# Patient Record
Sex: Male | Born: 1940 | Race: White | Hispanic: No | Smoking: Former smoker
Health system: Southern US, Community
[De-identification: ages and names within clinical notes are randomized; demographics above are authoritative.]

## PROBLEM LIST (undated history)

## (undated) DIAGNOSIS — R011 Cardiac murmur, unspecified: Secondary | ICD-10-CM

## (undated) DIAGNOSIS — J449 Chronic obstructive pulmonary disease, unspecified: Secondary | ICD-10-CM

## (undated) DIAGNOSIS — E785 Hyperlipidemia, unspecified: Secondary | ICD-10-CM

## (undated) DIAGNOSIS — E119 Type 2 diabetes mellitus without complications: Secondary | ICD-10-CM

## (undated) DIAGNOSIS — I10 Essential (primary) hypertension: Secondary | ICD-10-CM

## (undated) DIAGNOSIS — Z951 Presence of aortocoronary bypass graft: Secondary | ICD-10-CM

## (undated) DIAGNOSIS — D649 Anemia, unspecified: Secondary | ICD-10-CM

## (undated) DIAGNOSIS — I6529 Occlusion and stenosis of unspecified carotid artery: Secondary | ICD-10-CM

## (undated) DIAGNOSIS — Z862 Personal history of diseases of the blood and blood-forming organs and certain disorders involving the immune mechanism: Secondary | ICD-10-CM

## (undated) DIAGNOSIS — Z872 Personal history of diseases of the skin and subcutaneous tissue: Secondary | ICD-10-CM

## (undated) DIAGNOSIS — I2510 Atherosclerotic heart disease of native coronary artery without angina pectoris: Secondary | ICD-10-CM

## (undated) HISTORY — DX: Cardiac murmur, unspecified: R01.1

## (undated) HISTORY — DX: Hyperlipidemia, unspecified: E78.5

## (undated) HISTORY — PX: TOOTH EXTRACTION: SUR596

## (undated) HISTORY — DX: Personal history of diseases of the skin and subcutaneous tissue: Z87.2

## (undated) HISTORY — DX: Personal history of diseases of the blood and blood-forming organs and certain disorders involving the immune mechanism: Z86.2

## (undated) HISTORY — DX: Occlusion and stenosis of unspecified carotid artery: I65.29

## (undated) HISTORY — DX: Presence of aortocoronary bypass graft: Z95.1

## (undated) HISTORY — DX: Atherosclerotic heart disease of native coronary artery without angina pectoris: I25.10

## (undated) HISTORY — DX: Essential (primary) hypertension: I10

## (undated) HISTORY — DX: Chronic obstructive pulmonary disease, unspecified: J44.9

## (undated) HISTORY — DX: Anemia, unspecified: D64.9

---

## 1974-11-22 HISTORY — PX: TONSILLECTOMY: SUR1361

## 1989-11-22 HISTORY — PX: CHOLECYSTECTOMY: SHX55

## 2010-08-04 DIAGNOSIS — I6529 Occlusion and stenosis of unspecified carotid artery: Secondary | ICD-10-CM

## 2010-08-04 HISTORY — DX: Occlusion and stenosis of unspecified carotid artery: I65.29

## 2010-08-05 ENCOUNTER — Ambulatory Visit: Payer: Self-pay | Admitting: Thoracic Surgery (Cardiothoracic Vascular Surgery)

## 2010-08-05 ENCOUNTER — Inpatient Hospital Stay (HOSPITAL_COMMUNITY): Admission: RE | Admit: 2010-08-05 | Discharge: 2010-08-13 | Payer: Self-pay | Admitting: Cardiovascular Disease

## 2010-08-05 DIAGNOSIS — Z951 Presence of aortocoronary bypass graft: Secondary | ICD-10-CM | POA: Insufficient documentation

## 2010-08-07 ENCOUNTER — Encounter: Payer: Self-pay | Admitting: Thoracic Surgery (Cardiothoracic Vascular Surgery)

## 2010-08-07 DIAGNOSIS — Z951 Presence of aortocoronary bypass graft: Secondary | ICD-10-CM

## 2010-08-07 HISTORY — DX: Presence of aortocoronary bypass graft: Z95.1

## 2010-08-07 HISTORY — PX: CAROTID ENDARTERECTOMY: SUR193

## 2010-08-07 HISTORY — PX: CORONARY ARTERY BYPASS GRAFT: SHX141

## 2010-09-01 ENCOUNTER — Ambulatory Visit: Payer: Self-pay | Admitting: Vascular Surgery

## 2010-09-02 ENCOUNTER — Ambulatory Visit: Payer: Self-pay | Admitting: Surgery

## 2010-09-02 ENCOUNTER — Encounter
Admission: RE | Admit: 2010-09-02 | Discharge: 2010-09-02 | Payer: Self-pay | Admitting: Thoracic Surgery (Cardiothoracic Vascular Surgery)

## 2011-02-04 LAB — GLUCOSE, CAPILLARY
Glucose-Capillary: 112 mg/dL — ABNORMAL HIGH (ref 70–99)
Glucose-Capillary: 117 mg/dL — ABNORMAL HIGH (ref 70–99)
Glucose-Capillary: 119 mg/dL — ABNORMAL HIGH (ref 70–99)
Glucose-Capillary: 124 mg/dL — ABNORMAL HIGH (ref 70–99)
Glucose-Capillary: 127 mg/dL — ABNORMAL HIGH (ref 70–99)
Glucose-Capillary: 131 mg/dL — ABNORMAL HIGH (ref 70–99)
Glucose-Capillary: 145 mg/dL — ABNORMAL HIGH (ref 70–99)
Glucose-Capillary: 146 mg/dL — ABNORMAL HIGH (ref 70–99)
Glucose-Capillary: 167 mg/dL — ABNORMAL HIGH (ref 70–99)
Glucose-Capillary: 191 mg/dL — ABNORMAL HIGH (ref 70–99)

## 2011-02-04 LAB — BASIC METABOLIC PANEL
BUN: 10 mg/dL (ref 6–23)
BUN: 18 mg/dL (ref 6–23)
BUN: 20 mg/dL (ref 6–23)
BUN: 26 mg/dL — ABNORMAL HIGH (ref 6–23)
CO2: 25 mEq/L (ref 19–32)
CO2: 26 mEq/L (ref 19–32)
CO2: 28 mEq/L (ref 19–32)
CO2: 30 mEq/L (ref 19–32)
Calcium: 7.5 mg/dL — ABNORMAL LOW (ref 8.4–10.5)
Calcium: 7.8 mg/dL — ABNORMAL LOW (ref 8.4–10.5)
Calcium: 8.1 mg/dL — ABNORMAL LOW (ref 8.4–10.5)
Calcium: 8.1 mg/dL — ABNORMAL LOW (ref 8.4–10.5)
Chloride: 100 mEq/L (ref 96–112)
Chloride: 104 mEq/L (ref 96–112)
Chloride: 105 mEq/L (ref 96–112)
Chloride: 105 mEq/L (ref 96–112)
Chloride: 108 mEq/L (ref 96–112)
Chloride: 110 mEq/L (ref 96–112)
Creatinine, Ser: 1.11 mg/dL (ref 0.4–1.5)
Creatinine, Ser: 1.15 mg/dL (ref 0.4–1.5)
Creatinine, Ser: 1.18 mg/dL (ref 0.4–1.5)
Creatinine, Ser: 1.55 mg/dL — ABNORMAL HIGH (ref 0.4–1.5)
GFR calc Af Amer: 54 mL/min — ABNORMAL LOW (ref >60)
GFR calc Af Amer: >60 mL/min (ref >60)
GFR calc Af Amer: >60 mL/min (ref >60)
GFR calc Af Amer: >60 mL/min (ref >60)
GFR calc Af Amer: >60 mL/min (ref >60)
GFR calc non Af Amer: 45 mL/min — ABNORMAL LOW (ref >60)
GFR calc non Af Amer: >60 mL/min (ref >60)
GFR calc non Af Amer: >60 mL/min (ref >60)
GFR calc non Af Amer: >60 mL/min (ref >60)
Glucose, Bld: 117 mg/dL — ABNORMAL HIGH (ref 70–99)
Glucose, Bld: 124 mg/dL — ABNORMAL HIGH (ref 70–99)
Glucose, Bld: 141 mg/dL — ABNORMAL HIGH (ref 70–99)
Glucose, Bld: 156 mg/dL — ABNORMAL HIGH (ref 70–99)
Glucose, Bld: 159 mg/dL — ABNORMAL HIGH (ref 70–99)
Potassium: 3.8 mEq/L (ref 3.5–5.1)
Potassium: 3.8 mEq/L (ref 3.5–5.1)
Potassium: 3.9 mEq/L (ref 3.5–5.1)
Potassium: 3.9 mEq/L (ref 3.5–5.1)
Potassium: 4.2 mEq/L (ref 3.5–5.1)
Sodium: 138 mEq/L (ref 135–145)
Sodium: 139 mEq/L (ref 135–145)
Sodium: 140 mEq/L (ref 135–145)
Sodium: 140 mEq/L (ref 135–145)
Sodium: 142 mEq/L (ref 135–145)

## 2011-02-04 LAB — POCT I-STAT 4, (NA,K, GLUC, HGB,HCT)
Glucose, Bld: 101 mg/dL — ABNORMAL HIGH (ref 70–99)
Glucose, Bld: 133 mg/dL — ABNORMAL HIGH (ref 70–99)
HCT: 19 % — ABNORMAL LOW (ref 39.0–52.0)
HCT: 22 % — ABNORMAL LOW (ref 39.0–52.0)
HCT: 30 % — ABNORMAL LOW (ref 39.0–52.0)
Hemoglobin: 11.6 g/dL — ABNORMAL LOW (ref 13.0–17.0)
Hemoglobin: 6.5 g/dL — CL (ref 13.0–17.0)
Hemoglobin: 9.5 g/dL — ABNORMAL LOW (ref 13.0–17.0)
Potassium: 3.7 mEq/L (ref 3.5–5.1)
Potassium: 4.9 mEq/L (ref 3.5–5.1)
Potassium: 5.4 mEq/L — ABNORMAL HIGH (ref 3.5–5.1)
Sodium: 135 mEq/L (ref 135–145)
Sodium: 137 mEq/L (ref 135–145)
Sodium: 138 mEq/L (ref 135–145)

## 2011-02-04 LAB — CBC
HCT: 23.2 % — ABNORMAL LOW (ref 39.0–52.0)
HCT: 24.8 % — ABNORMAL LOW (ref 39.0–52.0)
HCT: 25.3 % — ABNORMAL LOW (ref 39.0–52.0)
HCT: 25.7 % — ABNORMAL LOW (ref 39.0–52.0)
HCT: 26.7 % — ABNORMAL LOW (ref 39.0–52.0)
HCT: 35 % — ABNORMAL LOW (ref 39.0–52.0)
HCT: 36.3 % — ABNORMAL LOW (ref 39.0–52.0)
Hemoglobin: 12 g/dL — ABNORMAL LOW (ref 13.0–17.0)
Hemoglobin: 12.6 g/dL — ABNORMAL LOW (ref 13.0–17.0)
Hemoglobin: 7.8 g/dL — ABNORMAL LOW (ref 13.0–17.0)
Hemoglobin: 8.7 g/dL — ABNORMAL LOW (ref 13.0–17.0)
Hemoglobin: 8.7 g/dL — ABNORMAL LOW (ref 13.0–17.0)
Hemoglobin: 8.8 g/dL — ABNORMAL LOW (ref 13.0–17.0)
Hemoglobin: 9.2 g/dL — ABNORMAL LOW (ref 13.0–17.0)
MCH: 31 pg (ref 26.0–34.0)
MCH: 31.2 pg (ref 26.0–34.0)
MCH: 31.2 pg (ref 26.0–34.0)
MCH: 31.7 pg (ref 26.0–34.0)
MCH: 31.9 pg (ref 26.0–34.0)
MCHC: 33.6 g/dL (ref 30.0–36.0)
MCHC: 33.9 g/dL (ref 30.0–36.0)
MCHC: 34.3 g/dL (ref 30.0–36.0)
MCHC: 34.4 g/dL (ref 30.0–36.0)
MCHC: 34.5 g/dL (ref 30.0–36.0)
MCHC: 35 g/dL (ref 30.0–36.0)
MCV: 89.1 fL (ref 78.0–100.0)
MCV: 90.9 fL (ref 78.0–100.0)
MCV: 91.9 fL (ref 78.0–100.0)
MCV: 92.1 fL (ref 78.0–100.0)
MCV: 92.1 fL (ref 78.0–100.0)
MCV: 92.6 fL (ref 78.0–100.0)
MCV: 92.7 fL (ref 78.0–100.0)
Platelets: 113 10*3/uL — ABNORMAL LOW (ref 150–400)
Platelets: 125 10*3/uL — ABNORMAL LOW (ref 150–400)
Platelets: 132 10*3/uL — ABNORMAL LOW (ref 150–400)
Platelets: 154 10*3/uL (ref 150–400)
Platelets: 164 10*3/uL (ref 150–400)
Platelets: 177 10*3/uL (ref 150–400)
Platelets: 183 10*3/uL (ref 150–400)
RBC: 2.52 MIL/uL — ABNORMAL LOW (ref 4.22–5.81)
RBC: 2.63 MIL/uL — ABNORMAL LOW (ref 4.22–5.81)
RBC: 2.73 MIL/uL — ABNORMAL LOW (ref 4.22–5.81)
RBC: 2.9 MIL/uL — ABNORMAL LOW (ref 4.22–5.81)
RBC: 3.81 MIL/uL — ABNORMAL LOW (ref 4.22–5.81)
RBC: 3.92 MIL/uL — ABNORMAL LOW (ref 4.22–5.81)
RDW: 13.8 % (ref 11.5–15.5)
RDW: 14.3 % (ref 11.5–15.5)
RDW: 14.3 % (ref 11.5–15.5)
RDW: 14.5 % (ref 11.5–15.5)
RDW: 14.6 % (ref 11.5–15.5)
RDW: 15 % (ref 11.5–15.5)
WBC: 10.1 10*3/uL (ref 4.0–10.5)
WBC: 10.6 10*3/uL — ABNORMAL HIGH (ref 4.0–10.5)
WBC: 12.8 10*3/uL — ABNORMAL HIGH (ref 4.0–10.5)
WBC: 6.5 10*3/uL (ref 4.0–10.5)
WBC: 7.5 10*3/uL (ref 4.0–10.5)
WBC: 9.1 10*3/uL (ref 4.0–10.5)

## 2011-02-04 LAB — PLATELET COUNT: Platelets: 95 10*3/uL — ABNORMAL LOW (ref 150–400)

## 2011-02-04 LAB — POCT I-STAT 3, ART BLOOD GAS (G3+)
Acid-base deficit: 1 mmol/L (ref 0.0–2.0)
Bicarbonate: 22.1 mEq/L (ref 20.0–24.0)
Bicarbonate: 22.6 mEq/L (ref 20.0–24.0)
Bicarbonate: 24.7 mEq/L — ABNORMAL HIGH (ref 20.0–24.0)
O2 Saturation: 99 %
Patient temperature: 35.2
TCO2: 23 mmol/L (ref 0–100)
TCO2: 24 mmol/L (ref 0–100)
TCO2: 26 mmol/L (ref 0–100)
pCO2 arterial: 44.1 mmHg (ref 35.0–45.0)
pH, Arterial: 7.312 — ABNORMAL LOW (ref 7.350–7.450)
pH, Arterial: 7.452 — ABNORMAL HIGH (ref 7.350–7.450)
pH, Arterial: 7.456 — ABNORMAL HIGH (ref 7.350–7.450)
pO2, Arterial: 116 mmHg — ABNORMAL HIGH (ref 80.0–100.0)
pO2, Arterial: 295 mmHg — ABNORMAL HIGH (ref 80.0–100.0)

## 2011-02-04 LAB — TYPE AND SCREEN: Antibody Screen: NEGATIVE

## 2011-02-04 LAB — ABO/RH: ABO/RH(D): A NEG

## 2011-02-04 LAB — BLOOD GAS, ARTERIAL
FIO2: 0.21 %
pCO2 arterial: 37.2 mmHg (ref 35.0–45.0)
pH, Arterial: 7.44 (ref 7.350–7.450)

## 2011-02-04 LAB — POCT I-STAT, CHEM 8
Creatinine, Ser: 1.1 mg/dL (ref 0.4–1.5)
Glucose, Bld: 143 mg/dL — ABNORMAL HIGH (ref 70–99)
HCT: 24 % — ABNORMAL LOW (ref 39.0–52.0)
Hemoglobin: 8.2 g/dL — ABNORMAL LOW (ref 13.0–17.0)
Potassium: 4.6 mEq/L (ref 3.5–5.1)
TCO2: 24 mmol/L (ref 0–100)

## 2011-02-04 LAB — HEMOGLOBIN AND HEMATOCRIT, BLOOD: HCT: 18.5 % — ABNORMAL LOW (ref 39.0–52.0)

## 2011-02-04 LAB — PREPARE PLATELETS

## 2011-02-04 LAB — URINALYSIS, DIPSTICK ONLY
Glucose, UA: NEGATIVE mg/dL
Leukocytes, UA: NEGATIVE
Specific Gravity, Urine: 1.005 (ref 1.005–1.030)
pH: 6.5 (ref 5.0–8.0)

## 2011-02-04 LAB — CREATININE, SERUM
Creatinine, Ser: 1.19 mg/dL (ref 0.4–1.5)
GFR calc Af Amer: >60 mL/min (ref >60)
GFR calc non Af Amer: >60 mL/min (ref >60)

## 2011-02-04 LAB — COMPREHENSIVE METABOLIC PANEL
Albumin: 3.9 g/dL (ref 3.5–5.2)
Alkaline Phosphatase: 53 U/L (ref 39–117)
BUN: 10 mg/dL (ref 6–23)
Chloride: 104 mEq/L (ref 96–112)
Potassium: 3.9 mEq/L (ref 3.5–5.1)
Total Bilirubin: 0.7 mg/dL (ref 0.3–1.2)

## 2011-02-04 LAB — MAGNESIUM
Magnesium: 1.9 mg/dL (ref 1.5–2.5)
Magnesium: 1.9 mg/dL (ref 1.5–2.5)

## 2011-02-04 LAB — PROTIME-INR: Prothrombin Time: 14.5 seconds (ref 11.6–15.2)

## 2011-03-02 ENCOUNTER — Ambulatory Visit (INDEPENDENT_AMBULATORY_CARE_PROVIDER_SITE_OTHER): Payer: Medicare Other | Admitting: Vascular Surgery

## 2011-03-02 ENCOUNTER — Other Ambulatory Visit (INDEPENDENT_AMBULATORY_CARE_PROVIDER_SITE_OTHER): Payer: Medicare Other

## 2011-03-02 DIAGNOSIS — I6529 Occlusion and stenosis of unspecified carotid artery: Secondary | ICD-10-CM

## 2011-03-02 DIAGNOSIS — Z48812 Encounter for surgical aftercare following surgery on the circulatory system: Secondary | ICD-10-CM

## 2011-03-02 NOTE — Assessment & Plan Note (Signed)
OFFICE VISIT  Thomas Gray, Thomas Gray DOB:  December 28, 1940                                       03/02/2011 ZHYQM#:[id number]  Patient presents today for follow-up of his left carotid endarterectomy combined with coronary artery bypass grafting by Dr. Dorris Fetch on 03/07/10.  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  He fortunately has done extremely well since the surgery and fortunately had quit smoking the day of his surgery.  He has done well with cardiac rehab and is returning to his baseline.  He does have some swelling in his left leg after the vein harvest, but this is mild intolerable.  He specifically denies any neurologic deficits, no amaurosis fugax, transient ischemic attack, or stroke.  He does have a history of hypertension.  He is single, is a Academic librarian.  Does not drink alcohol.  REVIEW OF SYSTEMS:  Negative except for HPI.  PHYSICAL EXAMINATION:  A well-developed and well-nourished white male appearing his stated age in no acute distress.  Blood pressure is 150/66, pulse 61, respirations 16, saturation is 98% on room air.  HEENT is normal.  His left carotid incision is well-healed without bruits.  He has no bruit in the right.  His radial pulses are 2+ bilaterally.  He underwent repeat carotid duplex evaluation at our office, revealing a widely patent endarterectomy with no evidence of recurrent stenosis.  He does have moderate 40% to 59% stenosis in his right carotid system.  I have reassured patient regarding this.  We will see him again in 6 months with continued surveillance of his endarterectomy and his asymptomatic carotid stenosis.  He will notify us should he develop any neurologic deficits.    Larina Earthly, M.D. Electronically Signed  TFE/MEDQ  D:  03/02/2011  T:  03/02/2011  Job:  [id number]  cc:   Salvatore Decent. Dorris Fetch, M.D. Richard A. Alanda Amass, M.D.

## 2011-03-16 NOTE — Procedures (Unsigned)
CAROTID DUPLEX EXAM  INDICATION:  Followup left CEA performed 08/07/2010.  HISTORY: Diabetes: Cardiac:  Yes. Hypertension:  Yes. Smoking:  Yes. Previous Surgery:  Left CEA. CV History: Amaurosis Fugax No, Paresthesias No, Hemiparesis No                                      RIGHT             LEFT Brachial systolic pressure:         160               140 Brachial Doppler waveforms:         WNL               WNL Vertebral direction of flow:        Antegrade         Antegrade DUPLEX VELOCITIES (cm/sec) CCA peak systolic                   64                74 ECA peak systolic                   153               231 ICA peak systolic                   165               50 ICA end diastolic                   56                15 PLAQUE MORPHOLOGY:                  Heterogeneous     NA PLAQUE AMOUNT:                      Moderate          Mild PLAQUE LOCATION:                    ICA               Subclavian  IMPRESSION: 1. Widely patent left carotid endarterectomy without hyperplasia or     restenosis. 2. 40%-59% stenosis right internal carotid artery stenosis. 3. Left external carotid artery velocities are elevated likely due to     post surgical changes. 4. 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 5. Bilateral vertebral arteries are within normal limits.  ___________________________________________ Larina Earthly, M.D.  LT/MEDQ  D:  03/02/2011  T:  03/02/2011  Job:  [id number]

## 2011-04-06 NOTE — Assessment & Plan Note (Signed)
OFFICE VISIT   CLAUDELL, WOHLER  DOB:  [birth year]                                       09/01/2010  ZOXWR#:[id number]   Patient presents today for follow-up of his left carotid endarterectomy  on 09/16 done in conjunction with coronary artery bypass grafting by Dr.  Charlett Lango.  He did well in the hospital and continues to do  well post his discharge.  His main complaints today are some soreness  from his vein harvest incision in his leg and mild soreness in his  chest.  He does not have any soreness in his left neck.  He does have  the usual peri-incisional numbness.   His neck incision is well-healed.  He is grossly intact neurologically.  He has no carotid bruits.   I am quite pleased with his initial follow-up.  Plan to see him again in  6 months with repeat carotid duplex.     Larina Earthly, M.D.  Electronically Signed   TFE/MEDQ  D:  09/01/2010  T:  09/02/2010  Job:  [id number]   cc:   Salvatore Decent. Dorris Fetch, M.D.  Richard A. Alanda Amass, M.D.

## 2011-04-06 NOTE — Assessment & Plan Note (Signed)
OFFICE VISIT   JAHSIAH, CARPENTER  DOB:  09-27-1941                                        September 02, 2010  CHART #:  [id number]   HISTORY:  The patient is a 70 year old gentleman who underwent coronary  bypass grafting x4 on August 07, 2010.  He has a longstanding history  of heart disease.  He was complaining of early fatigue.  A Cardiolite  study was positive and at catheterization, it was found to have left  main and three-vessel disease.  He also had bilateral carotid stenoses  and underwent a concomitant left carotid endarterectomy by Dr. Gretta Began.  His postoperative course was remarkable for developing a  hematoma at his endarterectomy site that has subsequently resolved and  not been a problem.  He also was discharged to a skilled nursing  facility and was still on 2 L of nasal cannula oxygen at that time.  He  subsequently weaned off the oxygen.  He did say when he was at the  skilled nursing facility he was taking Combivent 4 times a day and that  caused a lot of irritation to him.  He is now on Advair and not using  Combivent at all.  He has not had any anginal-type chest pain.  He is  walking about a quarter of a mile twice daily.  He is only taking one  Ultram since he left the hospital and is not even really requiring  Tylenol at this point in time.   CURRENT MEDICATIONS:  1. Advair 250/50 one puff b.i.d.  2. Lopressor 100 mg b.i.d.  3. NicoDerm CQ patches.  4. Klor-Con 10 mEq daily.  5. Enteric-coated aspirin 81 mg daily.  6. Hydrochlorothiazide 25 mg daily.  7. Lipitor 20 mg nightly.  8. Plaquenil 100 mg daily.  9. Prilosec 40 mg daily.  10.Cozaar 25 mg daily.   ALLERGIES:  He has no known drug allergies.   PHYSICAL EXAMINATION:  General:  The patient is a 70 year old gentleman,  in no acute distress.  Vital Signs:  Blood pressure is 137/83, pulse 81,  respirations are 18, and his oxygen saturation is 100% on room air.  Lungs:   Diminished breath sounds at the left base, otherwise clear.  Cardiac:  Regular rate and rhythm.  Normal S1 and S2.  No rubs, murmurs,  or gallops.  Chest:  Sternum is stable.  Sternal incision is clean, dry,  and intact.  Extremities:  His leg incision is healing well.  He does  have a seroma between the knee and the incision in the lower leg.  He  does have some tenderness along that, but there is no erythema or  warmth.   DIAGNOSTIC TESTS:  Chest x-ray shows possible slight elevation of the  left hemidiaphragm, but otherwise unremarkable.   IMPRESSION:  The patient is doing very well at this point in time.  His  chief complaint currently is pain in his leg at the incision near the  knee and then over the area of the seroma, it is not there all the time,  but is there enough to causing some problems, but he has not been having  to take any narcotics for it.  He did have apparently a duplex at Dr.  Kandis Cocking office, which showed no evidence of deep venous thrombosis.  I encouraged him to continue to increase his activities, but slowly and  gradually over time.  I recommended that he can start driving a car next  week when he starts cardiac rehab, but I advised him to limit himself to  short trips around the town, avoid high speeds and heavy traffic, and  not to drive on any trips over 15 minutes for the first month or so.  He  is not to lift any objects that weigh greater than 10 pounds for another  2 weeks and nothing over 20 pounds for another 4 weeks; after that, his  activities are unrestricted.  He will continue to follow with Dr.  Susa Griffins.  I will be happy to see him back anytime if I can be  of any further assistance with his care.  He knows to call immediately  if he develops any redness or increasing pain in his leg.   Salvatore Decent Dorris Fetch, M.D.  Electronically Signed   SCH/MEDQ  D:  09/02/2010  T:  09/03/2010  Job:  [id number]   cc:   Gerlene Burdock A. Alanda Amass, M.D.

## 2011-07-12 IMAGING — CR DG CHEST 2V
2 series · 2 of 2 positions shown · non-contrast
Comparison: None.

CLINICAL DATA: Pre CABG, hypertension, smoking history

CHEST - 2 VIEW

[w chest pa]
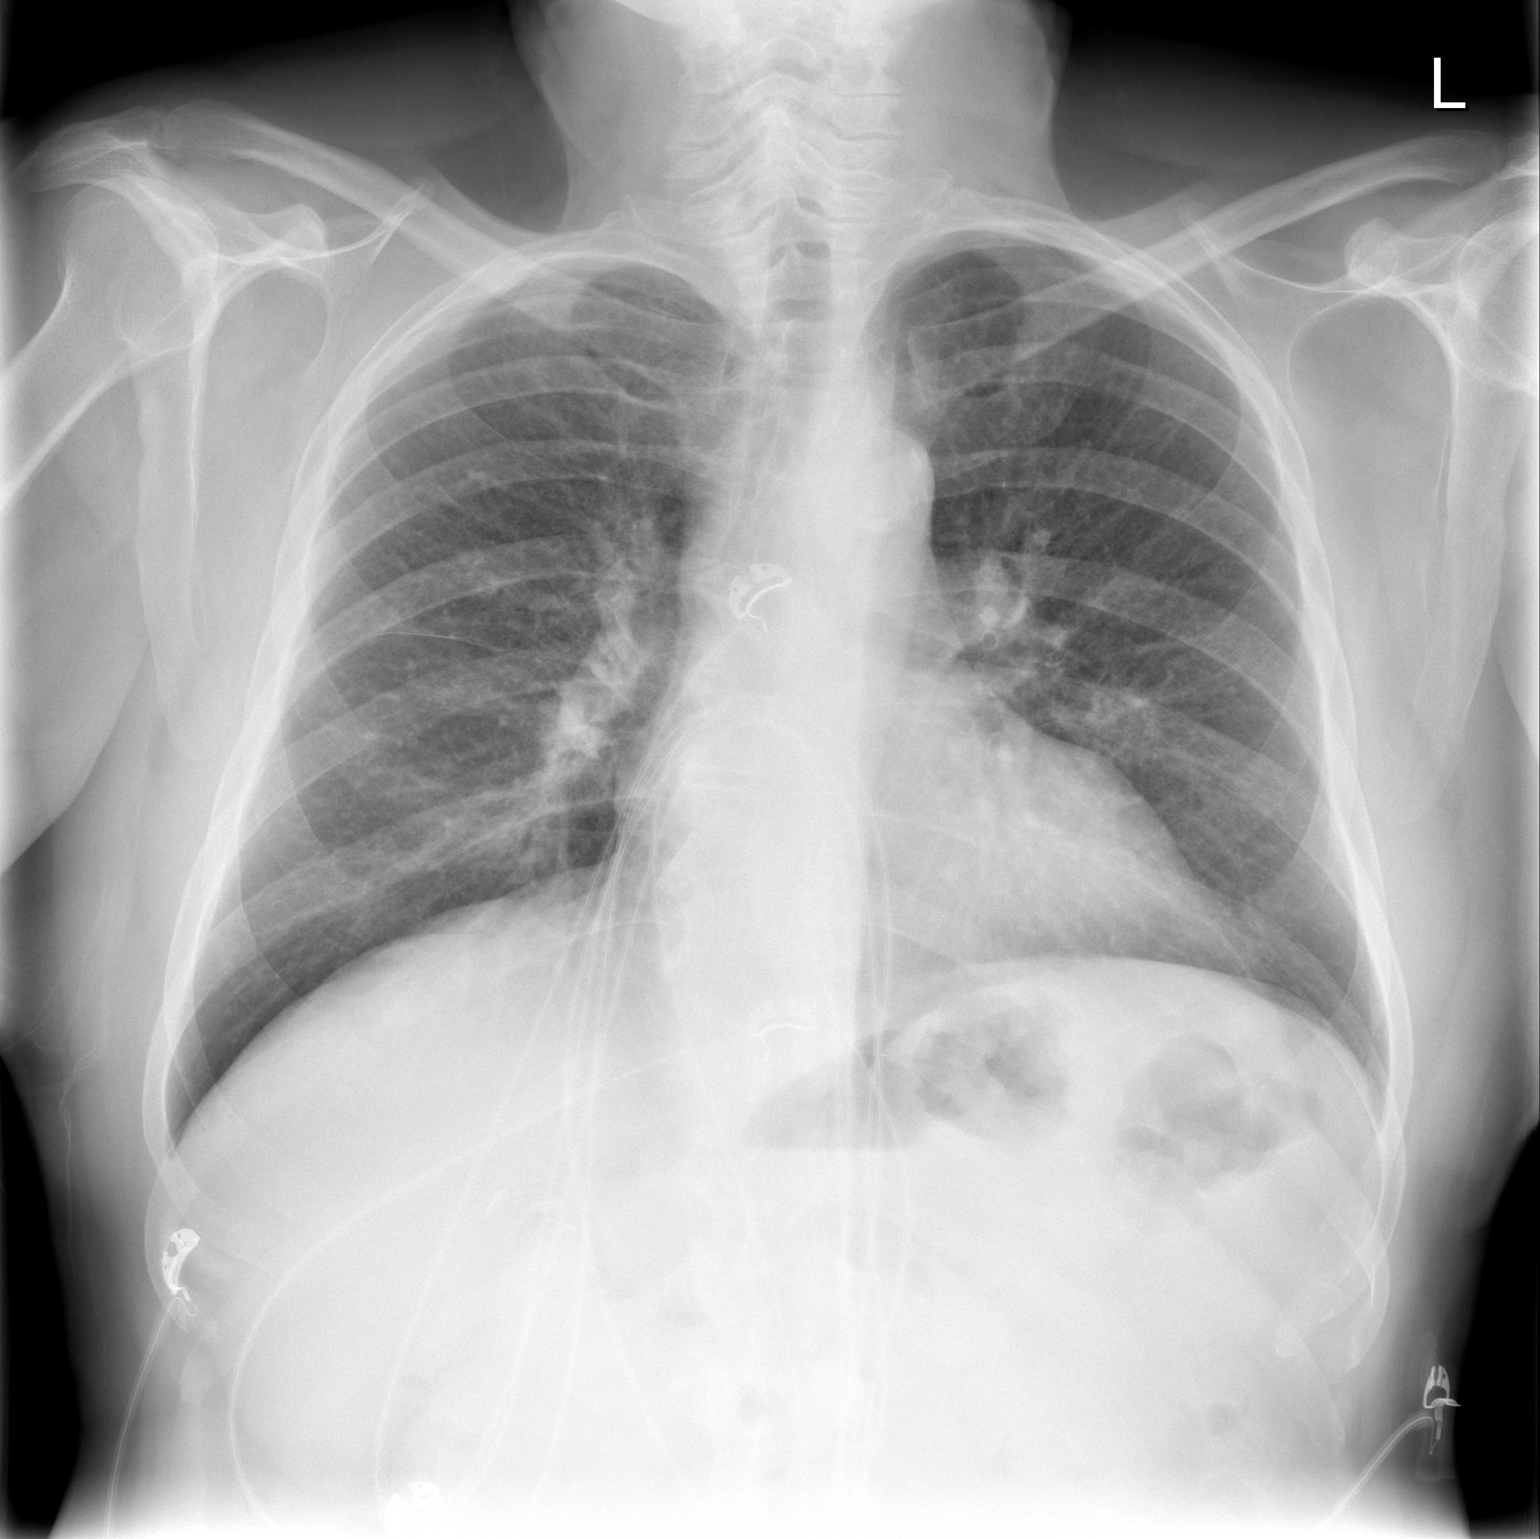

[w chest lat]
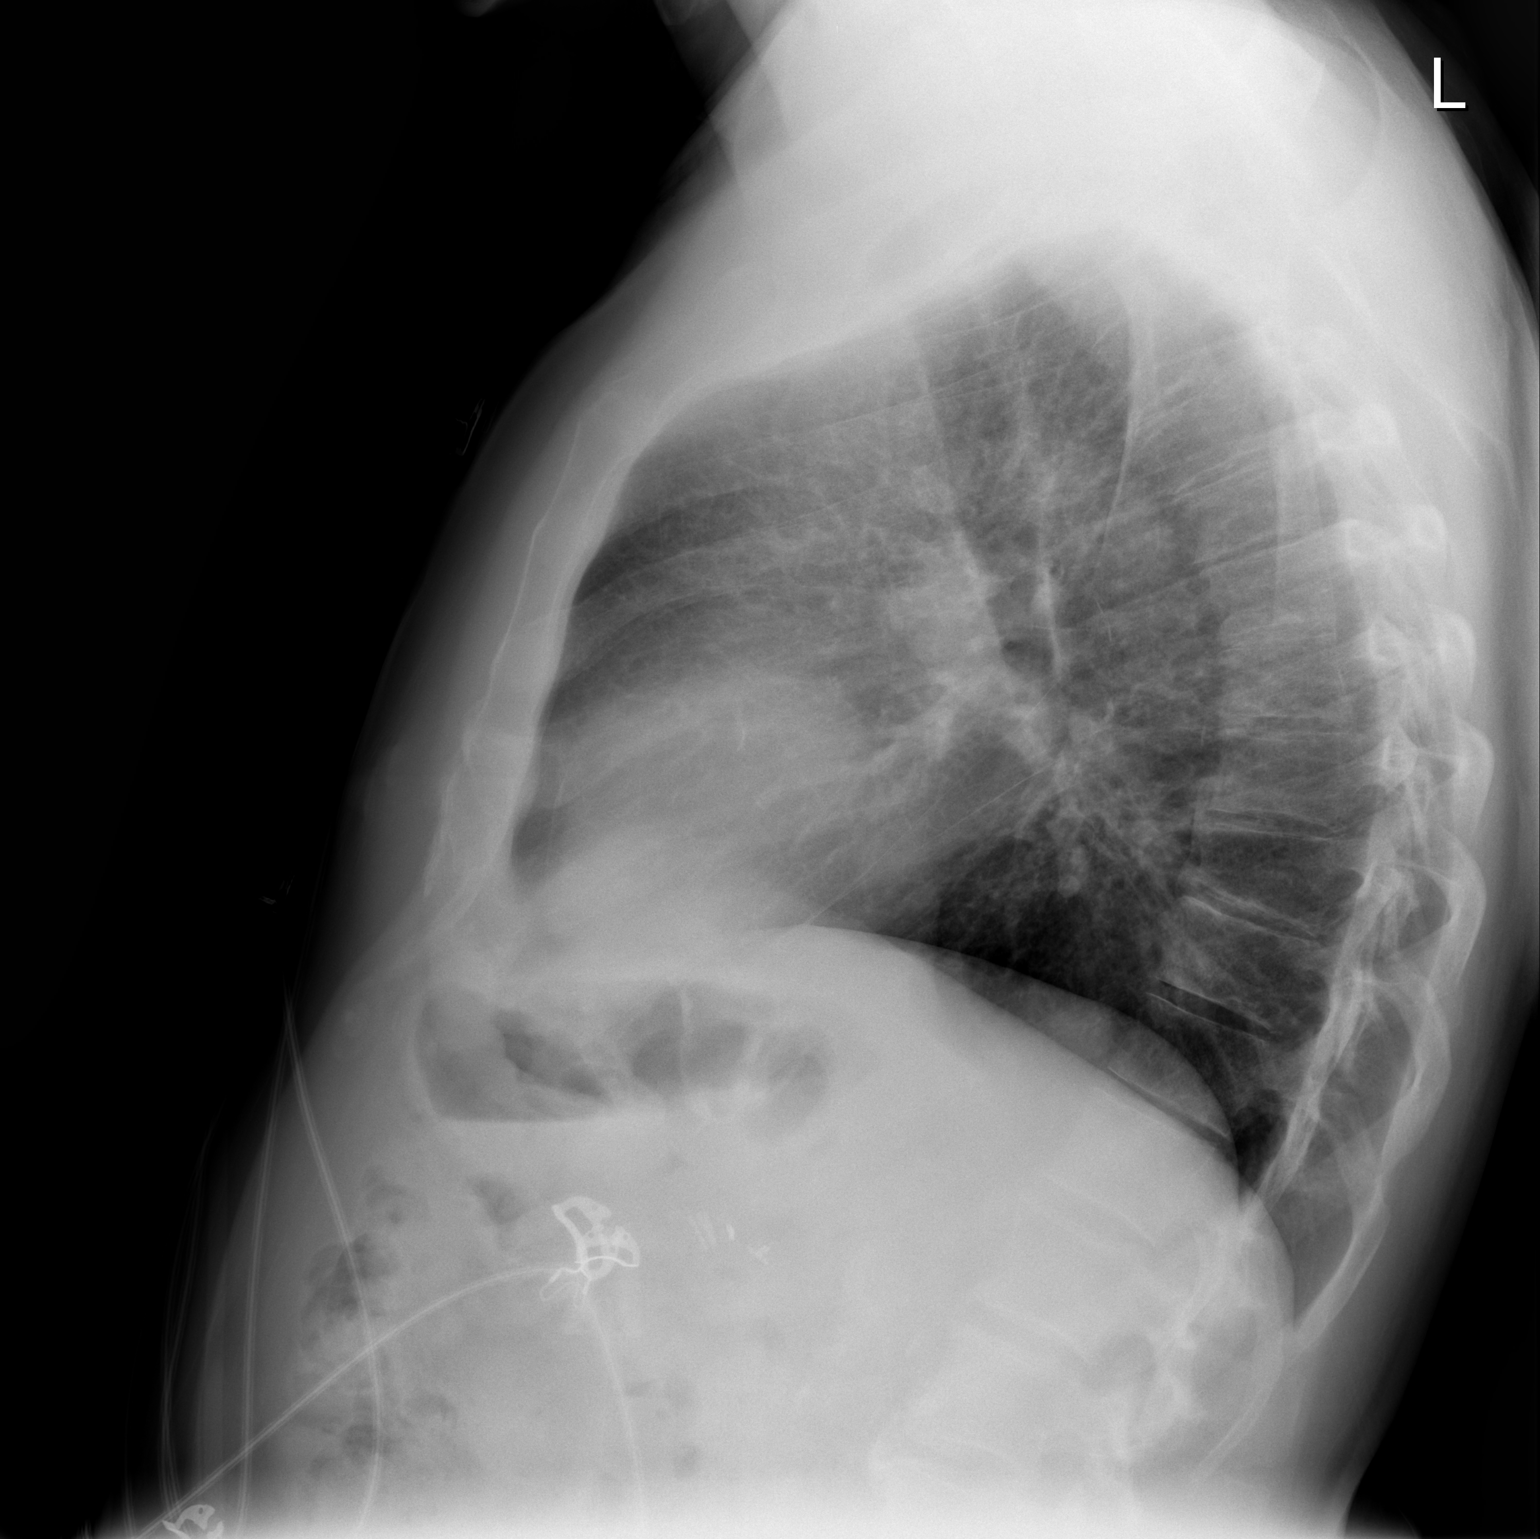

[2 of 2 positions shown; findings below may reference images not displayed]

FINDINGS: No active infiltrate or effusion is seen.  There is
peribronchial thickening consistent with bronchitis.  Mediastinal
contours are normal.  The heart is within normal limits in size.
There is some degenerative change in the lower thoracic spine.
IMPRESSION: Peribronchial thickening consistent with bronchitis.  No pneumonia.

## 2011-07-13 IMAGING — CR DG CHEST 1V PORT
1 series · 1 of 1 positions shown · non-contrast
Comparison: 08/06/2010

CLINICAL DATA: Status post CABG.

PORTABLE CHEST - 1 VIEW

[view not recorded]
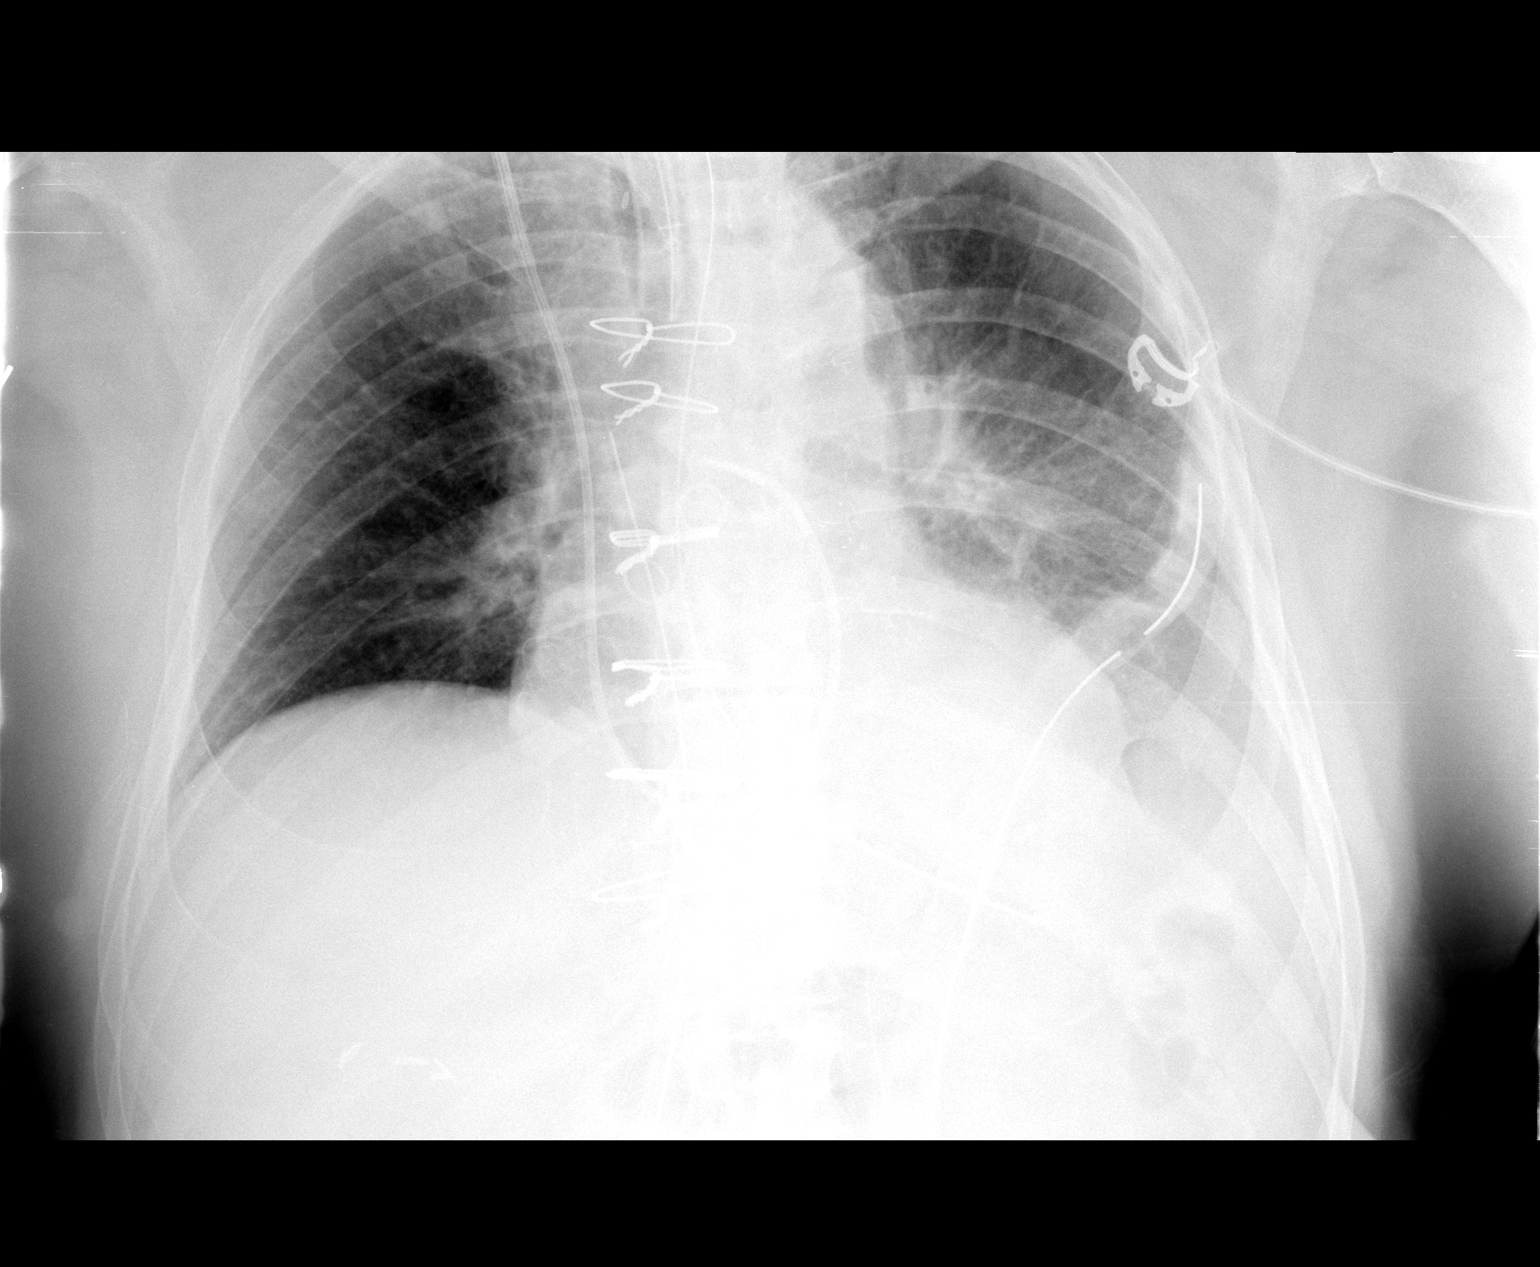

[1 of 1 positions shown; findings below may reference images not displayed]

FINDINGS: Single view of the chest demonstrates an endotracheal
tube 2.5 cm above the carina.  Nasogastric tube in the upper
stomach.  There is a mediastinal and left chest drains.  Densities
at the left lung base are suggestive for pleural fluid and volume
loss.  Swan-Ganz catheter in the right main pulmonary artery.  No
evidence for a large pneumothorax.
IMPRESSION: Expected postoperative changes.

Left basilar densities most compatible with pleural fluid and
volume loss.

Negative for a large pneumothorax.

## 2011-07-14 IMAGING — CR DG CHEST 1V PORT
1 series · 1 of 1 positions shown · non-contrast
Comparison: Yesterday

CLINICAL DATA: Chest pain

PORTABLE CHEST - 1 VIEW

[view not recorded]
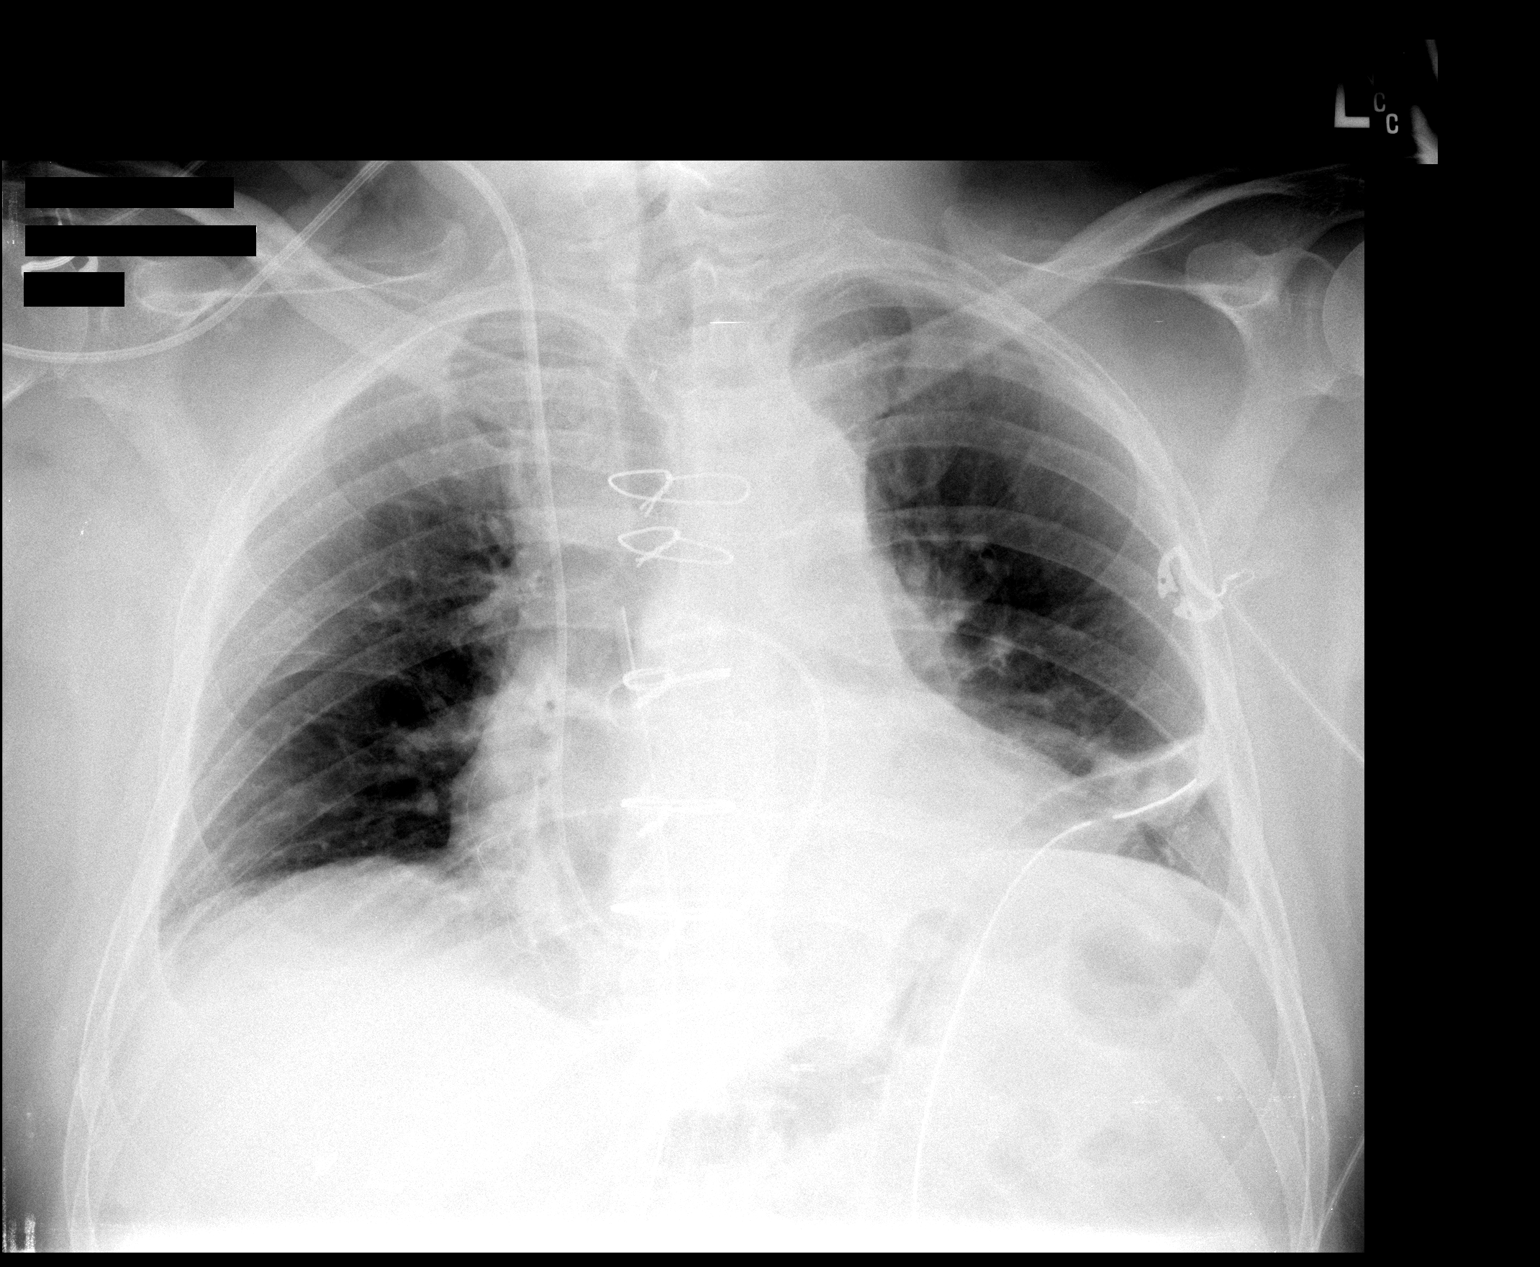

[1 of 1 positions shown; findings below may reference images not displayed]

FINDINGS: Endotracheal and NG tubes have been removed.  Stable
chest tube.  No pneumothorax.  Improved left basilar airspace
disease.  Stable left chest tube.
IMPRESSION: Endotracheal and NG tube removal.  Improved left basilar airspace
disease.  No pneumothorax.

## 2011-08-04 ENCOUNTER — Encounter: Payer: Self-pay | Admitting: Vascular Surgery

## 2011-08-31 ENCOUNTER — Ambulatory Visit: Payer: Medicare Other | Admitting: Vascular Surgery

## 2011-08-31 ENCOUNTER — Other Ambulatory Visit: Payer: Medicare Other

## 2011-09-20 ENCOUNTER — Encounter: Payer: Self-pay | Admitting: Vascular Surgery

## 2011-09-21 ENCOUNTER — Encounter: Payer: Self-pay | Admitting: Vascular Surgery

## 2011-09-21 ENCOUNTER — Ambulatory Visit (INDEPENDENT_AMBULATORY_CARE_PROVIDER_SITE_OTHER): Payer: Medicare Other | Admitting: Vascular Surgery

## 2011-09-21 VITALS — BP 168/72 | HR 67 | Resp 16 | Ht 67.0 in | Wt 176.0 lb

## 2011-09-21 DIAGNOSIS — Z48812 Encounter for surgical aftercare following surgery on the circulatory system: Secondary | ICD-10-CM

## 2011-09-21 DIAGNOSIS — I6529 Occlusion and stenosis of unspecified carotid artery: Secondary | ICD-10-CM

## 2011-09-21 NOTE — Progress Notes (Signed)
The patient presents today for followup of his left carotid surgery and endarterectomy in September of 2011. He denies any neurologic deficits specifically no amaurosis fugax transient ischemic attack, or stroke. He has had no new major medical difficulties. He does have a history of prior coronary artery disease with coronary bypass grafting. He is a reformed smoker.  Past Medical History  Diagnosis Date  . Hypertension   . Hyperlipidemia   . Carotid artery occlusion   . CAD (coronary artery disease)   . COPD (chronic obstructive pulmonary disease)     History  Substance Use Topics  . Smoking status: Former Smoker -- 1.0 packs/day    Types: Cigarettes    Quit date: 08/07/2010  . Smokeless tobacco: Not on file  . Alcohol Use: Yes     1 glass of wine weekly    Family History  Problem Relation Age of Onset  . Hypertension Mother   . Diabetes Mother     No Known Allergies  Current outpatient prescriptions:aspirin EC 81 MG tablet, Take 81 mg by mouth daily.  , Disp: , Rfl: ;  atorvastatin (LIPITOR) 20 MG tablet, Take 20 mg by mouth daily.  , Disp: , Rfl: ;  hydrochlorothiazide 25 MG tablet, Take 25 mg by mouth daily.  , Disp: , Rfl: ;  losartan (COZAAR) 50 MG tablet, Take 100 mg by mouth daily. , Disp: , Rfl: ;  metoprolol (LOPRESSOR) 50 MG tablet, Take 50 mg by mouth 2 (two) times daily.  , Disp: , Rfl:  omeprazole (PRILOSEC) 40 MG capsule, Take 40 mg by mouth daily.  , Disp: , Rfl: ;  potassium chloride (KLOR-CON) 10 MEQ CR tablet, Take 10 mEq by mouth daily.  , Disp: , Rfl: ;  Fluticasone-Salmeterol (ADVAIR) 250-50 MCG/DOSE AEPB, Inhale 1 puff into the lungs 2 (two) times daily.  , Disp: , Rfl: ;  Hydroxychloroquine Sulfate (PLAQUENIL PO), Take 100 mg by mouth daily.  , Disp: , Rfl:  nicotine (NICODERM CQ - DOSED IN MG/24 HOURS) 21 mg/24hr patch, Place 1 patch onto the skin daily.  , Disp: , Rfl:   BP 168/72  Pulse 67  Resp 16  Ht 5\' 7"  (1.702 m)  Wt 176 lb (79.833 kg)  BMI 27.57  kg/m2  Body mass index is 27.57 kg/(m^2).       Physical exam: Well-developed well-nourished white male in no acute distress. Well-healed left neck incision with no bruits bilaterally. Grossly intact neurologically. Heart regular rate and rhythm. 2+ radial pulses bilaterally. Chest clear bilaterally.  Carotid duplex: Widely patent left endarterectomy. Moderate 40-59% stenosis in his right internal carotid artery. This is unchanged.  Impression and plan: stable status post left carotid endarterectomy. Continue usual activities and continue postoperative surveillance in her noninvasive lab

## 2011-09-30 ENCOUNTER — Encounter: Payer: Self-pay | Admitting: Vascular Surgery

## 2011-10-06 NOTE — Procedures (Unsigned)
CAROTID DUPLEX EXAM  INDICATION:  Follow up carotid stenosis.  HISTORY: Diabetes:  No. Cardiac:  CAD. Hypertension:  Yes. Smoking:  Previous. Previous Surgery:  Left carotid endarterectomy on 08/07/2010. CV History:  Asymptomatic. Amaurosis Fugax No, Paresthesias No, Hemiparesis No.                                      RIGHT             LEFT Brachial systolic pressure:         174               168 Brachial Doppler waveforms:         WNL               WNL Vertebral direction of flow:        Antegrade         Antegrade DUPLEX VELOCITIES (cm/sec) CCA peak systolic                   87                88 ECA peak systolic                   107               184 ICA peak systolic                   181               101 ICA end diastolic                   44                24 PLAQUE MORPHOLOGY:                  Calcified         Heterogenous PLAQUE AMOUNT:                      Moderate          Mild PLAQUE LOCATION:                    CCA/ICA           ECA  IMPRESSION: 1. Right internal carotid artery stenosis present in the 40% to 59%     range. 2. Left internal carotid artery is patent with history of     endarterectomy.  No evidence of hyperplasia or plaque is     identified. 3. Left external carotid artery stenosis is present with heterogenous     plaque in the proximal segment noted. 4. Essentially unchanged since previous study on 03/02/2011.  ___________________________________________ Larina Earthly, M.D.  SH/MEDQ  D:  09/21/2011  T:  09/21/2011  Job:  [id number]

## 2011-11-25 DIAGNOSIS — Z5189 Encounter for other specified aftercare: Secondary | ICD-10-CM | POA: Diagnosis not present

## 2011-11-25 DIAGNOSIS — Z951 Presence of aortocoronary bypass graft: Secondary | ICD-10-CM | POA: Diagnosis not present

## 2011-12-10 DIAGNOSIS — Z5181 Encounter for therapeutic drug level monitoring: Secondary | ICD-10-CM | POA: Diagnosis not present

## 2011-12-10 DIAGNOSIS — R5381 Other malaise: Secondary | ICD-10-CM | POA: Diagnosis not present

## 2011-12-10 DIAGNOSIS — Z79899 Other long term (current) drug therapy: Secondary | ICD-10-CM | POA: Diagnosis not present

## 2011-12-14 DIAGNOSIS — I739 Peripheral vascular disease, unspecified: Secondary | ICD-10-CM | POA: Diagnosis not present

## 2011-12-14 DIAGNOSIS — I2510 Atherosclerotic heart disease of native coronary artery without angina pectoris: Secondary | ICD-10-CM | POA: Diagnosis not present

## 2011-12-14 DIAGNOSIS — I10 Essential (primary) hypertension: Secondary | ICD-10-CM | POA: Diagnosis not present

## 2011-12-14 DIAGNOSIS — E782 Mixed hyperlipidemia: Secondary | ICD-10-CM | POA: Diagnosis not present

## 2011-12-17 DIAGNOSIS — D649 Anemia, unspecified: Secondary | ICD-10-CM | POA: Diagnosis not present

## 2011-12-17 DIAGNOSIS — R195 Other fecal abnormalities: Secondary | ICD-10-CM | POA: Diagnosis not present

## 2011-12-17 DIAGNOSIS — D689 Coagulation defect, unspecified: Secondary | ICD-10-CM | POA: Diagnosis not present

## 2011-12-22 DIAGNOSIS — J0100 Acute maxillary sinusitis, unspecified: Secondary | ICD-10-CM | POA: Diagnosis not present

## 2011-12-29 DIAGNOSIS — I2510 Atherosclerotic heart disease of native coronary artery without angina pectoris: Secondary | ICD-10-CM | POA: Diagnosis not present

## 2011-12-29 DIAGNOSIS — Z5189 Encounter for other specified aftercare: Secondary | ICD-10-CM | POA: Diagnosis not present

## 2011-12-29 DIAGNOSIS — E782 Mixed hyperlipidemia: Secondary | ICD-10-CM | POA: Diagnosis not present

## 2011-12-29 DIAGNOSIS — Z951 Presence of aortocoronary bypass graft: Secondary | ICD-10-CM | POA: Diagnosis not present

## 2011-12-29 DIAGNOSIS — I10 Essential (primary) hypertension: Secondary | ICD-10-CM | POA: Diagnosis not present

## 2012-01-25 DIAGNOSIS — Z951 Presence of aortocoronary bypass graft: Secondary | ICD-10-CM | POA: Diagnosis not present

## 2012-01-25 DIAGNOSIS — Z5189 Encounter for other specified aftercare: Secondary | ICD-10-CM | POA: Diagnosis not present

## 2012-02-21 DIAGNOSIS — Z5189 Encounter for other specified aftercare: Secondary | ICD-10-CM | POA: Diagnosis not present

## 2012-02-21 DIAGNOSIS — Z951 Presence of aortocoronary bypass graft: Secondary | ICD-10-CM | POA: Diagnosis not present

## 2012-03-22 DIAGNOSIS — Z951 Presence of aortocoronary bypass graft: Secondary | ICD-10-CM | POA: Diagnosis not present

## 2012-03-22 DIAGNOSIS — Z5189 Encounter for other specified aftercare: Secondary | ICD-10-CM | POA: Diagnosis not present

## 2012-04-24 DIAGNOSIS — Z5189 Encounter for other specified aftercare: Secondary | ICD-10-CM | POA: Diagnosis not present

## 2012-04-24 DIAGNOSIS — Z951 Presence of aortocoronary bypass graft: Secondary | ICD-10-CM | POA: Diagnosis not present

## 2012-05-22 DIAGNOSIS — Z951 Presence of aortocoronary bypass graft: Secondary | ICD-10-CM | POA: Diagnosis not present

## 2012-05-22 DIAGNOSIS — I10 Essential (primary) hypertension: Secondary | ICD-10-CM | POA: Diagnosis not present

## 2012-05-22 DIAGNOSIS — Z5189 Encounter for other specified aftercare: Secondary | ICD-10-CM | POA: Diagnosis not present

## 2012-05-22 DIAGNOSIS — I2510 Atherosclerotic heart disease of native coronary artery without angina pectoris: Secondary | ICD-10-CM | POA: Diagnosis not present

## 2012-05-22 DIAGNOSIS — E782 Mixed hyperlipidemia: Secondary | ICD-10-CM | POA: Diagnosis not present

## 2012-05-22 DIAGNOSIS — D649 Anemia, unspecified: Secondary | ICD-10-CM | POA: Diagnosis not present

## 2012-05-23 DIAGNOSIS — D649 Anemia, unspecified: Secondary | ICD-10-CM | POA: Diagnosis not present

## 2012-05-23 DIAGNOSIS — Z5189 Encounter for other specified aftercare: Secondary | ICD-10-CM | POA: Diagnosis not present

## 2012-05-23 DIAGNOSIS — I2510 Atherosclerotic heart disease of native coronary artery without angina pectoris: Secondary | ICD-10-CM | POA: Diagnosis not present

## 2012-05-23 DIAGNOSIS — E782 Mixed hyperlipidemia: Secondary | ICD-10-CM | POA: Diagnosis not present

## 2012-05-23 DIAGNOSIS — I10 Essential (primary) hypertension: Secondary | ICD-10-CM | POA: Diagnosis not present

## 2012-05-23 DIAGNOSIS — Z951 Presence of aortocoronary bypass graft: Secondary | ICD-10-CM | POA: Diagnosis not present

## 2012-05-24 DIAGNOSIS — I25810 Atherosclerosis of coronary artery bypass graft(s) without angina pectoris: Secondary | ICD-10-CM | POA: Diagnosis not present

## 2012-05-24 DIAGNOSIS — I2510 Atherosclerotic heart disease of native coronary artery without angina pectoris: Secondary | ICD-10-CM | POA: Diagnosis not present

## 2012-05-24 DIAGNOSIS — I10 Essential (primary) hypertension: Secondary | ICD-10-CM | POA: Diagnosis not present

## 2012-05-24 DIAGNOSIS — E782 Mixed hyperlipidemia: Secondary | ICD-10-CM | POA: Diagnosis not present

## 2012-05-26 DIAGNOSIS — Z5189 Encounter for other specified aftercare: Secondary | ICD-10-CM | POA: Diagnosis not present

## 2012-05-26 DIAGNOSIS — D649 Anemia, unspecified: Secondary | ICD-10-CM | POA: Diagnosis not present

## 2012-05-26 DIAGNOSIS — I2510 Atherosclerotic heart disease of native coronary artery without angina pectoris: Secondary | ICD-10-CM | POA: Diagnosis not present

## 2012-05-26 DIAGNOSIS — E782 Mixed hyperlipidemia: Secondary | ICD-10-CM | POA: Diagnosis not present

## 2012-05-26 DIAGNOSIS — Z951 Presence of aortocoronary bypass graft: Secondary | ICD-10-CM | POA: Diagnosis not present

## 2012-05-26 DIAGNOSIS — I10 Essential (primary) hypertension: Secondary | ICD-10-CM | POA: Diagnosis not present

## 2012-07-07 DIAGNOSIS — D509 Iron deficiency anemia, unspecified: Secondary | ICD-10-CM | POA: Diagnosis not present

## 2012-07-11 DIAGNOSIS — L600 Ingrowing nail: Secondary | ICD-10-CM | POA: Diagnosis not present

## 2012-08-04 DIAGNOSIS — D509 Iron deficiency anemia, unspecified: Secondary | ICD-10-CM | POA: Diagnosis not present

## 2012-08-09 DIAGNOSIS — D509 Iron deficiency anemia, unspecified: Secondary | ICD-10-CM | POA: Diagnosis not present

## 2012-08-31 DIAGNOSIS — Z23 Encounter for immunization: Secondary | ICD-10-CM | POA: Diagnosis not present

## 2012-09-19 ENCOUNTER — Encounter: Payer: Self-pay | Admitting: Neurosurgery

## 2012-09-20 ENCOUNTER — Encounter: Payer: Self-pay | Admitting: Neurosurgery

## 2012-09-20 ENCOUNTER — Ambulatory Visit (INDEPENDENT_AMBULATORY_CARE_PROVIDER_SITE_OTHER): Payer: Medicare Other | Admitting: Neurosurgery

## 2012-09-20 ENCOUNTER — Other Ambulatory Visit (INDEPENDENT_AMBULATORY_CARE_PROVIDER_SITE_OTHER): Payer: Medicare Other

## 2012-09-20 VITALS — BP 153/67 | HR 60 | Resp 16 | Ht 67.0 in | Wt 185.1 lb

## 2012-09-20 DIAGNOSIS — Z48812 Encounter for surgical aftercare following surgery on the circulatory system: Secondary | ICD-10-CM | POA: Diagnosis not present

## 2012-09-20 DIAGNOSIS — I6529 Occlusion and stenosis of unspecified carotid artery: Secondary | ICD-10-CM

## 2012-09-20 NOTE — Progress Notes (Signed)
[facility] Carotid Office Note  CC: Carotid surveillance Referring Physician: Early  History of Present Illness: 71 year old male patient of Dr. Arbie Cookey status post left CEA in 2001 with Dacron patch and angioplasty. The patient denies any signs or symptoms of CVA, TIA, amaurosis fugax or any neural deficit. The patient denies any new medical diagnoses or recent surgery.  Past Medical History  Diagnosis Date  . Hypertension   . Hyperlipidemia   . Carotid artery occlusion   . CAD (coronary artery disease)   . COPD (chronic obstructive pulmonary disease)     ROS: [x]  Positive   [ ]  Denies    General: [ ]  Weight loss, [ ]  Fever, [ ]  chills Neurologic: [ ]  Dizziness, [ ]  Blackouts, [ ]  Seizure [ ]  Stroke, [ ]  "Mini stroke", [ ]  Slurred speech, [ ]  Temporary blindness; [ ]  weakness in arms or legs, [ ]  Hoarseness Cardiac: [ ]  Chest pain/pressure, [ ]  Shortness of breath at rest [ ]  Shortness of breath with exertion, [ ]  Atrial fibrillation or irregular heartbeat Vascular: [ ]  Pain in legs with walking, [ ]  Pain in legs at rest, [ ]  Pain in legs at night,  [ ]  Non-healing ulcer, [ ]  Blood clot in vein/DVT,   Pulmonary: [ ]  Home oxygen, [ ]  Productive cough, [ ]  Coughing up blood, [ ]  Asthma,  [ ]  Wheezing Musculoskeletal:  [ ]  Arthritis, [ ]  Low back pain, [ ]  Joint pain Hematologic: [ ]  Easy Bruising, [ ]  Anemia; [ ]  Hepatitis Gastrointestinal: [ ]  Blood in stool, [ ]  Gastroesophageal Reflux/heartburn, [ ]  Trouble swallowing Urinary: [ ]  chronic Kidney disease, [ ]  on HD - [ ]  MWF or [ ]  TTHS, [ ]  Burning with urination, [ ]  Difficulty urinating Skin: [ ]  Rashes, [ ]  Wounds Psychological: [ ]  Anxiety, [ ]  Depression   Social History History  Substance Use Topics  . Smoking status: Former Smoker -- 1.0 packs/day    Types: Cigarettes    Quit date: 08/07/2010  . Smokeless tobacco: Not on file  . Alcohol Use: Yes     1 glass of wine weekly    Family  History Family History  Problem Relation Age of Onset  . Hypertension Mother   . Diabetes Mother     No Known Allergies  Current Outpatient Prescriptions  Medication Sig Dispense Refill  . aspirin EC 81 MG tablet Take 81 mg by mouth daily.        Marland Kitchen atorvastatin (LIPITOR) 20 MG tablet Take 20 mg by mouth daily.        . hydrochlorothiazide 25 MG tablet Take 25 mg by mouth daily.        Marland Kitchen losartan (COZAAR) 50 MG tablet Take 100 mg by mouth daily.       . metoprolol (LOPRESSOR) 50 MG tablet Take 50 mg by mouth 2 (two) times daily.        Marland Kitchen omeprazole (PRILOSEC) 40 MG capsule Take 40 mg by mouth daily.        . potassium chloride (KLOR-CON) 10 MEQ CR tablet Take 10 mEq by mouth daily.        . Fluticasone-Salmeterol (ADVAIR) 250-50 MCG/DOSE AEPB Inhale 1 puff into the lungs 2 (two) times daily.        . Hydroxychloroquine Sulfate (PLAQUENIL PO) Take 100 mg by mouth daily.        . nicotine (NICODERM CQ - DOSED IN MG/24 HOURS) 21 mg/24hr  patch Place 1 patch onto the skin daily.          Physical Examination  Filed Vitals:   09/20/12 1411  BP: 153/67  Pulse: 60  Resp:     Body mass index is 28.99 kg/(m^2).  General:  WDWN in NAD Gait: Normal HEENT: WNL Eyes: Pupils equal Pulmonary: normal non-labored breathing , without Rales, rhonchi,  wheezing Cardiac: RRR, without  Murmurs, rubs or gallops; Abdomen: soft, NT, no masses Skin: no rashes, ulcers noted  Vascular Exam Pulses: 3+ radial pulses bilaterally Carotid bruits: Carotid pulses to auscultation no bruits are heard Extremities without ischemic changes, no Gangrene , no cellulitis; no open wounds;  Musculoskeletal: no muscle wasting or atrophy   Neurologic: A&O X 3; Appropriate Affect ; SENSATION: normal; MOTOR FUNCTION:  moving all extremities equally. Speech is fluent/normal  Non-Invasive Vascular Imaging CAROTID DUPLEX 09/20/2012  Right ICA 40 - 59 % stenosis Left ICA 20 - 39 % stenosis   ASSESSMENT/PLAN:  Asymptomatic patient doing well, the patient will followup in one year with repeat carotid duplex. The patient's questions were encouraged and answered, he is in agreement with this plan.  Lauree Chandler ANP   Clinic MD: Hart Rochester on call

## 2012-09-21 NOTE — Addendum Note (Signed)
Addended by: Sharee Pimple on: 09/21/2012 12:39 PM   Modules accepted: Orders

## 2012-09-25 DIAGNOSIS — H524 Presbyopia: Secondary | ICD-10-CM | POA: Diagnosis not present

## 2012-09-25 DIAGNOSIS — H2589 Other age-related cataract: Secondary | ICD-10-CM | POA: Diagnosis not present

## 2012-12-11 DIAGNOSIS — E785 Hyperlipidemia, unspecified: Secondary | ICD-10-CM | POA: Diagnosis not present

## 2012-12-11 DIAGNOSIS — E119 Type 2 diabetes mellitus without complications: Secondary | ICD-10-CM | POA: Diagnosis not present

## 2012-12-11 DIAGNOSIS — Z79899 Other long term (current) drug therapy: Secondary | ICD-10-CM | POA: Diagnosis not present

## 2012-12-11 DIAGNOSIS — I2510 Atherosclerotic heart disease of native coronary artery without angina pectoris: Secondary | ICD-10-CM | POA: Diagnosis not present

## 2012-12-12 DIAGNOSIS — E782 Mixed hyperlipidemia: Secondary | ICD-10-CM | POA: Diagnosis not present

## 2012-12-12 DIAGNOSIS — I2510 Atherosclerotic heart disease of native coronary artery without angina pectoris: Secondary | ICD-10-CM | POA: Diagnosis not present

## 2012-12-12 DIAGNOSIS — I10 Essential (primary) hypertension: Secondary | ICD-10-CM | POA: Diagnosis not present

## 2012-12-12 DIAGNOSIS — I7389 Other specified peripheral vascular diseases: Secondary | ICD-10-CM | POA: Diagnosis not present

## 2012-12-15 ENCOUNTER — Other Ambulatory Visit (HOSPITAL_COMMUNITY): Payer: Self-pay | Admitting: Cardiovascular Disease

## 2012-12-15 DIAGNOSIS — R011 Cardiac murmur, unspecified: Secondary | ICD-10-CM

## 2012-12-15 DIAGNOSIS — I10 Essential (primary) hypertension: Secondary | ICD-10-CM

## 2012-12-18 ENCOUNTER — Ambulatory Visit (HOSPITAL_COMMUNITY)
Admission: RE | Admit: 2012-12-18 | Discharge: 2012-12-18 | Disposition: A | Discharge status: Home / Self Care | Payer: Medicare Other | Source: Ambulatory Visit | Attending: Cardiovascular Disease | Admitting: Cardiovascular Disease

## 2012-12-18 DIAGNOSIS — R011 Cardiac murmur, unspecified: Secondary | ICD-10-CM | POA: Diagnosis not present

## 2012-12-18 DIAGNOSIS — I369 Nonrheumatic tricuspid valve disorder, unspecified: Secondary | ICD-10-CM | POA: Insufficient documentation

## 2012-12-18 DIAGNOSIS — I10 Essential (primary) hypertension: Secondary | ICD-10-CM

## 2012-12-18 DIAGNOSIS — I2510 Atherosclerotic heart disease of native coronary artery without angina pectoris: Secondary | ICD-10-CM | POA: Diagnosis not present

## 2012-12-18 DIAGNOSIS — I059 Rheumatic mitral valve disease, unspecified: Secondary | ICD-10-CM | POA: Diagnosis not present

## 2012-12-18 NOTE — Progress Notes (Signed)
2D Echo Performed 12/18/2012    Clinten Howk, RCS  

## 2012-12-19 ENCOUNTER — Ambulatory Visit (HOSPITAL_COMMUNITY): Payer: Medicare Other

## 2013-01-04 ENCOUNTER — Encounter: Payer: Self-pay | Admitting: *Deleted

## 2013-01-04 DIAGNOSIS — J441 Chronic obstructive pulmonary disease with (acute) exacerbation: Secondary | ICD-10-CM | POA: Insufficient documentation

## 2013-01-04 DIAGNOSIS — E785 Hyperlipidemia, unspecified: Secondary | ICD-10-CM

## 2013-01-04 DIAGNOSIS — I10 Essential (primary) hypertension: Secondary | ICD-10-CM | POA: Insufficient documentation

## 2013-01-04 DIAGNOSIS — F17200 Nicotine dependence, unspecified, uncomplicated: Secondary | ICD-10-CM | POA: Insufficient documentation

## 2013-01-04 DIAGNOSIS — I2510 Atherosclerotic heart disease of native coronary artery without angina pectoris: Secondary | ICD-10-CM | POA: Insufficient documentation

## 2013-01-04 DIAGNOSIS — Z951 Presence of aortocoronary bypass graft: Secondary | ICD-10-CM

## 2013-01-04 DIAGNOSIS — E1169 Type 2 diabetes mellitus with other specified complication: Secondary | ICD-10-CM | POA: Insufficient documentation

## 2013-01-06 ENCOUNTER — Other Ambulatory Visit: Payer: Self-pay

## 2013-03-13 ENCOUNTER — Encounter: Payer: Self-pay | Admitting: Cardiovascular Disease

## 2013-05-01 DIAGNOSIS — L930 Discoid lupus erythematosus: Secondary | ICD-10-CM | POA: Diagnosis not present

## 2013-05-01 DIAGNOSIS — L981 Factitial dermatitis: Secondary | ICD-10-CM | POA: Diagnosis not present

## 2013-05-28 ENCOUNTER — Other Ambulatory Visit: Payer: Self-pay

## 2013-05-28 MED ORDER — POTASSIUM CHLORIDE ER 10 MEQ PO TBCR: 10.0000 meq | EXTENDED_RELEASE_TABLET | Freq: Every day | ORAL | Status: DC

## 2013-05-28 MED ORDER — ATORVASTATIN CALCIUM 20 MG PO TABS: 20.0000 mg | ORAL_TABLET | Freq: Every day | ORAL | Status: DC

## 2013-05-28 NOTE — Telephone Encounter (Signed)
Rx was sent to pharmacy electronically via [employer].  

## 2013-06-27 ENCOUNTER — Other Ambulatory Visit: Payer: Self-pay

## 2013-06-27 MED ORDER — HYDROCHLOROTHIAZIDE 25 MG PO TABS: 25.0000 mg | ORAL_TABLET | Freq: Every day | ORAL | Status: DC

## 2013-06-27 NOTE — Telephone Encounter (Signed)
Rx was sent to pharmacy electronically via [employer].  

## 2013-07-26 ENCOUNTER — Other Ambulatory Visit: Payer: Self-pay | Admitting: *Deleted

## 2013-07-26 MED ORDER — METOPROLOL TARTRATE 50 MG PO TABS: 50.0000 mg | ORAL_TABLET | Freq: Two times a day (BID) | ORAL | Status: DC

## 2013-07-26 NOTE — Telephone Encounter (Signed)
Rx was sent to pharmacy electronically via [employer] 

## 2013-08-22 DIAGNOSIS — Z23 Encounter for immunization: Secondary | ICD-10-CM | POA: Diagnosis not present

## 2013-09-07 ENCOUNTER — Other Ambulatory Visit: Payer: Self-pay | Admitting: Vascular Surgery

## 2013-09-07 DIAGNOSIS — Z48812 Encounter for surgical aftercare following surgery on the circulatory system: Secondary | ICD-10-CM

## 2013-09-07 DIAGNOSIS — I6529 Occlusion and stenosis of unspecified carotid artery: Secondary | ICD-10-CM

## 2013-09-18 ENCOUNTER — Other Ambulatory Visit: Payer: Self-pay | Admitting: *Deleted

## 2013-09-18 MED ORDER — HYDROCHLOROTHIAZIDE 25 MG PO TABS: 25.0000 mg | ORAL_TABLET | Freq: Every day | ORAL | Status: DC

## 2013-09-18 NOTE — Telephone Encounter (Signed)
Called pharmacy to clarify refills on file.. While on phone gave authorization for #30 with 3 refills for hydrochlorothiazide 25mg  QD

## 2013-09-24 ENCOUNTER — Encounter: Payer: Self-pay | Admitting: Family

## 2013-09-25 ENCOUNTER — Ambulatory Visit (HOSPITAL_COMMUNITY)
Admission: RE | Admit: 2013-09-25 | Discharge: 2013-09-25 | Disposition: A | Discharge status: Home / Self Care | Payer: Medicare Other | Source: Ambulatory Visit | Attending: Family | Admitting: Family

## 2013-09-25 ENCOUNTER — Ambulatory Visit (INDEPENDENT_AMBULATORY_CARE_PROVIDER_SITE_OTHER): Payer: Medicare Other | Admitting: Family

## 2013-09-25 ENCOUNTER — Encounter: Payer: Self-pay | Admitting: Family

## 2013-09-25 DIAGNOSIS — Z48812 Encounter for surgical aftercare following surgery on the circulatory system: Secondary | ICD-10-CM

## 2013-09-25 DIAGNOSIS — I6529 Occlusion and stenosis of unspecified carotid artery: Secondary | ICD-10-CM | POA: Diagnosis not present

## 2013-09-25 NOTE — Progress Notes (Signed)
Established Carotid Patient  History of Present Illness  Thomas Gray is a 72 y.o. male patient of Dr. Arbie Cookey status post left CEA in 2011, had 4 vessel CABG the same day, denies having an MI.   Patient has Negative history of TIA or stroke symptom.  The patient denies amaurosis fugax or monocular blindness.  The patient  denies facial drooping.  Pt. denies hemiplegia.  The patient denies receptive or expressive aphasia.  Pt. denies extremity weakness. The patient also denies claudication symptoms.   Patient denies New Medical or Surgical History.  Pt Diabetic: No Pt smoker: former smoker, quit 2011  Pt meds include: Statin : Yes Betablocker: Yes ASA: Yes Other anticoagulants/antiplatelets: no   Past Medical History  Diagnosis Date  . Hypertension   . Hyperlipidemia   . 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 CAD (coronary artery disease)     12/29/2011 normal R/S nuclear study;08/05/2010 cath smooth 50% ostial left main 60% distal left main, 60% ca+ smooth prox LAD, 70% ramus intermed., 90% prox. near ostial, 80% prox. CX, diffuse 30%prox. RCA, 95% eccentric stenosis mid RCA 5o0-90%distal RCA; 50% distal aortic stenosis  . COPD (chronic obstructive pulmonary disease)   . 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 Early and Hendrickson  . Heart murmur     Echo:  Mitral regurg. EF 55-65%  . H/O discoid lupus erythematosus   . H/O discoid lupus erythematosus     Social History History  Substance Use Topics  . Smoking status: Former Smoker -- 1.00 packs/day    Types: Cigarettes    Quit date: 08/07/2010  . Smokeless tobacco: Not on file  . Alcohol Use: Yes     Comment: 1 glass of wine weekly    Family History Family History  Problem Relation Age of Onset  . Hypertension Mother   . Diabetes Mother     Surgical History Past Surgical History  Procedure Laterality Date  . Tonsillectomy    . Cholecystectomy  1991    gall  bladder  . Carotid endarterectomy  08/07/2010  . Coronary artery bypass graft  08/07/2010    No Known Allergies  Current Outpatient Prescriptions  Medication Sig Dispense Refill  . aspirin EC 81 MG tablet Take 81 mg by mouth Gray.        Marland Kitchen atorvastatin (LIPITOR) 20 MG tablet Take 1 tablet (20 mg total) by mouth Gray.  90 tablet  2  . hydrochlorothiazide (HYDRODIURIL) 25 MG tablet Take 1 tablet (25 mg total) by mouth Gray.  30 tablet  3  . losartan (COZAAR) 50 MG tablet Take 100 mg by mouth Gray.       . metoprolol (LOPRESSOR) 50 MG tablet Take 1 tablet (50 mg total) by mouth 2 (two) times Gray.  180 tablet  2  . omeprazole (PRILOSEC) 40 MG capsule Take 40 mg by mouth Gray.        . potassium chloride (K-DUR) 10 MEQ tablet Take 1 tablet (10 mEq total) by mouth Gray.  90 tablet  2  . Fluticasone-Salmeterol (ADVAIR) 250-50 MCG/DOSE AEPB Inhale 1 puff into the lungs 2 (two) times Gray.        . Hydroxychloroquine Sulfate (PLAQUENIL PO) Take 100 mg by mouth Gray.        . nicotine (NICODERM CQ - DOSED IN MG/24 HOURS) 21 mg/24hr patch Place 1 patch onto the skin Gray.  No current facility-administered medications for this visit.    Review of Systems : [x]  Positive   [ ]  Denies  General:[ ]  Weight loss,  [ ]  Weight gain, [ ]  Loss of appetite, [ ]  Fever, [ ]  chills  Neurologic: [ ]  Dizziness, [ ]  Blackouts, [ ]  Headaches, [ ]  Seizure [ ]  Stroke, [ ]  "Mini stroke", [ ]  Slurred speech, [ ]  Temporary blindness;  [ ] weakness,  Ear/Nose/Throat: [ ]  Change in hearing, [ ]  Nose bleeds, [ ]  Hoarseness  Vascular:[ ]  Pain in legs with walking, [ ]  Pain in feet while lying flat , [ ]   Non-healing ulcer, [ ]  Blood clot in vein,    Pulmonary: [ ]  Home oxygen, [ ]   Productive cough, [ ]  Bronchitis, [ ]  Coughing up blood,  [ ]  Asthma, [ ]  Wheezing  Musculoskeletal:  [ ]  Arthritis, [ ]  Joint pain, [ ]  low back pain  Cardiac: [ ]  Chest pain, [ ]  Shortness of breath when lying flat, [ ]   Shortness of breath with exertion, [ ]  Palpitations, [ ]  Heart murmur, [ ]   Atrial fibrillation  Hematologic:[ ]  Easy Bruising, [ ]  Anemia; [ ]  Hepatitis  Psychiatric: [ ]   Depression, [ ]  Anxiety   Gastrointestinal: [ ]  Black stool, [ ]  Blood in stool, [ ]  Peptic ulcer disease,  [ ]  Gastroesophageal Reflux, [ ]  Trouble swallowing, [ ]  Diarrhea, [ ]  Constipation  Urinary: [ ]  chronic Kidney disease, [ ]  on HD, [ ]  Burning with urination, [ ]  Frequent urination, [ ]  Difficulty urinating;   Skin: [ ]  Rashes, [ ]  Wounds    Physical Examination  Filed Vitals:   09/25/13 1535  BP: 139/64  Pulse: 60  Resp:    Filed Weights   09/25/13 1532  Weight: 188 lb (85.276 kg)   Body mass index is 29.44 kg/(m^2).  General: WDWN male in NAD GAIT: normal Eyes: PERRLA Pulmonary:  CTAB, Negative  Rales, Negative rhonchi, & Negative wheezing.  Cardiac: regular Rhythm ,  Negative Murmurs.  VASCULAR EXAM Carotid Bruits Left Right   Negative Negative    Aorta is not palpable. Radial pulses are 2+ palpable and equal.                                                                                                                            LE Pulses LEFT RIGHT       POPLITEAL  not palpable   not palpable       POSTERIOR TIBIAL   palpable    palpable        DORSALIS PEDIS      ANTERIOR TIBIAL not palpable  not palpable     Gastrointestinal: soft, nontender, BS WNL, no r/g,  negative masses.  Musculoskeletal: Negative muscle atrophy/wasting. M/S 5/5 throughout, Extremities without ischemic changes.  Neurologic: A&O X 3; Appropriate Affect ; SENSATION ;normal;  Speech is normal CN 2-12 intact, Pain and light touch intact  in extremities, Motor exam as listed above.   Non-Invasive Vascular Imaging CAROTID DUPLEX 09/25/2013   Right ICA: 40 - 59 % proximal ICA stenosis. Left ICA: patent CEA site.  These findings are Unchanged from previous exam.  Assessment: Thomas Gray is a 72 y.o.  male who presents with asymptomatic 40 - 59 % Right proximal ICA stenosis and patent left ICA which is the CEA site. The  ICA stenosis is  Unchanged from previous exam. He exercises regularly. He is doing well, needs yearly surveillance of carotid arteries per post CEA protocol.  Plan: Follow-up in 1 year with Carotid Duplex scan.   I discussed in depth with the patient the nature of atherosclerosis, and emphasized the importance of maximal medical management including strict control of blood pressure, blood glucose, and lipid levels, obtaining regular exercise, and continued cessation of smoking.  The patient is aware that without maximal medical management the underlying atherosclerotic disease process will progress, limiting the benefit of any interventions. The patient was given information about stroke prevention and what symptoms should prompt the patient to seek immediate medical care. Thank you for allowing [place] to participate in this patient's care.  Charisse March, RN, MSN, FNP-C [facility] Office: [phone]  Clinic Physician: Early  09/25/2013 3:30 PM

## 2013-09-26 DIAGNOSIS — H25019 Cortical age-related cataract, unspecified eye: Secondary | ICD-10-CM | POA: Diagnosis not present

## 2013-09-26 NOTE — Addendum Note (Signed)
Addended by: Sharee Pimple on: 09/26/2013 08:01 AM   Modules accepted: Orders

## 2013-09-27 ENCOUNTER — Other Ambulatory Visit: Payer: Self-pay

## 2013-10-02 DIAGNOSIS — J159 Unspecified bacterial pneumonia: Secondary | ICD-10-CM | POA: Diagnosis not present

## 2013-10-02 DIAGNOSIS — R0602 Shortness of breath: Secondary | ICD-10-CM | POA: Diagnosis not present

## 2013-10-02 DIAGNOSIS — R509 Fever, unspecified: Secondary | ICD-10-CM | POA: Diagnosis not present

## 2013-10-02 DIAGNOSIS — R6889 Other general symptoms and signs: Secondary | ICD-10-CM | POA: Diagnosis not present

## 2013-10-02 DIAGNOSIS — E7800 Pure hypercholesterolemia, unspecified: Secondary | ICD-10-CM | POA: Diagnosis not present

## 2013-10-02 DIAGNOSIS — Z79899 Other long term (current) drug therapy: Secondary | ICD-10-CM | POA: Diagnosis not present

## 2013-10-02 DIAGNOSIS — K219 Gastro-esophageal reflux disease without esophagitis: Secondary | ICD-10-CM | POA: Diagnosis not present

## 2013-10-02 DIAGNOSIS — I10 Essential (primary) hypertension: Secondary | ICD-10-CM | POA: Diagnosis not present

## 2013-10-02 DIAGNOSIS — R9431 Abnormal electrocardiogram [ECG] [EKG]: Secondary | ICD-10-CM | POA: Diagnosis not present

## 2013-10-23 ENCOUNTER — Other Ambulatory Visit: Payer: Self-pay | Admitting: *Deleted

## 2013-10-23 MED ORDER — HYDROCHLOROTHIAZIDE 25 MG PO TABS: 25.0000 mg | ORAL_TABLET | Freq: Every day | ORAL | Status: DC

## 2014-01-09 ENCOUNTER — Ambulatory Visit (INDEPENDENT_AMBULATORY_CARE_PROVIDER_SITE_OTHER): Payer: Medicare Other | Admitting: Cardiovascular Disease

## 2014-01-09 ENCOUNTER — Encounter: Payer: Self-pay | Admitting: Cardiovascular Disease

## 2014-01-09 VITALS — BP 186/88 | HR 76 | Ht 66.0 in | Wt 185.0 lb

## 2014-01-09 DIAGNOSIS — R3911 Hesitancy of micturition: Secondary | ICD-10-CM | POA: Diagnosis not present

## 2014-01-09 DIAGNOSIS — Z79899 Other long term (current) drug therapy: Secondary | ICD-10-CM | POA: Diagnosis not present

## 2014-01-09 DIAGNOSIS — I6529 Occlusion and stenosis of unspecified carotid artery: Secondary | ICD-10-CM

## 2014-01-09 DIAGNOSIS — I2510 Atherosclerotic heart disease of native coronary artery without angina pectoris: Secondary | ICD-10-CM | POA: Diagnosis not present

## 2014-01-09 DIAGNOSIS — R5381 Other malaise: Secondary | ICD-10-CM | POA: Diagnosis not present

## 2014-01-09 DIAGNOSIS — R5383 Other fatigue: Secondary | ICD-10-CM | POA: Diagnosis not present

## 2014-01-09 DIAGNOSIS — Z951 Presence of aortocoronary bypass graft: Secondary | ICD-10-CM

## 2014-01-09 DIAGNOSIS — E785 Hyperlipidemia, unspecified: Secondary | ICD-10-CM

## 2014-01-09 DIAGNOSIS — I10 Essential (primary) hypertension: Secondary | ICD-10-CM

## 2014-01-09 MED ORDER — SILDENAFIL CITRATE 50 MG PO TABS: 50.0000 mg | ORAL_TABLET | Freq: Every day | ORAL | Status: DC | PRN

## 2014-01-09 NOTE — Assessment & Plan Note (Signed)
Status post coronary artery bypass grafting by Dr. Erasmo Leventhal in 2011 with a LIMA to the LAD, vein graft to an obtuse diagonal branch, sequential to the PDA and PLA. His last Myoview stress test performed 12/29/11 was nonischemic. He has normal LV function by 2-D echocardiography. He denies chest pain or shortness of breath.

## 2014-01-09 NOTE — Progress Notes (Signed)
01/09/2014 [name]   28-Apr-1941  [id number]  Primary Physician Rebecca Eaton, MD Primary Cardiologist: [name] MD Renae Gloss   HPI:  Thomas Gray is a 73 year old moderately overweight the first Caucasian male father of 3 sons, grandfather to 5 granddaughters who was formerly a patient of Dr. Terance Ice. I am assuming his cardiovascular care. He does not have a primary care physician. His cardiac risk factor profile is significant for 50 pack years of tobacco abuse having quit 4 years ago at the time of his coronary artery bypass grafting. History of hypertension and hyperlipidemia. There is no family history. He has never had a heart attack or stroke. He denies chest pain or shortness of breath. He had coronary artery bypass grafting x4 by Dr. Merilynn Finland in 2011 concomitant left carotid endarterectomy performed by Dr. Curt Jews. A Myoview stress test performed in 2002 was nonischemic. He denies chest pain or shortness of breath.   Current Outpatient Prescriptions  Medication Sig Dispense Refill  . aspirin EC 81 MG tablet Take 81 mg by mouth daily.        Marland Kitchen atorvastatin (LIPITOR) 20 MG tablet Take 1 tablet (20 mg total) by mouth daily.  90 tablet  2  . hydrochlorothiazide (HYDRODIURIL) 25 MG tablet Take 1 tablet (25 mg total) by mouth daily.  30 tablet  3  . losartan (COZAAR) 50 MG tablet Take 100 mg by mouth daily.       . metoprolol (LOPRESSOR) 50 MG tablet Take 1 tablet (50 mg total) by mouth 2 (two) times daily.  180 tablet  2  . omeprazole (PRILOSEC) 40 MG capsule Take 40 mg by mouth daily.        . potassium chloride (K-DUR) 10 MEQ tablet Take 1 tablet (10 mEq total) by mouth daily.  90 tablet  2  . sildenafil (VIAGRA) 50 MG tablet Take 1 tablet (50 mg total) by mouth daily as needed for erectile dysfunction.  10 tablet  3   No current facility-administered medications for this visit.    No Known Allergies  History   Social History    . Marital Status: Single    Spouse Name: N/A    Number of Children: N/A  . Years of Education: N/A   Occupational History  . Not on file.   Social History Main Topics  . Smoking status: Former Smoker -- 1.00 packs/day    Types: Cigarettes    Quit date: 08/07/2010  . Smokeless tobacco: Never Used  . Alcohol Use: Yes     Comment: 1 glass of wine weekly  . Drug Use: No  . Sexual Activity: Not on file   Other Topics Concern  . Not on file   Social History Narrative  . No narrative on file     Review of Systems: General: negative for chills, fever, night sweats or weight changes.  Cardiovascular: negative for chest pain, dyspnea on exertion, edema, orthopnea, palpitations, paroxysmal nocturnal dyspnea or shortness of breath Dermatological: negative for rash Respiratory: negative for cough or wheezing Urologic: negative for hematuria Abdominal: negative for nausea, vomiting, diarrhea, bright red blood per rectum, melena, or hematemesis Neurologic: negative for visual changes, syncope, or dizziness All other systems reviewed and are otherwise negative except as noted above.    Blood pressure 186/88, pulse 76, height 5\' 6"  (1.676 m), weight 83.915 kg (185 lb).  General appearance: alert and no distress Neck: no adenopathy, no carotid bruit, no JVD,  supple, symmetrical, trachea midline and thyroid not enlarged, symmetric, no tenderness/mass/nodules Lungs: clear to auscultation bilaterally Heart: regular rate and rhythm, S1, S2 normal, no murmur, click, rub or gallop Abdomen: soft, non-tender; bowel sounds normal; no masses,  no organomegaly Extremities: extremities normal, atraumatic, no cyanosis or edema Pulses: Right Posterior Tibial present 2+ diminished pedal pulses bilaterally  EKG normal sinus rhythm at 76 with nonspecific ST and T-wave changes  ASSESSMENT AND PLAN:   S/P CABG x 4 Status post coronary artery bypass grafting by Dr. Erasmo Leventhal in 2011 with a  LIMA to the LAD, vein graft to an obtuse diagonal branch, sequential to the PDA and PLA. His last Myoview stress test performed 12/29/11 was nonischemic. He has normal LV function by 2-D echocardiography. He denies chest pain or shortness of breath.  Occlusion and stenosis of carotid artery without mention of cerebral infarction Status post elective left carotid endarterectomy performed by Dr. Sherren Mocha early at the same time he had coronary bypass grafting. Dr. [name] causes carotid duplex on a regular basis.  Essential hypertension, benign Well controlled at home on his current medications. 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 He complains of erectile dysfunction on beta blocker. I provided him with a prescription for Viagra.  Other and unspecified hyperlipidemia On statin therapy. We'll recheck a lipid and liver profile      [name] MD [facility], [facility] 01/09/2014 4:00 PM

## 2014-01-09 NOTE — Assessment & Plan Note (Signed)
On statin therapy. We'll recheck a lipid and liver profile 

## 2014-01-09 NOTE — Assessment & Plan Note (Signed)
Well controlled at home on his current medications. 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 He complains of erectile dysfunction on beta blocker. I provided him with a prescription for Viagra.

## 2014-01-09 NOTE — Assessment & Plan Note (Signed)
Status post elective left carotid endarterectomy performed by Dr. Sherren Mocha early at the same time he had coronary bypass grafting. Dr. [name] causes carotid duplex on a regular basis.

## 2014-01-09 NOTE — Patient Instructions (Signed)
  We will see you back in follow up in 6 months with an extender and 1 year with Dr Gwenlyn Found.  Dr Gwenlyn Found has ordered blood work to be done at your convenience, fasting.   We have sent in a prescription for Viagra.

## 2014-01-29 ENCOUNTER — Other Ambulatory Visit: Payer: Self-pay

## 2014-01-29 MED ORDER — HYDROCHLOROTHIAZIDE 25 MG PO TABS: 25.0000 mg | ORAL_TABLET | Freq: Every day | ORAL | Status: DC

## 2014-01-29 NOTE — Telephone Encounter (Signed)
Rx was sent to pharmacy electronically. 

## 2014-01-31 ENCOUNTER — Other Ambulatory Visit: Payer: Self-pay | Admitting: *Deleted

## 2014-01-31 ENCOUNTER — Telehealth: Payer: Self-pay | Admitting: Cardiovascular Disease

## 2014-01-31 MED ORDER — HYDROCHLOROTHIAZIDE 25 MG PO TABS: 25.0000 mg | ORAL_TABLET | Freq: Every day | ORAL | Status: DC

## 2014-01-31 NOTE — Telephone Encounter (Signed)
Need a new prescription for his Hydrochlorothiozide 25 mg #90 and refills. Send to [id number]

## 2014-01-31 NOTE — Telephone Encounter (Signed)
Refills sent to pharmacy. 

## 2014-02-04 DIAGNOSIS — E785 Hyperlipidemia, unspecified: Secondary | ICD-10-CM | POA: Diagnosis not present

## 2014-02-04 DIAGNOSIS — R5381 Other malaise: Secondary | ICD-10-CM | POA: Diagnosis not present

## 2014-02-04 DIAGNOSIS — Z79899 Other long term (current) drug therapy: Secondary | ICD-10-CM | POA: Diagnosis not present

## 2014-02-04 DIAGNOSIS — R3911 Hesitancy of micturition: Secondary | ICD-10-CM | POA: Diagnosis not present

## 2014-02-13 ENCOUNTER — Other Ambulatory Visit: Payer: Self-pay | Admitting: *Deleted

## 2014-02-13 MED ORDER — LOSARTAN POTASSIUM 100 MG PO TABS: 100.0000 mg | ORAL_TABLET | Freq: Every day | ORAL | Status: DC

## 2014-03-21 ENCOUNTER — Other Ambulatory Visit: Payer: Self-pay

## 2014-03-21 MED ORDER — ATORVASTATIN CALCIUM 20 MG PO TABS: 20.0000 mg | ORAL_TABLET | Freq: Every day | ORAL | Status: DC

## 2014-03-21 NOTE — Telephone Encounter (Signed)
Rx was sent to pharmacy electronically. 

## 2014-05-01 ENCOUNTER — Other Ambulatory Visit: Payer: Self-pay | Admitting: *Deleted

## 2014-05-01 MED ORDER — OMEPRAZOLE 40 MG PO CPDR: 40.0000 mg | DELAYED_RELEASE_CAPSULE | Freq: Every day | ORAL | Status: DC

## 2014-05-01 MED ORDER — METOPROLOL TARTRATE 50 MG PO TABS: 50.0000 mg | ORAL_TABLET | Freq: Two times a day (BID) | ORAL | Status: DC

## 2014-05-01 NOTE — Telephone Encounter (Signed)
Rx was sent to pharmacy electronically. 

## 2014-08-29 DIAGNOSIS — Z23 Encounter for immunization: Secondary | ICD-10-CM | POA: Diagnosis not present

## 2014-09-27 ENCOUNTER — Encounter: Payer: Self-pay | Admitting: Family

## 2014-09-27 DIAGNOSIS — H25813 Combined forms of age-related cataract, bilateral: Secondary | ICD-10-CM | POA: Diagnosis not present

## 2014-09-30 ENCOUNTER — Ambulatory Visit (HOSPITAL_COMMUNITY)
Admission: RE | Admit: 2014-09-30 | Discharge: 2014-09-30 | Disposition: A | Discharge status: Home / Self Care | Payer: Medicare Other | Source: Ambulatory Visit | Attending: Family | Admitting: Family

## 2014-09-30 ENCOUNTER — Ambulatory Visit (INDEPENDENT_AMBULATORY_CARE_PROVIDER_SITE_OTHER): Payer: Medicare Other | Admitting: Family

## 2014-09-30 ENCOUNTER — Encounter: Payer: Self-pay | Admitting: Family

## 2014-09-30 VITALS — BP 169/69 | HR 69 | Resp 16 | Ht 66.0 in | Wt 189.0 lb

## 2014-09-30 DIAGNOSIS — Z48812 Encounter for surgical aftercare following surgery on the circulatory system: Secondary | ICD-10-CM | POA: Insufficient documentation

## 2014-09-30 DIAGNOSIS — I6529 Occlusion and stenosis of unspecified carotid artery: Secondary | ICD-10-CM

## 2014-09-30 DIAGNOSIS — Z9889 Other specified postprocedural states: Secondary | ICD-10-CM | POA: Diagnosis not present

## 2014-09-30 DIAGNOSIS — I6523 Occlusion and stenosis of bilateral carotid arteries: Secondary | ICD-10-CM | POA: Insufficient documentation

## 2014-09-30 NOTE — Patient Instructions (Addendum)
Stroke Prevention Some medical conditions and behaviors are associated with an increased chance of having a stroke. You may prevent a stroke by making healthy choices and managing medical conditions. HOW CAN I REDUCE MY RISK OF HAVING A STROKE?   Stay physically active. Get at least 30 minutes of activity on most or all days.  Do not smoke. It may also be helpful to avoid exposure to secondhand smoke.  Limit alcohol use. Moderate alcohol use is considered to be:  No more than 2 drinks per day for men.  No more than 1 drink per day for nonpregnant women.  Eat healthy foods. This involves:  Eating 5 or more servings of fruits and vegetables a day.  Making dietary changes that address high blood pressure (hypertension), high cholesterol, diabetes, or obesity.  Manage your cholesterol levels.  Making food choices that are high in fiber and low in saturated fat, trans fat, and cholesterol may control cholesterol levels.  Take any prescribed medicines to control cholesterol as directed by your health care provider.  Manage your diabetes.  Controlling your carbohydrate and sugar intake is recommended to manage diabetes.  Take any prescribed medicines to control diabetes as directed by your health care provider.  Control your hypertension.  Making food choices that are low in salt (sodium), saturated fat, trans fat, and cholesterol is recommended to manage hypertension.  Take any prescribed medicines to control hypertension as directed by your health care provider.  Maintain a healthy weight.  Reducing calorie intake and making food choices that are low in sodium, saturated fat, trans fat, and cholesterol are recommended to manage weight.  Stop drug abuse.  Avoid taking birth control pills.  Talk to your health care provider about the risks of taking birth control pills if you are over 35 years old, smoke, get migraines, or have ever had a blood clot.  Get evaluated for sleep  disorders (sleep apnea).  Talk to your health care provider about getting a sleep evaluation if you snore a lot or have excessive sleepiness.  Take medicines only as directed by your health care provider.  For some people, aspirin or blood thinners (anticoagulants) are helpful in reducing the risk of forming abnormal blood clots that can lead to stroke. If you have the irregular heart rhythm of atrial fibrillation, you should be on a blood thinner unless there is a good reason you cannot take them.  Understand all your medicine instructions.  Make sure that other conditions (such as anemia or atherosclerosis) are addressed. SEEK IMMEDIATE MEDICAL CARE IF:   You have sudden weakness or numbness of the face, arm, or leg, especially on one side of the body.  Your face or eyelid droops to one side.  You have sudden confusion.  You have trouble speaking (aphasia) or understanding.  You have sudden trouble seeing in one or both eyes.  You have sudden trouble walking.  You have dizziness.  You have a loss of balance or coordination.  You have a sudden, severe headache with no known cause.  You have new chest pain or an irregular heartbeat. Any of these symptoms may represent a serious problem that is an emergency. Do not wait to see if the symptoms will go away. Get medical help at once. Call your local emergency services (911 in [place] Do not drive yourself to the hospital. Document Released: 12/16/2004 Document Revised: 03/25/2014 Document Reviewed: 05/11/2013 [employer] Patient Information 2015 [employer]. This information is not intended to replace advice given   to you by your health care provider. Make sure you discuss any questions you have with your health care provider.  

## 2014-09-30 NOTE — Addendum Note (Signed)
Addended by: Mena Goes on: 09/30/2014 05:06 PM   Modules accepted: Orders

## 2014-09-30 NOTE — Progress Notes (Signed)
Established Carotid Patient   History of Present Illness  Thomas Gray is a 73 y.o. male patient of Dr. [name] who is status post left CEA in 2011, had 4 vessel CABG the same day, denies having an MI. He returns today for follow up.  The patient denies any history of TIA or stroke symptoms, specifically the patient denies a history of amaurosis fugax or monocular blindness, denies a history unilateral  of facial drooping, denies a history of hemiplegia, and denies a history of receptive or expressive aphasia. The patient also denies claudication symptoms.  Patient denies New Medical or Surgical History. He states he has slightly elevated triglycerides. He attends cardiac rehab 4 days/week.  He states his his blood pressure usually runs 120's/50's, and that he has white coat syndrome, his blood pressure is slightly elevated now.   Pt Diabetic: No Pt smoker: former smoker, quit 2011  Pt meds include: Statin : Yes Betablocker: Yes ASA: Yes Other anticoagulants/antiplatelets: no  Past Medical History  Diagnosis Date  . Hypertension   . Hyperlipidemia   . 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 CAD (coronary artery disease)     12/29/2011 normal R/S nuclear study;08/05/2010 cath smooth 50% ostial left main 60% distal left main, 60% ca+ smooth prox LAD, 70% ramus intermed., 90% prox. near ostial, 80% prox. CX, diffuse 30%prox. RCA, 95% eccentric stenosis mid RCA 5o0-90%distal RCA; 50% distal aortic stenosis  . COPD (chronic obstructive pulmonary disease)   . 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 Early and Hendrickson  . Heart murmur     Echo:  Mitral regurg. EF 55-65%  . H/O discoid lupus erythematosus   . H/O discoid lupus erythematosus   . Anemia     Social History History  Substance Use Topics  . Smoking status: Former Smoker -- 1.00 packs/day    Types: Cigarettes    Quit date: 08/07/2010  . Smokeless tobacco: Never Used  .  Alcohol Use: Yes     Comment: 1 glass of wine weekly    Family History Family History  Problem Relation Age of Onset  . Hypertension Mother   . Diabetes Mother   . Heart disease Mother     Surgical History Past Surgical History  Procedure Laterality Date  . Tonsillectomy  1976  . Carotid endarterectomy  08/07/2010  . Coronary artery bypass graft  08/07/2010  . Cholecystectomy  1991    gall bladder    No Known Allergies  Current Outpatient Prescriptions  Medication Sig Dispense Refill  . aspirin EC 81 MG tablet Take 81 mg by mouth daily.      Marland Kitchen atorvastatin (LIPITOR) 20 MG tablet Take 1 tablet (20 mg total) by mouth daily. 90 tablet 3  . hydrochlorothiazide (HYDRODIURIL) 25 MG tablet Take 1 tablet (25 mg total) by mouth daily. 30 tablet 10  . losartan (COZAAR) 100 MG tablet Take 1 tablet (100 mg total) by mouth daily. 90 tablet 2  . metoprolol (LOPRESSOR) 50 MG tablet Take 1 tablet (50 mg total) by mouth 2 (two) times daily. 180 tablet 2  . omeprazole (PRILOSEC) 40 MG capsule Take 1 capsule (40 mg total) by mouth daily. 90 capsule 2  . potassium chloride (K-DUR) 10 MEQ tablet Take 1 tablet (10 mEq total) by mouth daily. 90 tablet 2  . sildenafil (VIAGRA) 50 MG tablet Take 1 tablet (50 mg total) by mouth daily as needed for erectile dysfunction.  10 tablet 3   No current facility-administered medications for this visit.    Review of Systems : See HPI for pertinent positives and negatives.  Physical Examination  Filed Vitals:   09/30/14 1539 09/30/14 1541  BP: 165/69 169/69  Pulse: 66 69  Resp:  16  Height:  5\' 6"  (1.676 m)  Weight:  189 lb (85.73 kg)  SpO2:  99%   Body mass index is 30.52 kg/(m^2).  General: WDWN obese male in NAD GAIT: normal Eyes: PERRLA Pulmonary: CTAB, Negative Rales, Negative rhonchi, & Negative wheezing.  Cardiac: regular rhythm, no detected murmur.  VASCULAR EXAM Carotid Bruits Left Right   Negative Negative   Radial pulses  are 2+ palpable and equal.      LE Pulses LEFT RIGHT   POPLITEAL not palpable  not palpable   POSTERIOR TIBIAL  not palpable  not palpable    DORSALIS PEDIS  ANTERIOR TIBIAL not palpable  faintly palpable     Gastrointestinal: soft, nontender, BS WNL, no r/g, no palpated masses.  Musculoskeletal: Negative muscle atrophy/wasting. M/S 5/5 throughout, Extremities without ischemic changes.  Neurologic: A&O X 3; Appropriate Affect ; SENSATION ;normal;  Speech is normal CN 2-12 intact, Pain and light touch intact in extremities, Motor exam as listed above.    Non-Invasive Vascular Imaging CAROTID DUPLEX 09/30/2014   CEREBROVASCULAR DUPLEX EVALUATION    INDICATION: Carotid Endarterectomy    PREVIOUS INTERVENTION(S): Left CEA on 07/28/2010    DUPLEX EXAM:     RIGHT  LEFT  Peak Systolic Velocities (cm/s) End Diastolic Velocities (cm/s) Plaque LOCATION Peak Systolic Velocities (cm/s) End Diastolic Velocities (cm/s) Plaque  70 14  CCA PROXIMAL 108 20   73 15  CCA MID 80 18 sp  73 18 HT CCA DISTAL 83 19   122 13  ECA 120 14   203 56 HT ICA PROXIMAL 88 28   128 29  ICA MID 95 31   98 24  ICA DISTAL 89 28     2.8 ICA / CCA Ratio (PSV) Not calculated  antegrade Vertebral Flow antegrade  621 Brachial Systolic Pressure (mmHg) 308  Within Normal Limits Brachial Artery Waveforms Within Normal Limits    Plaque Morphology:  HM = Homogeneous, HT = Heterogeneous, CP = Calcific Plaque, SP = Smooth Plaque, IP = Irregular Plaque     ADDITIONAL FINDINGS: No significant stenosis of the bilateral external or common carotid arteries.    IMPRESSION: 1. Patent left carotid endarterectomy site with no left internal carotid artery stenosis. 2. Doppler velocities suggest a 40-59% diameter reduction, upper end of range.     Compared  to the previous exam:  Mild increase in right proximal ICA PSV when compared to previous exam.      Assessment: Thomas Gray is a 73 y.o. male who presents with asymptomatic 40-59% right ICA diameter reduction (upper end of range) and patent left carotid endarterectomy site with no left internal carotid artery stenosis. Mild increase in right proximal ICA PSV when compared to previous exam.   Plan: Follow-up in 1 year with Carotid Duplex scan.   I discussed in depth with the patient the nature of atherosclerosis, and emphasized the importance of maximal medical management including strict control of blood pressure, blood glucose, and lipid levels, obtaining regular exercise, and continued cessation of smoking.  The patient is aware that without maximal medical management the underlying atherosclerotic disease process will progress, limiting the benefit of any interventions. The patient was given information about  stroke prevention and what symptoms should prompt the patient to seek immediate medical care. Thank you for allowing [place] to participate in this patient's care.  Clemon Chambers, RN, MSN, FNP-C [facility] Office: [phone]  Clinic Physician: Trula Slade  09/30/2014 3:19 PM

## 2014-10-08 DIAGNOSIS — L719 Rosacea, unspecified: Secondary | ICD-10-CM | POA: Diagnosis not present

## 2014-10-08 DIAGNOSIS — D1801 Hemangioma of skin and subcutaneous tissue: Secondary | ICD-10-CM | POA: Diagnosis not present

## 2014-10-24 ENCOUNTER — Telehealth: Payer: Self-pay | Admitting: *Deleted

## 2014-10-24 NOTE — Telephone Encounter (Signed)
Follow up    Patient had flu shot first week of oct. [facility].

## 2014-10-24 NOTE — Telephone Encounter (Signed)
New Message  Left vm for pt concerning flu shot 

## 2014-11-05 ENCOUNTER — Other Ambulatory Visit: Payer: Self-pay | Admitting: Cardiovascular Disease

## 2014-12-21 ENCOUNTER — Other Ambulatory Visit: Payer: Self-pay | Admitting: Cardiovascular Disease

## 2014-12-23 ENCOUNTER — Other Ambulatory Visit: Payer: Self-pay | Admitting: Cardiovascular Disease

## 2014-12-23 NOTE — Telephone Encounter (Signed)
Rx refill sent to patient pharmacy   

## 2014-12-24 NOTE — Telephone Encounter (Signed)
Rx(s) sent to pharmacy electronically.  

## 2015-01-07 ENCOUNTER — Ambulatory Visit (INDEPENDENT_AMBULATORY_CARE_PROVIDER_SITE_OTHER): Payer: Medicare Other | Admitting: Cardiovascular Disease

## 2015-01-07 ENCOUNTER — Encounter: Payer: Self-pay | Admitting: Cardiovascular Disease

## 2015-01-07 VITALS — BP 144/64 | HR 64 | Ht 66.0 in | Wt 190.1 lb

## 2015-01-07 DIAGNOSIS — E785 Hyperlipidemia, unspecified: Secondary | ICD-10-CM | POA: Diagnosis not present

## 2015-01-07 DIAGNOSIS — R5383 Other fatigue: Secondary | ICD-10-CM | POA: Diagnosis not present

## 2015-01-07 DIAGNOSIS — R350 Frequency of micturition: Secondary | ICD-10-CM | POA: Diagnosis not present

## 2015-01-07 DIAGNOSIS — I10 Essential (primary) hypertension: Secondary | ICD-10-CM | POA: Diagnosis not present

## 2015-01-07 DIAGNOSIS — I2510 Atherosclerotic heart disease of native coronary artery without angina pectoris: Secondary | ICD-10-CM | POA: Diagnosis not present

## 2015-01-07 DIAGNOSIS — D689 Coagulation defect, unspecified: Secondary | ICD-10-CM | POA: Diagnosis not present

## 2015-01-07 DIAGNOSIS — Z79899 Other long term (current) drug therapy: Secondary | ICD-10-CM | POA: Diagnosis not present

## 2015-01-07 MED ORDER — SILDENAFIL CITRATE 50 MG PO TABS: 50.0000 mg | ORAL_TABLET | Freq: Every day | ORAL | Status: DC | PRN

## 2015-01-07 NOTE — Progress Notes (Signed)
01/07/2015 [name]   10/13/1941  [id number]  Primary Physician [name], MD Primary Cardiologist: [name] MD Thomas Gray   HPI:  Mr. Thomas Gray is a 74 year old moderately overweight the first Caucasian male father of 3 sons, grandfather to 5 granddaughters who was formerly a patient of Dr. Terance Ice.He does not have a primary care physician. A left-sided pneumonia or ago. His cardiac risk factor profile is significant for 50 pack years of tobacco abuse having quit 4 years ago at the time of his coronary artery bypass grafting. History of hypertension and hyperlipidemia. There is no family history. He has never had a heart attack or stroke. He denies chest pain or shortness of breath. He had coronary artery bypass grafting x4 by Dr. Merilynn Finland in 2011 concomitant left carotid endarterectomy performed by Dr. Curt Jews. A Myoview stress test performed in 2013 was nonischemic. He denies chest pain or shortness of breath.   Current Outpatient Prescriptions  Medication Sig Dispense Refill  . aspirin EC 81 MG tablet Take 81 mg by mouth daily.      Marland Kitchen atorvastatin (LIPITOR) 20 MG tablet Take 1 tablet (20 mg total) by mouth daily. 90 tablet 3  . hydrochlorothiazide (HYDRODIURIL) 25 MG tablet Take 1 tablet (25 mg total) by mouth daily. MUST KEEP APPOINTMENT 01/07/2015 WITH DR Sharona Rovner FOR FUTURE REFILLS OR 90-DAY SUPPLY 30 tablet 0  . losartan (COZAAR) 100 MG tablet TAKE 1 TABLET BY MOUTH EVERY DAY 90 tablet 2  . metoprolol (LOPRESSOR) 50 MG tablet Take 1 tablet (50 mg total) by mouth 2 (two) times daily. 180 tablet 2  . omeprazole (PRILOSEC) 40 MG capsule Take 1 capsule (40 mg total) by mouth daily. 90 capsule 2  . potassium chloride (K-DUR) 10 MEQ tablet Take 1 tablet (10 mEq total) by mouth daily. 90 tablet 2  . sildenafil (VIAGRA) 50 MG tablet Take 1 tablet (50 mg total) by mouth daily as needed for erectile dysfunction. 10 tablet 3   No current  facility-administered medications for this visit.    No Known Allergies  History   Social History  . Marital Status: Single    Spouse Name: N/A  . Number of Children: N/A  . Years of Education: N/A   Occupational History  . Not on file.   Social History Main Topics  . Smoking status: Former Smoker -- 1.00 packs/day    Types: Cigarettes    Quit date: 08/07/2010  . Smokeless tobacco: Never Used  . Alcohol Use: Yes     Comment: 1 glass of wine weekly  . Drug Use: No  . Sexual Activity: Not on file   Other Topics Concern  . Not on file   Social History Narrative     Review of Systems: General: negative for chills, fever, night sweats or weight changes.  Cardiovascular: negative for chest pain, dyspnea on exertion, edema, orthopnea, palpitations, paroxysmal nocturnal dyspnea or shortness of breath Dermatological: negative for rash Respiratory: negative for cough or wheezing Urologic: negative for hematuria Abdominal: negative for nausea, vomiting, diarrhea, bright red blood per rectum, melena, or hematemesis Neurologic: negative for visual changes, syncope, or dizziness All other systems reviewed and are otherwise negative except as noted above.    Blood pressure 144/64, pulse 64, height 5\' 6"  (1.676 m), weight 190 lb 1.6 oz (86.229 kg).  General appearance: alert and no distress Neck: no adenopathy, no JVD, supple, symmetrical, trachea midline, thyroid not enlarged, symmetric, no tenderness/mass/nodules and soft  right carotid bruit Lungs: clear to auscultation bilaterally Heart: regular rate and rhythm, S1, S2 normal, no murmur, click, rub or gallop Extremities: extremities normal, atraumatic, no cyanosis or edema  EKG normal sinus rhythm at 64 without ST or T-wave changes. 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 Merilynn Finland in 2011. A Myoview stress test  performed in 2013 was low risk and nonischemic. He denies chest pain or shortness of breath.   Other and unspecified hyperlipidemia History of hyperlipidemia on atorvastatin 20 mg a day. We will recheck a lipid and liver profile   Occlusion and stenosis of carotid artery History of carotid endarterectomy performed simultaneously with his bypass graft operation by Dr. Sherren Mocha Early who follows this is office by duplex ultrasound.   Essential hypertension, benign History of hypertension with blood pressure noted today at 144/64. He is on hydrochlorothiazide, metoprolol and losartan. Continue current meds at current dosing       [name] MD [facility], [facility] 01/07/2015 11:18 AM

## 2015-01-07 NOTE — Assessment & Plan Note (Signed)
History of hyperlipidemia on atorvastatin 20 mg a day. We will recheck a lipid and liver profile

## 2015-01-07 NOTE — Patient Instructions (Signed)
Your physician wants you to follow-up in 1 year with Dr. Gwenlyn Found. You will receive a reminder letter in the mail 2 months in advance. If you do not receive a letter, please call our office to schedule the follow-up appointment.  Dr. Gwenlyn Found has ordered for you to have lab work done in the next few days and you must be FASTING.  Your prescription you requested a refill for has been sent in electronically.

## 2015-01-07 NOTE — Assessment & Plan Note (Signed)
History of CAD status post coronary artery bypass grafting X 4 by Dr. Merilynn Finland in 2011. A Myoview stress test performed in 2013 was low risk and nonischemic. He denies chest pain or shortness of breath.

## 2015-01-07 NOTE — Assessment & Plan Note (Signed)
History of carotid endarterectomy performed simultaneously with his bypass graft operation by Dr. Sherren Mocha Early who follows this is office by duplex ultrasound.

## 2015-01-07 NOTE — Assessment & Plan Note (Signed)
History of hypertension with blood pressure noted today at 144/64. He is on hydrochlorothiazide, metoprolol and losartan. Continue current meds at current dosing

## 2015-01-15 DIAGNOSIS — I2510 Atherosclerotic heart disease of native coronary artery without angina pectoris: Secondary | ICD-10-CM | POA: Diagnosis not present

## 2015-01-15 DIAGNOSIS — D689 Coagulation defect, unspecified: Secondary | ICD-10-CM | POA: Diagnosis not present

## 2015-01-15 DIAGNOSIS — R350 Frequency of micturition: Secondary | ICD-10-CM | POA: Diagnosis not present

## 2015-01-15 DIAGNOSIS — Z79899 Other long term (current) drug therapy: Secondary | ICD-10-CM | POA: Diagnosis not present

## 2015-01-15 DIAGNOSIS — E785 Hyperlipidemia, unspecified: Secondary | ICD-10-CM | POA: Diagnosis not present

## 2015-01-15 DIAGNOSIS — R5383 Other fatigue: Secondary | ICD-10-CM | POA: Diagnosis not present

## 2015-01-15 DIAGNOSIS — I10 Essential (primary) hypertension: Secondary | ICD-10-CM | POA: Diagnosis not present

## 2015-01-16 ENCOUNTER — Encounter: Payer: Self-pay | Admitting: Cardiovascular Disease

## 2015-01-19 ENCOUNTER — Other Ambulatory Visit: Payer: Self-pay | Admitting: Cardiovascular Disease

## 2015-01-20 NOTE — Telephone Encounter (Signed)
Rx has been sent to the pharmacy electronically. ° °

## 2015-01-21 ENCOUNTER — Other Ambulatory Visit: Payer: Self-pay | Admitting: Cardiovascular Disease

## 2015-01-21 NOTE — Telephone Encounter (Signed)
Rx refill sent to patient pharmacy   

## 2015-01-27 ENCOUNTER — Other Ambulatory Visit: Payer: Self-pay | Admitting: Cardiovascular Disease

## 2015-03-13 ENCOUNTER — Other Ambulatory Visit: Payer: Self-pay | Admitting: Cardiovascular Disease

## 2015-03-13 NOTE — Telephone Encounter (Signed)
Rx(s) sent to pharmacy electronically.  

## 2015-05-19 ENCOUNTER — Other Ambulatory Visit: Payer: Self-pay

## 2015-07-28 ENCOUNTER — Other Ambulatory Visit: Payer: Self-pay | Admitting: Cardiovascular Disease

## 2015-08-08 DIAGNOSIS — Z23 Encounter for immunization: Secondary | ICD-10-CM | POA: Diagnosis not present

## 2015-10-03 DIAGNOSIS — H524 Presbyopia: Secondary | ICD-10-CM | POA: Diagnosis not present

## 2015-10-03 DIAGNOSIS — H2513 Age-related nuclear cataract, bilateral: Secondary | ICD-10-CM | POA: Diagnosis not present

## 2015-10-14 ENCOUNTER — Encounter (HOSPITAL_COMMUNITY): Payer: Medicare Other

## 2015-10-14 ENCOUNTER — Ambulatory Visit: Payer: Medicare Other | Admitting: Family

## 2015-11-02 ENCOUNTER — Other Ambulatory Visit: Payer: Self-pay | Admitting: Cardiovascular Disease

## 2015-11-03 NOTE — Telephone Encounter (Signed)
REFILL 

## 2015-11-20 ENCOUNTER — Encounter: Payer: Self-pay | Admitting: Family

## 2015-11-27 ENCOUNTER — Encounter: Payer: Self-pay | Admitting: Family

## 2015-11-27 ENCOUNTER — Ambulatory Visit (HOSPITAL_COMMUNITY)
Admission: RE | Admit: 2015-11-27 | Discharge: 2015-11-27 | Disposition: A | Discharge status: Home / Self Care | Payer: Medicare Other | Source: Ambulatory Visit | Attending: Family | Admitting: Family

## 2015-11-27 ENCOUNTER — Ambulatory Visit (INDEPENDENT_AMBULATORY_CARE_PROVIDER_SITE_OTHER): Payer: Medicare Other | Admitting: Family

## 2015-11-27 VITALS — BP 145/66 | HR 62 | Temp 96.9°F | Resp 20 | Ht 66.0 in | Wt 188.2 lb

## 2015-11-27 DIAGNOSIS — Z48812 Encounter for surgical aftercare following surgery on the circulatory system: Secondary | ICD-10-CM | POA: Insufficient documentation

## 2015-11-27 DIAGNOSIS — Z9889 Other specified postprocedural states: Secondary | ICD-10-CM

## 2015-11-27 DIAGNOSIS — I10 Essential (primary) hypertension: Secondary | ICD-10-CM | POA: Insufficient documentation

## 2015-11-27 DIAGNOSIS — E785 Hyperlipidemia, unspecified: Secondary | ICD-10-CM | POA: Diagnosis not present

## 2015-11-27 DIAGNOSIS — I6523 Occlusion and stenosis of bilateral carotid arteries: Secondary | ICD-10-CM

## 2015-11-27 DIAGNOSIS — I6521 Occlusion and stenosis of right carotid artery: Secondary | ICD-10-CM | POA: Insufficient documentation

## 2015-11-27 NOTE — Progress Notes (Signed)
Chief Complaint: Extracranial Carotid Artery Stenosis   History of Present Illness  Thomas Gray is a 75 y.o. male patient of Dr. [name] who is status post left CEA in 2011, had 4 vessel CABG the same day, denies having an MI. He returns today for follow up.  The patient denies any history of TIA or stroke symptoms, specifically the patient denies a history of amaurosis fugax or monocular blindness, denies a history unilateral of facial drooping, denies a history of hemiplegia, and denies a history of receptive or expressive aphasia. The patient also denies claudication symptoms.  Patient denies New Medical or Surgical History. He states he has slightly elevated triglycerides. He attends cardiac rehab 4 days/week.  He states his his blood pressure usually runs 120's/50's, and that he has white coat syndrome, his blood pressure is slightly elevated now.   Pt Diabetic: No Pt smoker: former smoker, quit 2011  Pt meds include: Statin : Yes Betablocker: Yes ASA: Yes Other anticoagulants/antiplatelets: no    Past Medical History  Diagnosis Date  . Hypertension   . Hyperlipidemia   . 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 CAD (coronary artery disease)     12/29/2011 normal R/S nuclear study;08/05/2010 cath smooth 50% ostial left main 60% distal left main, 60% ca+ smooth prox LAD, 70% ramus intermed., 90% prox. near ostial, 80% prox. CX, diffuse 30%prox. RCA, 95% eccentric stenosis mid RCA 5o0-90%distal RCA; 50% distal aortic stenosis  . COPD (chronic obstructive pulmonary disease) ([place])   . 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 Early and Hendrickson  . Heart murmur     Echo:  Mitral regurg. EF 55-65%  . H/O discoid lupus erythematosus   . H/O discoid lupus erythematosus   . Anemia     Social History Social History  Substance Use Topics  . Smoking status: Former Smoker -- 1.00 packs/day    Types: Cigarettes    Quit date:  08/07/2010  . Smokeless tobacco: Never Used  . Alcohol Use: Yes     Comment: 1 glass of wine weekly    Family History Family History  Problem Relation Age of Onset  . Hypertension Mother   . Diabetes Mother   . Heart disease Mother     After age 67    Surgical History Past Surgical History  Procedure Laterality Date  . Tonsillectomy  1976  . Carotid endarterectomy  08/07/2010  . Coronary artery bypass graft  08/07/2010  . Cholecystectomy  1991    gall bladder  . Tooth extraction  Jan. and Aug.  2015    No Known Allergies  Current Outpatient Prescriptions  Medication Sig Dispense Refill  . aspirin EC 81 MG tablet Take 81 mg by mouth daily.      Marland Kitchen atorvastatin (LIPITOR) 20 MG tablet Take 1 tablet (20 mg total) by mouth daily. 90 tablet 3  . hydrochlorothiazide (HYDRODIURIL) 25 MG tablet TAKE 1 TABLET BY MOUTH EVERY DAY 30 tablet 2  . losartan (COZAAR) 100 MG tablet TAKE 1 TABLET BY MOUTH EVERY DAY 90 tablet 1  . metoprolol (LOPRESSOR) 50 MG tablet TAKE 1 TABLET (50 MG TOTAL) BY MOUTH 2 (TWO) TIMES DAILY. 180 tablet 3  . omeprazole (PRILOSEC) 40 MG capsule TAKE 1 CAPSULE (40 MG TOTAL) BY MOUTH DAILY. 90 capsule 3  . potassium chloride (K-DUR) 10 MEQ tablet Take 1 tablet (10 mEq total) by mouth daily. (Patient not taking: Reported on  11/27/2015) 90 tablet 2  . sildenafil (VIAGRA) 50 MG tablet Take 1 tablet (50 mg total) by mouth daily as needed for erectile dysfunction. (Patient not taking: Reported on 11/27/2015) 10 tablet 3   No current facility-administered medications for this visit.    Review of Systems : See HPI for pertinent positives and negatives.  Physical Examination  Filed Vitals:   11/27/15 1147 11/27/15 1148 11/27/15 1156 11/27/15 1159  BP: 175/65 135/50 135/50 145/66  Pulse: 62     Temp: 96.9 F (36.1 C)     TempSrc: Oral     Resp: 20     Height: 5\' 6"  (1.676 m)     Weight: 188 lb 3.2 oz (85.367 kg)      Body mass index is 30.39 kg/(m^2).  General: WDWN  obese male in NAD GAIT: normal Eyes: PERRLA Pulmonary: CTAB, no rales,  rhonchi, or wheezing.  Cardiac: regular rhythm, no detected murmur.  VASCULAR EXAM Carotid Bruits Left Right   Negative Negative   Radial pulses are 2+ palpable and equal.      LE Pulses LEFT RIGHT   POPLITEAL not palpable  not palpable   POSTERIOR TIBIAL   palpable   palpable    DORSALIS PEDIS  ANTERIOR TIBIAL  palpable   palpable     Gastrointestinal: soft, nontender, BS WNL, no r/g, no palpated masses.  Musculoskeletal: Negative muscle atrophy/wasting. M/S 5/5 throughout, Extremities without ischemic changes.  Neurologic: A&O X 3; Appropriate Affect ; SENSATION ;normal;  Speech is normal CN 2-12 intact, Pain and light touch intact in extremities, Motor exam as listed above.           Non-Invasive Vascular Imaging CAROTID DUPLEX 11/27/2015   Right ICA: 40 - 59 % stenosis. Left ICA: Widely patent CEA site with no restenosis. Bilateral vertebral artery is antegrade. No significant change compared to prior exam.   Assessment: Thomas Gray is a 75 y.o. male who is status post left CEA in 2011, had 4 vessel CABG the same day, denies having an MI. He has no hx of stroke or TIA. Today's carotid duplex suggests 40 - 59 % right ICA stenosis and widely patent left ICA, CEA site, with no restenosis. Bilateral vertebral artery is antegrade. No significant change compared to prior exam.   Plan: Follow-up in 1 year with Carotid Duplex scan.   I discussed in depth with the patient the nature of atherosclerosis, and emphasized the importance of maximal medical management including strict control of blood pressure, blood glucose, and lipid levels, obtaining regular exercise, and continued cessation of smoking.  The  patient is aware that without maximal medical management the underlying atherosclerotic disease process will progress, limiting the benefit of any interventions. The patient was given information about stroke prevention and what symptoms should prompt the patient to seek immediate medical care. Thank you for allowing [place] to participate in this patient's care.  Clemon Chambers, RN, MSN, FNP-C [facility] [place] Office: [phone]  Clinic Physician: Oneida Alar  11/27/2015 12:06 PM

## 2015-11-27 NOTE — Progress Notes (Signed)
Filed Vitals:   11/27/15 1147 11/27/15 1148 11/27/15 1156 11/27/15 1159  BP: 175/65 135/50 135/50 145/66  Pulse: 62     Temp: 96.9 F (36.1 C)     TempSrc: Oral     Resp: 20     Height: 5\' 6"  (1.676 m)     Weight: 188 lb 3.2 oz (85.367 kg)

## 2015-11-27 NOTE — Patient Instructions (Signed)
Stroke Prevention Some medical conditions and behaviors are associated with an increased chance of having a stroke. You may prevent a stroke by making healthy choices and managing medical conditions. HOW CAN I REDUCE MY RISK OF HAVING A STROKE?   Stay physically active. Get at least 30 minutes of activity on most or all days.  Do not smoke. It may also be helpful to avoid exposure to secondhand smoke.  Limit alcohol use. Moderate alcohol use is considered to be:  No more than 2 drinks per day for men.  No more than 1 drink per day for nonpregnant women.  Eat healthy foods. This involves:  Eating 5 or more servings of fruits and vegetables a day.  Making dietary changes that address high blood pressure (hypertension), high cholesterol, diabetes, or obesity.  Manage your cholesterol levels.  Making food choices that are high in fiber and low in saturated fat, trans fat, and cholesterol may control cholesterol levels.  Take any prescribed medicines to control cholesterol as directed by your health care provider.  Manage your diabetes.  Controlling your carbohydrate and sugar intake is recommended to manage diabetes.  Take any prescribed medicines to control diabetes as directed by your health care provider.  Control your hypertension.  Making food choices that are low in salt (sodium), saturated fat, trans fat, and cholesterol is recommended to manage hypertension.  Ask your health care provider if you need treatment to lower your blood pressure. Take any prescribed medicines to control hypertension as directed by your health care provider.  If you are 18-39 years of age, have your blood pressure checked every 3-5 years. If you are 40 years of age or older, have your blood pressure checked every year.  Maintain a healthy weight.  Reducing calorie intake and making food choices that are low in sodium, saturated fat, trans fat, and cholesterol are recommended to manage  weight.  Stop drug abuse.  Avoid taking birth control pills.  Talk to your health care provider about the risks of taking birth control pills if you are over 35 years old, smoke, get migraines, or have ever had a blood clot.  Get evaluated for sleep disorders (sleep apnea).  Talk to your health care provider about getting a sleep evaluation if you snore a lot or have excessive sleepiness.  Take medicines only as directed by your health care provider.  For some people, aspirin or blood thinners (anticoagulants) are helpful in reducing the risk of forming abnormal blood clots that can lead to stroke. If you have the irregular heart rhythm of atrial fibrillation, you should be on a blood thinner unless there is a good reason you cannot take them.  Understand all your medicine instructions.  Make sure that other conditions (such as anemia or atherosclerosis) are addressed. SEEK IMMEDIATE MEDICAL CARE IF:   You have sudden weakness or numbness of the face, arm, or leg, especially on one side of the body.  Your face or eyelid droops to one side.  You have sudden confusion.  You have trouble speaking (aphasia) or understanding.  You have sudden trouble seeing in one or both eyes.  You have sudden trouble walking.  You have dizziness.  You have a loss of balance or coordination.  You have a sudden, severe headache with no known cause.  You have new chest pain or an irregular heartbeat. Any of these symptoms may represent a serious problem that is an emergency. Do not wait to see if the symptoms will   go away. Get medical help at once. Call your local emergency services (911 in [place] Do not drive yourself to the hospital.   This information is not intended to replace advice given to you by your health care provider. Make sure you discuss any questions you have with your health care provider.   Document Released: 12/16/2004 Document Revised: 11/29/2014 Document Reviewed:  05/11/2013 Elsevier Interactive Patient Education 2016 [employer]  

## 2016-01-19 ENCOUNTER — Other Ambulatory Visit: Payer: Self-pay | Admitting: Cardiovascular Disease

## 2016-01-19 NOTE — Telephone Encounter (Signed)
Rx(s) sent to pharmacy electronically. OV 02/10/16

## 2016-01-22 ENCOUNTER — Encounter: Payer: Self-pay | Admitting: Cardiovascular Disease

## 2016-01-26 ENCOUNTER — Other Ambulatory Visit: Payer: Self-pay | Admitting: Cardiovascular Disease

## 2016-01-26 NOTE — Telephone Encounter (Signed)
Rx(s) sent to pharmacy electronically. OV 02/27/16

## 2016-01-27 ENCOUNTER — Other Ambulatory Visit: Payer: Self-pay | Admitting: Cardiovascular Disease

## 2016-01-27 NOTE — Telephone Encounter (Signed)
Rx request sent to pharmacy.  

## 2016-01-28 ENCOUNTER — Other Ambulatory Visit: Payer: Self-pay | Admitting: Cardiovascular Disease

## 2016-01-28 NOTE — Telephone Encounter (Signed)
Rx(s) sent to pharmacy electronically. OV 02/27/16

## 2016-02-10 ENCOUNTER — Ambulatory Visit: Payer: Medicare Other | Admitting: Cardiovascular Disease

## 2016-02-27 ENCOUNTER — Encounter: Payer: Self-pay | Admitting: Cardiovascular Disease

## 2016-02-27 ENCOUNTER — Ambulatory Visit (INDEPENDENT_AMBULATORY_CARE_PROVIDER_SITE_OTHER): Payer: Medicare Other | Admitting: Cardiovascular Disease

## 2016-02-27 VITALS — BP 180/74 | HR 66 | Ht 66.0 in | Wt 188.8 lb

## 2016-02-27 DIAGNOSIS — Z951 Presence of aortocoronary bypass graft: Secondary | ICD-10-CM | POA: Diagnosis not present

## 2016-02-27 DIAGNOSIS — I6523 Occlusion and stenosis of bilateral carotid arteries: Secondary | ICD-10-CM | POA: Diagnosis not present

## 2016-02-27 DIAGNOSIS — I10 Essential (primary) hypertension: Secondary | ICD-10-CM

## 2016-02-27 NOTE — Assessment & Plan Note (Signed)
History of hyperlipidemia on statin therapy. We will recheck a lipid and liver profile 

## 2016-02-27 NOTE — Progress Notes (Signed)
02/27/2016 [name]   19-Oct-1941  PS:[id number]  Primary Physician Thomas Burow, MD Primary Cardiologist: Thomas Harp MD Thomas Gray   HPI:  Thomas Gray is a 75 year old moderately overweight the first Caucasian male father of 3 sons, grandfather to 79 granddaughters who was formerly a patient of Dr. Terance Gray. I last saw him in the office 01/07/15.He does not have a primary care physician. His cardiac risk factor profile is significant for 50 pack years of tobacco abuse having quit 4 years ago at the time of his coronary artery bypass grafting. History of hypertension and hyperlipidemia. There is no family history. He has never had a heart attack or stroke. He denies chest pain or shortness of breath. He had coronary artery bypass grafting x4 by Dr. Merilynn Gray in 2011 concomitant left carotid endarterectomy performed by Dr. Curt Gray. A Myoview stress test performed in 2013 was nonischemic. He denies chest pain or shortness of breath.   Current Outpatient Prescriptions  Medication Sig Dispense Refill  . aspirin EC 81 MG tablet Take 81 mg by mouth daily.      Marland Kitchen atorvastatin (LIPITOR) 20 MG tablet Take 1 tablet (20 mg total) by mouth daily. 90 tablet 3  . hydrochlorothiazide (HYDRODIURIL) 25 MG tablet TAKE 1 TABLET BY MOUTH EVERY DAY 30 tablet 1  . losartan (COZAAR) 100 MG tablet TAKE 1 TABLET BY MOUTH EVERY DAY 90 tablet 0  . metoprolol (LOPRESSOR) 50 MG tablet TAKE 1 TABLET BY MOUTH TWICE A DAY 180 tablet 0  . omeprazole (PRILOSEC) 40 MG capsule TAKE 1 CAPSULE BY MOUTH DAILY 90 capsule 0   No current facility-administered medications for this visit.    No Known Allergies  Social History   Social History  . Marital Status: Single    Spouse Name: N/A  . Number of Children: N/A  . Years of Education: N/A   Occupational History  . Not on file.   Social History Main Topics  . Smoking status: Former Smoker -- 1.00 packs/day    Types: Cigarettes   Quit date: 08/07/2010  . Smokeless tobacco: Never Used  . Alcohol Use: Yes     Comment: 1 glass of wine weekly  . Drug Use: No  . Sexual Activity: Not on file   Other Topics Concern  . Not on file   Social History Narrative     Review of Systems: General: negative for chills, fever, night sweats or weight changes.  Cardiovascular: negative for chest pain, dyspnea on exertion, edema, orthopnea, palpitations, paroxysmal nocturnal dyspnea or shortness of breath Dermatological: negative for rash Respiratory: negative for cough or wheezing Urologic: negative for hematuria Abdominal: negative for nausea, vomiting, diarrhea, bright red blood per rectum, melena, or hematemesis Neurologic: negative for visual changes, syncope, or dizziness All other systems reviewed and are otherwise negative except as noted above.    Blood pressure 180/74, pulse 66, height 5\' 6"  (1.676 m), weight 188 lb 12.8 oz (85.639 kg).  General appearance: alert and no distress Neck: no adenopathy, no carotid bruit, no JVD, supple, symmetrical, trachea midline and thyroid not enlarged, symmetric, no tenderness/mass/nodules Lungs: clear to auscultation bilaterally Heart: regular rate and rhythm, S1, S2 normal, no murmur, click, rub or gallop Extremities: extremities normal, atraumatic, no cyanosis or edema  EKG normal sinus rhythm at 66 with Q-wave in lead 3 and early R-wave transition. I personally reviewed this EKG  ASSESSMENT AND PLAN:   Occlusion and stenosis of carotid artery Thomas Gray  has a history of left carotid endarterectomy performed in 2011 at the time of his coronary artery bypass grafting by Dr. Donnetta Gray. They follow him by duplex ultrasound annually which was recently done in January of this year revealing a widely patent endarterectomy site. He is neurologically a symptomatic.  S/P CABG x 4 History of coronary artery disease status post coronary artery bypass grafting X 4 by Dr. Roxan Gray in 2011. A  Myoview stress test performed in 2013 was nonischemic. He denies chest pain or shortness of breath.  Essential hypertension, benign History of hypertension blood pressure measured today at 180/74. He says he measures his blood pressure, and it usually runs in the 120/60 range. He is on hydrochlorothiazide, metoprolol and losartan. Continue current meds at current dosing  Other and unspecified hyperlipidemia History of hyperlipidemia on statin therapy. We will recheck a lipid and liver profile      Thomas Harp MD [facility], [facility] 02/27/2016 10:22 AM

## 2016-02-27 NOTE — Assessment & Plan Note (Signed)
History of hypertension blood pressure measured today at 180/74. He says he measures his blood pressure, and it usually runs in the 120/60 range. He is on hydrochlorothiazide, metoprolol and losartan. Continue current meds at current dosing

## 2016-02-27 NOTE — Assessment & Plan Note (Signed)
Mr. Lunder has a history of left carotid endarterectomy performed in 2011 at the time of his coronary artery bypass grafting by Dr. [name]. They follow him by duplex ultrasound annually which was recently done in January of this year revealing a widely patent endarterectomy site. He is neurologically a symptomatic.

## 2016-02-27 NOTE — Assessment & Plan Note (Signed)
History of coronary artery disease status post coronary artery bypass grafting X 4 by Dr. Roxan Hockey in 2011. A Myoview stress test performed in 2013 was nonischemic. He denies chest pain or shortness of breath.

## 2016-02-27 NOTE — Patient Instructions (Signed)
Medication Instructions:  Your physician recommends that you continue on your current medications as directed. Please refer to the Current Medication list given to you today.   Labwork: Your physician recommends that you return for lab work in: FASTING The lab can be found on the FIRST FLOOR of out building in [address]   Testing/Procedures: NONE  Follow-Up: Your physician wants you to follow-up in: [place]. You will receive a reminder letter in the mail two months in advance. If you don't receive a letter, please call our office to schedule the follow-up appointment.   Any Other Special Instructions Will Be Listed Below (If Applicable).     If you need a refill on your cardiac medications before your next appointment, please call your pharmacy.

## 2016-03-05 ENCOUNTER — Other Ambulatory Visit: Payer: Self-pay | Admitting: Cardiovascular Disease

## 2016-03-05 NOTE — Telephone Encounter (Signed)
Rx(s) sent to pharmacy electronically.  

## 2016-03-08 DIAGNOSIS — Z951 Presence of aortocoronary bypass graft: Secondary | ICD-10-CM | POA: Diagnosis not present

## 2016-03-08 DIAGNOSIS — R350 Frequency of micturition: Secondary | ICD-10-CM | POA: Diagnosis not present

## 2016-03-08 DIAGNOSIS — I10 Essential (primary) hypertension: Secondary | ICD-10-CM | POA: Diagnosis not present

## 2016-03-08 DIAGNOSIS — Z79899 Other long term (current) drug therapy: Secondary | ICD-10-CM | POA: Diagnosis not present

## 2016-03-23 ENCOUNTER — Other Ambulatory Visit: Payer: Self-pay | Admitting: Cardiovascular Disease

## 2016-03-23 NOTE — Telephone Encounter (Signed)
REFILL 

## 2016-04-16 ENCOUNTER — Other Ambulatory Visit: Payer: Self-pay | Admitting: Cardiovascular Disease

## 2016-04-16 NOTE — Telephone Encounter (Signed)
Rx(s) sent to pharmacy electronically.  

## 2016-04-19 ENCOUNTER — Other Ambulatory Visit: Payer: Self-pay | Admitting: Cardiovascular Disease

## 2016-04-20 NOTE — Telephone Encounter (Signed)
Rx(s) sent to pharmacy electronically.  

## 2016-08-17 DIAGNOSIS — Z23 Encounter for immunization: Secondary | ICD-10-CM | POA: Diagnosis not present

## 2016-09-11 DIAGNOSIS — J209 Acute bronchitis, unspecified: Secondary | ICD-10-CM | POA: Diagnosis not present

## 2016-10-04 DIAGNOSIS — H25813 Combined forms of age-related cataract, bilateral: Secondary | ICD-10-CM | POA: Diagnosis not present

## 2016-10-28 DIAGNOSIS — R05 Cough: Secondary | ICD-10-CM | POA: Diagnosis not present

## 2016-10-28 DIAGNOSIS — R5381 Other malaise: Secondary | ICD-10-CM | POA: Diagnosis not present

## 2016-10-28 DIAGNOSIS — R509 Fever, unspecified: Secondary | ICD-10-CM | POA: Diagnosis not present

## 2016-11-12 ENCOUNTER — Encounter: Payer: Self-pay | Admitting: Family

## 2016-11-30 ENCOUNTER — Ambulatory Visit (INDEPENDENT_AMBULATORY_CARE_PROVIDER_SITE_OTHER): Payer: Medicare Other | Admitting: Family

## 2016-11-30 ENCOUNTER — Encounter: Payer: Self-pay | Admitting: Family

## 2016-11-30 ENCOUNTER — Ambulatory Visit (HOSPITAL_COMMUNITY)
Admission: RE | Admit: 2016-11-30 | Discharge: 2016-11-30 | Disposition: A | Discharge status: Home / Self Care | Payer: Medicare Other | Source: Ambulatory Visit | Attending: Family | Admitting: Family

## 2016-11-30 VITALS — BP 172/73 | HR 65 | Temp 97.0°F | Resp 18 | Ht 67.0 in | Wt 184.0 lb

## 2016-11-30 DIAGNOSIS — Z48812 Encounter for surgical aftercare following surgery on the circulatory system: Secondary | ICD-10-CM

## 2016-11-30 DIAGNOSIS — Z9889 Other specified postprocedural states: Secondary | ICD-10-CM | POA: Diagnosis not present

## 2016-11-30 DIAGNOSIS — I6521 Occlusion and stenosis of right carotid artery: Secondary | ICD-10-CM | POA: Diagnosis not present

## 2016-11-30 DIAGNOSIS — I6523 Occlusion and stenosis of bilateral carotid arteries: Secondary | ICD-10-CM | POA: Diagnosis not present

## 2016-11-30 DIAGNOSIS — Z87891 Personal history of nicotine dependence: Secondary | ICD-10-CM

## 2016-11-30 LAB — VAS US CAROTID
LCCADSYS: -83 cm/s
LCCAPDIAS: 21 cm/s
LEFT ECA DIAS: -8 cm/s
LICADDIAS: -25 cm/s
LICADSYS: -97 cm/s
LICAPDIAS: -24 cm/s
LICAPSYS: -89 cm/s
Left CCA dist dias: -19 cm/s
Left CCA prox sys: 160 cm/s
RIGHT CCA MID DIAS: 15 cm/s
RIGHT ECA DIAS: -10 cm/s
Right CCA prox dias: 11 cm/s
Right CCA prox sys: 100 cm/s
Right cca dist sys: -141 cm/s

## 2016-11-30 NOTE — Patient Instructions (Signed)
Stroke Prevention Some medical conditions and behaviors are associated with an increased chance of having a stroke. You may prevent a stroke by making healthy choices and managing medical conditions. How can I reduce my risk of having a stroke?  Stay physically active. Get at least 30 minutes of activity on most or all days.  Do not smoke. It may also be helpful to avoid exposure to secondhand smoke.  Limit alcohol use. Moderate alcohol use is considered to be:  No more than 2 drinks per day for men.  No more than 1 drink per day for nonpregnant women.  Eat healthy foods. This involves:  Eating 5 or more servings of fruits and vegetables a day.  Making dietary changes that address high blood pressure (hypertension), high cholesterol, diabetes, or obesity.  Manage your cholesterol levels.  Making food choices that are high in fiber and low in saturated fat, trans fat, and cholesterol may control cholesterol levels.  Take any prescribed medicines to control cholesterol as directed by your health care provider.  Manage your diabetes.  Controlling your carbohydrate and sugar intake is recommended to manage diabetes.  Take any prescribed medicines to control diabetes as directed by your health care provider.  Control your hypertension.  Making food choices that are low in salt (sodium), saturated fat, trans fat, and cholesterol is recommended to manage hypertension.  Ask your health care provider if you need treatment to lower your blood pressure. Take any prescribed medicines to control hypertension as directed by your health care provider.  If you are 18-39 years of age, have your blood pressure checked every 3-5 years. If you are 40 years of age or older, have your blood pressure checked every year.  Maintain a healthy weight.  Reducing calorie intake and making food choices that are low in sodium, saturated fat, trans fat, and cholesterol are recommended to manage  weight.  Stop drug abuse.  Avoid taking birth control pills.  Talk to your health care provider about the risks of taking birth control pills if you are over 35 years old, smoke, get migraines, or have ever had a blood clot.  Get evaluated for sleep disorders (sleep apnea).  Talk to your health care provider about getting a sleep evaluation if you snore a lot or have excessive sleepiness.  Take medicines only as directed by your health care provider.  For some people, aspirin or blood thinners (anticoagulants) are helpful in reducing the risk of forming abnormal blood clots that can lead to stroke. If you have the irregular heart rhythm of atrial fibrillation, you should be on a blood thinner unless there is a good reason you cannot take them.  Understand all your medicine instructions.  Make sure that other conditions (such as anemia or atherosclerosis) are addressed. Get help right away if:  You have sudden weakness or numbness of the face, arm, or leg, especially on one side of the body.  Your face or eyelid droops to one side.  You have sudden confusion.  You have trouble speaking (aphasia) or understanding.  You have sudden trouble seeing in one or both eyes.  You have sudden trouble walking.  You have dizziness.  You have a loss of balance or coordination.  You have a sudden, severe headache with no known cause.  You have new chest pain or an irregular heartbeat. Any of these symptoms may represent a serious problem that is an emergency. Do not wait to see if the symptoms will go away.   Get medical help at once. Call your local emergency services (911 in [place] Do not drive yourself to the hospital. This information is not intended to replace advice given to you by your health care provider. Make sure you discuss any questions you have with your health care provider. Document Released: 12/16/2004 Document Revised: 04/15/2016 Document Reviewed: 05/11/2013 Elsevier  Interactive Patient Education  2017 Elsevier Inc.  

## 2016-11-30 NOTE — Progress Notes (Signed)
Chief Complaint: Follow up Extracranial Carotid Artery Stenosis   History of Present Illness  Thomas Gray is a 76 y.o. male patient of Dr. [name] who is status post left CEA in 2011, had 4 vessel CABG the same day, denies having an MI. He returns today for follow up.  The patient denies any history of TIA or stroke symptoms, specifically the patient denies a history of amaurosis fugax or monocular blindness, unilateralfacial drooping, hemiplegia, or receptive or expressive aphasia. The patient also denies claudication symptoms with walking.  He states he has slightly elevated triglycerides. He attends cardiac rehab 5 days/week.  He states his his blood pressure usually runs 110's/60's, and that he has white coat syndrome, his blood pressure is elevated now.   Pt Diabetic: 8.0, 03-08-16 (review of records) Pt smoker: former smoker, quit 2011  Pt meds include: Statin : Yes Betablocker: Yes ASA: Yes Other anticoagulants/antiplatelets: no    Past Medical History:  Diagnosis Date  . Anemia   . CAD (coronary artery disease)    12/29/2011 normal R/S nuclear study;08/05/2010 cath smooth 50% ostial left main 60% distal left main, 60% ca+ smooth prox LAD, 70% ramus intermed., 90% prox. near ostial, 80% prox. CX, diffuse 30%prox. RCA, 95% eccentric stenosis mid RCA 5o0-90%distal RCA; 50% distal aortic stenosis  . 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 COPD (chronic obstructive pulmonary disease) ([place])   . H/O discoid lupus erythematosus   . H/O discoid lupus erythematosus   . Heart murmur    Echo:  Mitral regurg. EF 55-65%  . Hyperlipidemia   . Hypertension   . 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 Early and Hendrickson    Social History Social History  Substance Use Topics  . Smoking status: Former Smoker    Packs/day: 1.00    Types: Cigarettes    Quit date: 08/07/2010  . Smokeless tobacco: Never Used  . Alcohol use Yes      Comment: 1 glass of wine weekly    Family History Family History  Problem Relation Age of Onset  . Hypertension Mother   . Diabetes Mother   . Heart disease Mother     After age 46    Surgical History Past Surgical History:  Procedure Laterality Date  . CAROTID ENDARTERECTOMY  08/07/2010  . CHOLECYSTECTOMY  1991   gall bladder  . CORONARY ARTERY BYPASS GRAFT  08/07/2010  . TONSILLECTOMY  1976  . TOOTH EXTRACTION  Jan. and Aug.  2015    No Known Allergies  Current Outpatient Prescriptions  Medication Sig Dispense Refill  . aspirin EC 81 MG tablet Take 81 mg by mouth daily.      Marland Kitchen atorvastatin (LIPITOR) 20 MG tablet Take 1 tablet (20 mg total) by mouth daily. 90 tablet 3  . hydrochlorothiazide (HYDRODIURIL) 25 MG tablet TAKE 1 TABLET BY MOUTH EVERY DAY 30 tablet 9  . losartan (COZAAR) 100 MG tablet TAKE 1 TABLET BY MOUTH EVERY DAY 90 tablet 3  . metoprolol (LOPRESSOR) 50 MG tablet TAKE 1 TABLET BY MOUTH TWICE A DAY 180 tablet 3  . omeprazole (PRILOSEC) 40 MG capsule TAKE 1 CAPSULE BY MOUTH DAILY 90 capsule 3  . cefdinir (OMNICEF) 300 MG capsule TAKE 1 CAPSULE BY MOUTH TWICE A DAY FOR 7 DAYS  0  . guaiFENesin-codeine 100-10 MG/5ML syrup TAKE 5ML TO 10ML BY MOUTH EVERY 8 HOURS AS NEEDED FOR COUGH  0  . levofloxacin (LEVAQUIN)  500 MG tablet TAKE 1 TABLET BY MOUTH EVERY DAY FOR 14 DAYS  0   No current facility-administered medications for this visit.     Review of Systems : See HPI for pertinent positives and negatives.  Physical Examination  Vitals:   11/30/16 1034 11/30/16 1040 11/30/16 1042  BP: (!) 181/75 (!) 164/73 (!) 172/73  Pulse: 65 65 65  Resp: 18    Temp: 97 F (36.1 C)    SpO2: 99%    Weight: 184 lb (83.5 kg)    Height: 5\' 7"  (1.702 m)     Body mass index is 28.82 kg/m.  General: WDWN male in NAD GAIT: normal Eyes: PERRLA Pulmonary: Respirations are non labored, CTAB, no rales,  rhonchi, or wheezing.  Cardiac: regular rhythm, no detected  murmur.  VASCULAR EXAM Carotid Bruits Left Right   Negative Negative   Radial pulses are 2+ palpable and equal.      LE Pulses LEFT RIGHT   POPLITEAL not palpable  not palpable   POSTERIOR TIBIAL   palpable   palpable    DORSALIS PEDIS  ANTERIOR TIBIAL  palpable   palpable     Gastrointestinal: soft, nontender, BS WNL, no r/g, no palpated masses.  Musculoskeletal: No muscle atrophy/wasting. M/S 5/5 throughout, Extremities without ischemic changes.  Neurologic: A&O X 3; Appropriate Affect ; SENSATION ;normal;  Speech is normal CN 2-12 intact, Pain and light touch intact in extremities, Motor exam as listed above    Assessment: Thomas Gray is a 76 y.o. male who is status post left CEA in 2011, had 4 vessel CABG the same day, denies having an MI. He has no hx of stroke or TIA.  His atherosclerotic risk factors include uncontrolled DM, no PCP to help pt manage his DM, former smoker, CAD, and COPD.   DATA Today's carotid duplex suggests 60 - 79 % right ICA stenosis and widely patent left ICA, CEA site, with no restenosis. Bilateral vertebral artery flow is antegrade.  Bilateral subclavian artery waveforms are normal.  Increased stenosis of the right ICA, stable in the left ICA, compared to the last exam on 11-27-15.   Plan:  Pt does not have a PCP and has an A1C of 8.0 in April 2017; refer to Internal Medicine in [place] for PCP and to help pt manage his DM.   Follow-up in 6 months with Carotid Duplex scan.   I discussed in depth with the patient the nature of atherosclerosis, and emphasized the importance of maximal medical management including strict control of blood pressure, blood glucose, and lipid levels, obtaining regular exercise, and continued cessation of smoking.  The  patient is aware that without maximal medical management the underlying atherosclerotic disease process will progress, limiting the benefit of any interventions. The patient was given information about stroke prevention and what symptoms should prompt the patient to seek immediate medical care. Thank you for allowing [place] to participate in this patient's care.  Clemon Chambers, RN, MSN, FNP-C [facility] Office: [facility] Physician: Early  11/30/16 10:46 AM

## 2016-12-03 ENCOUNTER — Other Ambulatory Visit: Payer: Self-pay

## 2016-12-23 DIAGNOSIS — J309 Allergic rhinitis, unspecified: Secondary | ICD-10-CM | POA: Insufficient documentation

## 2016-12-23 DIAGNOSIS — Z8719 Personal history of other diseases of the digestive system: Secondary | ICD-10-CM | POA: Insufficient documentation

## 2016-12-24 DIAGNOSIS — I10 Essential (primary) hypertension: Secondary | ICD-10-CM | POA: Diagnosis not present

## 2016-12-24 DIAGNOSIS — E119 Type 2 diabetes mellitus without complications: Secondary | ICD-10-CM | POA: Diagnosis not present

## 2016-12-24 DIAGNOSIS — I739 Peripheral vascular disease, unspecified: Secondary | ICD-10-CM | POA: Insufficient documentation

## 2016-12-24 DIAGNOSIS — I2510 Atherosclerotic heart disease of native coronary artery without angina pectoris: Secondary | ICD-10-CM | POA: Diagnosis not present

## 2016-12-24 DIAGNOSIS — E782 Mixed hyperlipidemia: Secondary | ICD-10-CM | POA: Diagnosis not present

## 2016-12-24 DIAGNOSIS — Z125 Encounter for screening for malignant neoplasm of prostate: Secondary | ICD-10-CM | POA: Diagnosis not present

## 2016-12-31 DIAGNOSIS — Z0000 Encounter for general adult medical examination without abnormal findings: Secondary | ICD-10-CM | POA: Diagnosis not present

## 2016-12-31 DIAGNOSIS — I10 Essential (primary) hypertension: Secondary | ICD-10-CM | POA: Diagnosis not present

## 2016-12-31 DIAGNOSIS — E782 Mixed hyperlipidemia: Secondary | ICD-10-CM | POA: Diagnosis not present

## 2016-12-31 DIAGNOSIS — Z23 Encounter for immunization: Secondary | ICD-10-CM | POA: Diagnosis not present

## 2017-01-06 ENCOUNTER — Other Ambulatory Visit: Payer: Self-pay | Admitting: Cardiovascular Disease

## 2017-01-06 NOTE — Telephone Encounter (Signed)
Rx(s) sent to pharmacy electronically.  

## 2017-02-23 ENCOUNTER — Other Ambulatory Visit: Payer: Self-pay | Admitting: Cardiovascular Disease

## 2017-02-23 NOTE — Telephone Encounter (Signed)
Rx(s) sent to pharmacy electronically.  

## 2017-03-09 ENCOUNTER — Ambulatory Visit (INDEPENDENT_AMBULATORY_CARE_PROVIDER_SITE_OTHER): Payer: Medicare Other | Admitting: Cardiovascular Disease

## 2017-03-09 ENCOUNTER — Encounter: Payer: Self-pay | Admitting: Cardiovascular Disease

## 2017-03-09 VITALS — BP 162/68 | HR 65 | Ht 66.0 in | Wt 186.0 lb

## 2017-03-09 DIAGNOSIS — I6523 Occlusion and stenosis of bilateral carotid arteries: Secondary | ICD-10-CM | POA: Diagnosis not present

## 2017-03-09 DIAGNOSIS — Z951 Presence of aortocoronary bypass graft: Secondary | ICD-10-CM | POA: Diagnosis not present

## 2017-03-09 DIAGNOSIS — E7800 Pure hypercholesterolemia, unspecified: Secondary | ICD-10-CM | POA: Diagnosis not present

## 2017-03-09 DIAGNOSIS — I10 Essential (primary) hypertension: Secondary | ICD-10-CM

## 2017-03-09 NOTE — Progress Notes (Signed)
03/09/2017 [name]   22-Sep-1941  [id number]  Primary Physician [place], MD Primary Cardiologist: [name] MD Renae Gloss  HPI:  Thomas Gray is a 76 year old moderately overweight the first Caucasian male father of 3 sons, grandfather to 5 granddaughters who was formerly a patient of Dr. Terance Ice. I last saw him in the office 02/27/16.He does not have a primary care physician. His cardiac risk factor profile is significant for 50 pack years of tobacco abuse having quit 4 years ago at the time of his coronary artery bypass grafting. History of hypertension and hyperlipidemia. There is no family history. He has never had a heart attack or stroke. He denies chest pain or shortness of breath. He had coronary artery bypass grafting x4 by Dr. Merilynn Finland in 2011 concomitant left carotid endarterectomy performed by Dr. Curt Jews. A Myoview stress test performed in 2013 was nonischemic. He denies chest pain or shortness of breath. He apparently recently had carotid Dopplers 11/30/16 the digital progression of disease in his right carotid endarterectomy site.   Current Outpatient Prescriptions  Medication Sig Dispense Refill  . aspirin EC 81 MG tablet Take 81 mg by mouth daily.      Marland Kitchen atorvastatin (LIPITOR) 20 MG tablet TAKE 1 TABLET (20 MG TOTAL) BY MOUTH DAILY. 90 tablet 2  . hydrochlorothiazide (HYDRODIURIL) 25 MG tablet TAKE 1 TABLET BY MOUTH EVERY DAY 30 tablet 2  . levofloxacin (LEVAQUIN) 500 MG tablet TAKE 1 TABLET BY MOUTH EVERY DAY FOR 14 DAYS  0  . losartan (COZAAR) 100 MG tablet TAKE 1 TABLET BY MOUTH EVERY DAY 90 tablet 3  . metoprolol (LOPRESSOR) 50 MG tablet TAKE 1 TABLET BY MOUTH TWICE A DAY 180 tablet 3  . omeprazole (PRILOSEC) 40 MG capsule TAKE 1 CAPSULE BY MOUTH DAILY 90 capsule 3   No current facility-administered medications for this visit.     No Known Allergies  Social History   Social History  . Marital status: Single    Spouse  name: N/A  . Number of children: N/A  . Years of education: N/A   Occupational History  . Not on file.   Social History Main Topics  . Smoking status: Former Smoker    Packs/day: 1.00    Types: Cigarettes    Quit date: 08/07/2010  . Smokeless tobacco: Never Used  . Alcohol use Yes     Comment: 1 glass of wine weekly  . Drug use: No  . Sexual activity: Not on file   Other Topics Concern  . Not on file   Social History Narrative  . No narrative on file     Review of Systems: General: negative for chills, fever, night sweats or weight changes.  Cardiovascular: negative for chest pain, dyspnea on exertion, edema, orthopnea, palpitations, paroxysmal nocturnal dyspnea or shortness of breath Dermatological: negative for rash Respiratory: negative for cough or wheezing Urologic: negative for hematuria Abdominal: negative for nausea, vomiting, diarrhea, bright red blood per rectum, melena, or hematemesis Neurologic: negative for visual changes, syncope, or dizziness All other systems reviewed and are otherwise negative except as noted above.    Blood pressure (!) 162/68, pulse 65, height 5\' 6"  (1.676 m), weight 186 lb (84.4 kg).  General appearance: alert and no distress Neck: no adenopathy, no JVD, supple, symmetrical, trachea midline, thyroid not enlarged, symmetric, no tenderness/mass/nodules and Soft right carotid bruit Lungs: clear to auscultation bilaterally Heart: regular rate and rhythm, S1, S2 normal, no  murmur, click, rub or gallop Extremities: extremities normal, atraumatic, no cyanosis or edema  EKG sinus rhythm at 65 without ST or T-wave changes. I personally reviewed this EKG  ASSESSMENT AND PLAN:   Occlusion and stenosis of carotid artery Status post right carotid endarterectomy performed at the time of bypass grafting by Dr. [name] 2011. Recent Dopplers performed 11/30/16 showed progression of his left carotid endarterectomy site and apparently the frequency of  duplex ultrasound has been increased to every 6 months.  S/P CABG x 4 History of CAD status post bypass grafting X 4  in 2011 by Dr. Roxan Hockey. Myoview performed in 2013 was nonischemic. He denies chest pain or shortness of breath.  Essential hypertension, benign History of hypertension blood pressure measured 162/68. He is on hydrochlorothiazide, losartan and metoprolol. Continue current meds at current dosing  Hyperlipidemia History of hyperlipidemia on statin therapy followed by his PCP      [name] MD [facility], [facility] 03/09/2017 2:50 PM

## 2017-03-09 NOTE — Assessment & Plan Note (Signed)
History of hyperlipidemia on statin therapy followed by his PCP 

## 2017-03-09 NOTE — Assessment & Plan Note (Signed)
History of hypertension blood pressure measured 162/68. He is on hydrochlorothiazide, losartan and metoprolol. Continue current meds at current dosing

## 2017-03-09 NOTE — Assessment & Plan Note (Signed)
Status post right carotid endarterectomy performed at the time of bypass grafting by Dr. [name] 2011. Recent Dopplers performed 11/30/16 showed progression of his left carotid endarterectomy site and apparently the frequency of duplex ultrasound has been increased to every 6 months.

## 2017-03-09 NOTE — Patient Instructions (Signed)

## 2017-03-09 NOTE — Assessment & Plan Note (Signed)
History of CAD status post bypass grafting X 4  in 2011 by Dr. Roxan Hockey. Myoview performed in 2013 was nonischemic. He denies chest pain or shortness of breath.

## 2017-03-30 ENCOUNTER — Other Ambulatory Visit: Payer: Self-pay | Admitting: Cardiovascular Disease

## 2017-03-30 NOTE — Telephone Encounter (Signed)
REFILL 

## 2017-04-04 ENCOUNTER — Other Ambulatory Visit: Payer: Self-pay | Admitting: Cardiovascular Disease

## 2017-04-04 NOTE — Telephone Encounter (Signed)
REFILL 

## 2017-04-19 ENCOUNTER — Other Ambulatory Visit: Payer: Self-pay | Admitting: Cardiovascular Disease

## 2017-05-23 ENCOUNTER — Encounter: Payer: Self-pay | Admitting: Family

## 2017-05-30 ENCOUNTER — Ambulatory Visit (INDEPENDENT_AMBULATORY_CARE_PROVIDER_SITE_OTHER): Payer: Medicare Other | Admitting: Family

## 2017-05-30 ENCOUNTER — Encounter: Payer: Self-pay | Admitting: Family

## 2017-05-30 ENCOUNTER — Ambulatory Visit (HOSPITAL_COMMUNITY)
Admission: RE | Admit: 2017-05-30 | Discharge: 2017-05-30 | Disposition: A | Discharge status: Home / Self Care | Payer: Medicare Other | Source: Ambulatory Visit | Attending: Family | Admitting: Family

## 2017-05-30 VITALS — BP 182/67 | HR 58 | Temp 97.0°F | Resp 16 | Ht 66.0 in | Wt 187.4 lb

## 2017-05-30 DIAGNOSIS — Z9889 Other specified postprocedural states: Secondary | ICD-10-CM

## 2017-05-30 DIAGNOSIS — I6523 Occlusion and stenosis of bilateral carotid arteries: Secondary | ICD-10-CM

## 2017-05-30 DIAGNOSIS — Z87891 Personal history of nicotine dependence: Secondary | ICD-10-CM

## 2017-05-30 LAB — VAS US CAROTID
LCCADDIAS: -11 cm/s
LCCAPSYS: 125 cm/s
LEFT ECA DIAS: -7 cm/s
Left CCA dist sys: -80 cm/s
Left CCA prox dias: 17 cm/s
Left ICA dist dias: -20 cm/s
Left ICA dist sys: -79 cm/s
Left ICA prox dias: -16 cm/s
Left ICA prox sys: -56 cm/s
RCCADSYS: -150 cm/s
RCCAPDIAS: 8 cm/s
RIGHT CCA MID DIAS: 9 cm/s
RIGHT ECA DIAS: -6 cm/s
Right CCA prox sys: 86 cm/s

## 2017-05-30 NOTE — Progress Notes (Signed)
Chief Complaint: Follow up Extracranial Carotid Artery Stenosis   History of Present Illness  Thomas Gray is a 76 y.o. male patient of Dr. [name] who is status post left CEA in 2011, had 4 vessel CABG the same day, denies having an MI. He returns today for follow up.  The patient denies any history of TIA or stroke symptoms, specifically he denies a history of amaurosis fugax or monocular blindness, unilateralfacial drooping, hemiplegia, or receptive or expressive aphasia. He also denies claudication symptoms with walking.  He states he has slightly elevated triglycerides. He attends cardiac rehab 5 days/week, 1.5 hours each session.  He states his his blood pressure usually runs 110's/60's, and that he has white coat syndrome, his blood pressure is elevated now.   At his 11-30-16 visit, he did not have a PCP and had an A1C of 8.0 in April 2017; I referred him to Internal Medicine in [place] for PCP and to help pt manage his DM; pt states his PCP is [name], MD.   Pt Diabetic: 8.0, 03-08-16 (review of records), but pt states his last A1C was 7.5.  Pt smoker: former smoker, quit 2011  Pt meds include: Statin : Yes Betablocker: Yes ASA: Yes Other anticoagulants/antiplatelets: no     Past Medical History:  Diagnosis Date  . Anemia   . CAD (coronary artery disease)    12/29/2011 normal R/S nuclear study;08/05/2010 cath smooth 50% ostial left main 60% distal left main, 60% ca+ smooth prox LAD, 70% ramus intermed., 90% prox. near ostial, 80% prox. CX, diffuse 30%prox. RCA, 95% eccentric stenosis mid RCA 5o0-90%distal RCA; 50% distal aortic stenosis  . 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 COPD (chronic obstructive pulmonary disease) ([place])   . H/O discoid lupus erythematosus   . H/O discoid lupus erythematosus   . Heart murmur    Echo:  Mitral regurg. EF 55-65%  . Hyperlipidemia   . Hypertension   . S/P CABG x 4  08/07/2010   Drs. Early and Hendrickson    Social History Social History  Substance Use Topics  . Smoking status: Former Smoker    Packs/day: 1.00    Types: Cigarettes    Quit date: 08/07/2010  . Smokeless tobacco: Never Used  . Alcohol use Yes     Comment: 1 glass of wine weekly    Family History Family History  Problem Relation Age of Onset  . Hypertension Mother   . Diabetes Mother   . Heart disease Mother        After age 50    Surgical History Past Surgical History:  Procedure Laterality Date  . CAROTID ENDARTERECTOMY  08/07/2010  . CHOLECYSTECTOMY  1991   gall bladder  . CORONARY ARTERY BYPASS GRAFT  08/07/2010  . TONSILLECTOMY  1976  . TOOTH EXTRACTION  Jan. and Aug.  2015    No Known Allergies  Current Outpatient Prescriptions  Medication Sig Dispense Refill  . aspirin EC 81 MG tablet Take 81 mg by mouth daily.      Marland Kitchen atorvastatin (LIPITOR) 20 MG tablet TAKE 1 TABLET (20 MG TOTAL) BY MOUTH DAILY. 90 tablet 2  . hydrochlorothiazide (HYDRODIURIL) 25 MG tablet TAKE 1 TABLET BY MOUTH EVERY DAY 30 tablet 11  . levofloxacin (LEVAQUIN) 500 MG tablet TAKE 1 TABLET BY MOUTH EVERY DAY FOR 14 DAYS  0  . losartan (COZAAR) 100 MG tablet TAKE 1 TABLET BY MOUTH EVERY DAY 90 tablet  3  . metoprolol tartrate (LOPRESSOR) 50 MG tablet TAKE 1 TABLET BY MOUTH TWICE A DAY 180 tablet 3  . omeprazole (PRILOSEC) 40 MG capsule TAKE 1 CAPSULE BY MOUTH DAILY 90 capsule 3   No current facility-administered medications for this visit.     Review of Systems : See HPI for pertinent positives and negatives.  Physical Examination  Vitals:   05/30/17 1124 05/30/17 1126  BP: (!) 178/69 (!) 182/67  Pulse: (!) 58   Resp: 16   Temp: (!) 97 F (36.1 C)   TempSrc: Oral   SpO2: 97%   Weight: 187 lb 6.4 oz (85 kg)   Height: 5\' 6"  (1.676 m)    Body mass index is 30.25 kg/m.  General: WDWN male in NAD GAIT:normal Eyes: PERRLA Pulmonary: Respirations are non labored, CTAB, no rales,  rhonchi, or wheezing.  Cardiac: regular rhythm, no detected murmur.  VASCULAR EXAM Carotid Bruits Left Right   Negative Negative   Radial pulses are 2+ palpable and equal.      LE Pulses LEFT RIGHT   POPLITEAL not palpable  not palpable   POSTERIOR TIBIAL  notpalpable  not palpable    DORSALIS PEDIS  ANTERIOR TIBIAL palpable  faintly palpable     Gastrointestinal: soft, nontender, BS WNL, no r/g, no palpated masses.  Musculoskeletal: No muscle atrophy/wasting. M/S 5/5 throughout, Extremities without ischemic changes. Trace pitting edema in both ankles. 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   Neurologic: A&O X 3; Appropriate Affect ; SENSATION ;normal;  Speech is normal CN 2-12 intact, Pain and light touch intact in extremities, Motor exam as listed above     Assessment: Thomas Gray is a 76 y.o. male who is status post left CEA in 2011, had 4 vessel CABG the same day, denies having an MI. He has no hx of stroke or TIA.  His atherosclerotic risk factors include uncontrolled but improving DM, former smoker, CAD, and COPD.   DATA Today's carotid duplex suggests 60 - 79 % right ICA stenosis and widely patent left ICA, CEA site, with no restenosis. Bilateral vertebral artery flow is antegrade.  Bilateral subclavian artery waveforms are normal.  No significant change compared to the last exam on 11-30-16.    Plan:  Follow-up in 6 months with Carotid Duplex scan.   I discussed in depth with the patient the nature of atherosclerosis, and emphasized the importance of maximal medical management including strict control of blood pressure, blood glucose, and lipid levels, obtaining regular exercise, and continued cessation of smoking.  The patient is aware that  without maximal medical management the underlying atherosclerotic disease process will progress, limiting the benefit of any interventions. The patient was given information about stroke prevention and what symptoms should prompt the patient to seek immediate medical care. Thank you for allowing [place] to participate in this patient's care.  Clemon Chambers, RN, MSN, FNP-C [facility] Office: [phone]  Clinic Physician: Early on call  05/30/17 11:33 AM

## 2017-05-30 NOTE — Patient Instructions (Addendum)
Stroke Prevention Some medical conditions and behaviors are associated with an increased chance of having a stroke. You may prevent a stroke by making healthy choices and managing medical conditions. How can I reduce my risk of having a stroke?  Stay physically active. Get at least 30 minutes of activity on most or all days.  Do not smoke. It may also be helpful to avoid exposure to secondhand smoke.  Limit alcohol use. Moderate alcohol use is considered to be: ? No more than 2 drinks per day for men. ? No more than 1 drink per day for nonpregnant women.  Eat healthy foods. This involves: ? Eating 5 or more servings of fruits and vegetables a day. ? Making dietary changes that address high blood pressure (hypertension), high cholesterol, diabetes, or obesity.  Manage your cholesterol levels. ? Making food choices that are high in fiber and low in saturated fat, trans fat, and cholesterol may control cholesterol levels. ? Take any prescribed medicines to control cholesterol as directed by your health care provider.  Manage your diabetes. ? Controlling your carbohydrate and sugar intake is recommended to manage diabetes. ? Take any prescribed medicines to control diabetes as directed by your health care provider.  Control your hypertension. ? Making food choices that are low in salt (sodium), saturated fat, trans fat, and cholesterol is recommended to manage hypertension. ? Ask your health care provider if you need treatment to lower your blood pressure. Take any prescribed medicines to control hypertension as directed by your health care provider. ? If you are 18-39 years of age, have your blood pressure checked every 3-5 years. If you are 40 years of age or older, have your blood pressure checked every year.  Maintain a healthy weight. ? Reducing calorie intake and making food choices that are low in sodium, saturated fat, trans fat, and cholesterol are recommended to manage  weight.  Stop drug abuse.  Avoid taking birth control pills. ? Talk to your health care provider about the risks of taking birth control pills if you are over 35 years old, smoke, get migraines, or have ever had a blood clot.  Get evaluated for sleep disorders (sleep apnea). ? Talk to your health care provider about getting a sleep evaluation if you snore a lot or have excessive sleepiness.  Take medicines only as directed by your health care provider. ? For some people, aspirin or blood thinners (anticoagulants) are helpful in reducing the risk of forming abnormal blood clots that can lead to stroke. If you have the irregular heart rhythm of atrial fibrillation, you should be on a blood thinner unless there is a good reason you cannot take them. ? Understand all your medicine instructions.  Make sure that other conditions (such as anemia or atherosclerosis) are addressed. Get help right away if:  You have sudden weakness or numbness of the face, arm, or leg, especially on one side of the body.  Your face or eyelid droops to one side.  You have sudden confusion.  You have trouble speaking (aphasia) or understanding.  You have sudden trouble seeing in one or both eyes.  You have sudden trouble walking.  You have dizziness.  You have a loss of balance or coordination.  You have a sudden, severe headache with no known cause.  You have new chest pain or an irregular heartbeat. Any of these symptoms may represent a serious problem that is an emergency. Do not wait to see if the symptoms will go away.   Get medical help at once. Call your local emergency services (911 in [place] Do not drive yourself to the hospital. This information is not intended to replace advice given to you by your health care provider. Make sure you discuss any questions you have with your health care provider. Document Released: 12/16/2004 Document Revised: 04/15/2016 Document Reviewed: 05/11/2013 Elsevier  Interactive Patient Education  2017 Else[employer] Inc[employer]    To measure for knee high compression hose: Measure the length of calf, largest circumference of calf, and ankle circumference first thing in the morning before your legs have a chance to swell.  Take these 3 measurements with you to obtain 20-30 mm mercury graduated knee high compression hose.  Put the stockings on in the morning, remove at bedtime.      Chronic Venous Insufficiency Chronic venous insufficiency, also called venous stasis, is a condition that prevents blood from being pumped effectively through the veins in your legs. Blood may no longer be pumped effectively from the legs back to the heart. This condition can range from mild to severe. With proper treatment, you should be able to continue with an active life. What are the causes? Chronic venous insufficiency occurs when the vein walls become stretched, weakened, or damaged, or when valves within the vein are damaged. Some common causes of this include:  High blood pressure inside the veins (venous hypertension).  Increased blood pressure in the leg veins from long periods of sitting or standing.  A blood clot that blocks blood flow in a vein (deep vein thrombosis, DVT).  Inflammation of a vein (phlebitis) that causes a blood clot to form.  Tumors in the pelvis that cause blood to back up.  What increases the risk? The following factors may make you more likely to develop this condition:  Having a family history of this condition.  Obesity.  Pregnancy.  Living without enough physical activity or exercise (sedentary lifestyle).  Smoking.  Having a job that requires long periods of standing or sitting in one place.  Being a certain age. Women in their 27s and 35s and men in their 62s are more likely to develop this condition.  What are the signs or symptoms? Symptoms of this condition include:  Veins that are enlarged, bulging, or twisted (varicose  veins).  Skin breakdown or ulcers.  Reddened or discolored skin on the front of the leg.  Brown, smooth, tight, and painful skin just above the ankle, usually on the inside of the leg (lipodermatosclerosis).  Swelling.  How is this diagnosed? This condition may be diagnosed based on:  Your medical history.  A physical exam.  Tests, such as: ? A procedure that creates an image of a blood vessel and nearby organs and provides information about blood flow through the blood vessel (duplex ultrasound). ? A procedure that tests blood flow (plethysmography). ? A procedure to look at the veins using X-ray and dye (venogram).  How is this treated? The goals of treatment are to help you return to an active life and to minimize pain or disability. Treatment depends on the severity of your condition, and it may include:  Wearing compression stockings. These can help relieve symptoms and help prevent your condition from getting worse. However, they do not cure the condition.  Sclerotherapy. This is a procedure involving an injection of a material that "dissolves" damaged veins.  Surgery. This may involve: ? Removing a diseased vein (vein stripping). ? Cutting off blood flow through the vein (laser ablation surgery). ?  Repairing a valve.  Follow these instructions at home:  Wear compression stockings as told by your health care provider. These stockings help to prevent blood clots and reduce swelling in your legs.  Take over-the-counter and prescription medicines only as told by your health care provider.  Stay active by exercising, walking, or doing different activities. Ask your health care provider what activities are safe for you and how much exercise you need.  Drink enough fluid to keep your urine clear or pale yellow.  Do not use any products that contain nicotine or tobacco, such as cigarettes and e-cigarettes. If you need help quitting, ask your health care provider.  Keep  all follow-up visits as told by your health care provider. This is important. Contact a health care provider if:  You have redness, swelling, or more pain in the affected area.  You see a red streak or line that extends up or down from the affected area.  You have skin breakdown or a loss of skin in the affected area, even if the breakdown is small.  You get an injury in the affected area. Get help right away if:  You get an injury and an open wound in the affected area.  You have severe pain that does not get better with medicine.  You have sudden numbness or weakness in the foot or ankle below the affected area, or you have trouble moving your foot or ankle.  You have a fever and you have worse or persistent symptoms.  You have chest pain.  You have shortness of breath. Summary  Chronic venous insufficiency, also called venous stasis, is a condition that prevents blood from being pumped effectively through the veins in your legs.  Chronic venous insufficiency occurs when the vein walls become stretched, weakened, or damaged, or when valves within the vein are damaged.  Treatment for this condition depends on how severe your condition is, and it may involve wearing compression stockings or having a procedure.  Make sure you stay active by exercising, walking, or doing different activities. Ask your health care provider what activities are safe for you and how much exercise you need. This information is not intended to replace advice given to you by your health care provider. Make sure you discuss any questions you have with your health care provider. Document Released: 03/14/2007 Document Revised: 09/27/2016 Document Reviewed: 09/27/2016 Elsevier Interactive Patient Education  2017 [employer].

## 2017-06-01 NOTE — Addendum Note (Signed)
Addended by: Lianne Cure A on: 06/01/2017 01:33 PM   Modules accepted: Orders

## 2017-06-30 DIAGNOSIS — I10 Essential (primary) hypertension: Secondary | ICD-10-CM | POA: Diagnosis not present

## 2017-06-30 DIAGNOSIS — E119 Type 2 diabetes mellitus without complications: Secondary | ICD-10-CM | POA: Diagnosis not present

## 2017-06-30 DIAGNOSIS — E782 Mixed hyperlipidemia: Secondary | ICD-10-CM | POA: Diagnosis not present

## 2017-06-30 DIAGNOSIS — Z125 Encounter for screening for malignant neoplasm of prostate: Secondary | ICD-10-CM | POA: Diagnosis not present

## 2017-07-04 DIAGNOSIS — I10 Essential (primary) hypertension: Secondary | ICD-10-CM | POA: Diagnosis not present

## 2017-07-04 DIAGNOSIS — E782 Mixed hyperlipidemia: Secondary | ICD-10-CM | POA: Diagnosis not present

## 2017-07-04 DIAGNOSIS — Z125 Encounter for screening for malignant neoplasm of prostate: Secondary | ICD-10-CM | POA: Diagnosis not present

## 2017-08-21 DIAGNOSIS — Z23 Encounter for immunization: Secondary | ICD-10-CM | POA: Diagnosis not present

## 2017-10-03 DIAGNOSIS — H2513 Age-related nuclear cataract, bilateral: Secondary | ICD-10-CM | POA: Diagnosis not present

## 2017-10-03 DIAGNOSIS — E119 Type 2 diabetes mellitus without complications: Secondary | ICD-10-CM | POA: Diagnosis not present

## 2017-11-02 ENCOUNTER — Other Ambulatory Visit: Payer: Self-pay | Admitting: *Deleted

## 2017-11-02 MED ORDER — LOSARTAN POTASSIUM 100 MG PO TABS: 100.0000 mg | ORAL_TABLET | Freq: Every day | ORAL | 1 refills | Status: DC

## 2017-11-09 ENCOUNTER — Other Ambulatory Visit: Payer: Self-pay

## 2017-11-09 MED ORDER — ATORVASTATIN CALCIUM 20 MG PO TABS: 20.0000 mg | ORAL_TABLET | Freq: Every day | ORAL | 1 refills | Status: DC

## 2017-11-09 NOTE — Telephone Encounter (Signed)
Rx(s) sent to pharmacy electronically.  

## 2017-11-30 ENCOUNTER — Ambulatory Visit: Payer: Medicare Other | Admitting: Family

## 2017-11-30 ENCOUNTER — Encounter (HOSPITAL_COMMUNITY): Payer: Medicare Other

## 2017-12-02 ENCOUNTER — Ambulatory Visit (HOSPITAL_COMMUNITY)
Admission: RE | Admit: 2017-12-02 | Discharge: 2017-12-02 | Disposition: A | Discharge status: Home / Self Care | Payer: Medicare Other | Source: Ambulatory Visit | Attending: Family | Admitting: Family

## 2017-12-02 ENCOUNTER — Encounter: Payer: Self-pay | Admitting: Family

## 2017-12-02 ENCOUNTER — Ambulatory Visit (INDEPENDENT_AMBULATORY_CARE_PROVIDER_SITE_OTHER): Payer: Medicare Other | Admitting: Family

## 2017-12-02 VITALS — BP 177/78 | HR 68 | Temp 97.0°F | Resp 20 | Ht 66.0 in | Wt 186.7 lb

## 2017-12-02 DIAGNOSIS — I6523 Occlusion and stenosis of bilateral carotid arteries: Secondary | ICD-10-CM | POA: Diagnosis present

## 2017-12-02 DIAGNOSIS — Z9889 Other specified postprocedural states: Secondary | ICD-10-CM | POA: Insufficient documentation

## 2017-12-02 DIAGNOSIS — Z87891 Personal history of nicotine dependence: Secondary | ICD-10-CM | POA: Diagnosis not present

## 2017-12-02 LAB — VAS US CAROTID
LCCADSYS: -84 cm/s
LCCAPDIAS: 21 cm/s
LCCAPSYS: 149 cm/s
LEFT ECA DIAS: -13 cm/s
LEFT VERTEBRAL DIAS: 12 cm/s
LICADSYS: -147 cm/s
LICAPDIAS: -31 cm/s
Left CCA dist dias: -20 cm/s
Left ICA dist dias: -41 cm/s
Left ICA prox sys: -123 cm/s
RCCAPDIAS: 12 cm/s
RCCAPSYS: 87 cm/s
RIGHT CCA MID DIAS: 15 cm/s
RIGHT ECA DIAS: -13 cm/s
RIGHT VERTEBRAL DIAS: 21 cm/s
Right cca dist sys: -134 cm/s

## 2017-12-02 NOTE — Progress Notes (Signed)
Chief Complaint: Follow up Extracranial Carotid Artery Stenosis   History of Present Illness  CYLER KAPPES is a 77 y.o. male who is status post left CEA in 2011 by Dr. [name], had 4 vessel CABG the same day, denies having an MI. He returns today for follow up.  The patient denies any history of TIA or stroke symptoms, specifically he denies a history of amaurosis fugax or monocular blindness, unilateralfacial drooping, hemiplegia, or receptive or expressive aphasia. He also denies claudication symptoms with walking.  He states he has slightly elevated triglycerides. He attends cardiac rehab 5days/week, 1.5 hours each session.  He states his his blood pressure usually runs 110's/60's, and that he has white coat syndrome, his blood pressure is elevated now.   At his 11-30-16 visit, he did not have a PCP and had an A1C of 8.0 in April 2017; I referred him to Internal Medicine in [place] for PCP and to help pt manage his DM; pt states his PCP is [name], MD.   Pt Diabetic: Pt states his last A1C was 8.2, pt is not taking any glycemic lowering agents, pt states he thinks his PCP will start him on a medication (s) for DM when he sees him in February 2019.  Pt smoker: former smoker, quit 2011  Pt meds include: Statin : Yes Betablocker: Yes ASA: Yes Other anticoagulants/antiplatelets: no      Past Medical History:  Diagnosis Date  . Anemia   . CAD (coronary artery disease)    12/29/2011 normal R/S nuclear study;08/05/2010 cath smooth 50% ostial left main 60% distal left main, 60% ca+ smooth prox LAD, 70% ramus intermed., 90% prox. near ostial, 80% prox. CX, diffuse 30%prox. RCA, 95% eccentric stenosis mid RCA 5o0-90%distal RCA; 50% distal aortic stenosis  . 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 COPD (chronic obstructive pulmonary disease) ([place])   . H/O discoid lupus erythematosus   . H/O discoid lupus erythematosus   .  Heart murmur    Echo:  Mitral regurg. EF 55-65%  . Hyperlipidemia   . Hypertension   . 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 Early and Hendrickson    Social History Social History   Tobacco Use  . Smoking status: Former Smoker    Packs/day: 1.00    Types: Cigarettes    Last attempt to quit: 08/07/2010    Years since quitting: 7.3  . Smokeless tobacco: Never Used  Substance Use Topics  . Alcohol use: Yes    Comment: 1 glass of wine weekly  . Drug use: No    Family History Family History  Problem Relation Age of Onset  . Hypertension Mother   . Diabetes Mother   . Heart disease Mother        After age 12    Surgical History Past Surgical History:  Procedure Laterality Date  . CAROTID ENDARTERECTOMY  08/07/2010  . CHOLECYSTECTOMY  1991   gall bladder  . CORONARY ARTERY BYPASS GRAFT  08/07/2010  . TONSILLECTOMY  1976  . TOOTH EXTRACTION  Jan. and Aug.  2015    No Known Allergies  Current Outpatient Medications  Medication Sig Dispense Refill  . aspirin EC 81 MG tablet Take 81 mg by mouth daily.      Marland Kitchen atorvastatin (LIPITOR) 20 MG tablet Take 1 tablet (20 mg total) by mouth daily. 90 tablet 1  . hydrochlorothiazide (HYDRODIURIL) 25 MG tablet TAKE 1 TABLET BY MOUTH  EVERY DAY 30 tablet 11  . losartan (COZAAR) 100 MG tablet Take 1 tablet (100 mg total) by mouth daily. 90 tablet 1  . metoprolol tartrate (LOPRESSOR) 50 MG tablet TAKE 1 TABLET BY MOUTH TWICE A DAY 180 tablet 3  . omeprazole (PRILOSEC) 40 MG capsule TAKE 1 CAPSULE BY MOUTH DAILY 90 capsule 3  . levofloxacin (LEVAQUIN) 500 MG tablet TAKE 1 TABLET BY MOUTH EVERY DAY FOR 14 DAYS  0   No current facility-administered medications for this visit.     Review of Systems : See HPI for pertinent positives and negatives.  Physical Examination  Vitals:   12/02/17 1341 12/02/17 1344  BP: (!) 159/66 (!) 177/78  Pulse: 68   Resp: 20   Temp: (!) 97 F (36.1 C)   TempSrc: Oral   SpO2: 98%   Weight: 186 lb 11.2 oz  (84.7 kg)   Height: 5\' 6"  (1.676 m)    Body mass index is 30.13 kg/m.  General: WDWN obese male in NAD GAIT:normal Eyes: PERRLA Pulmonary: Respirations are non labored, CTAB, no rales, rhonchi, or wheezing.  Cardiac: regular rhythm, no detected murmur.  VASCULAR EXAM Carotid Bruits Left Right   Negative Negative   Radial pulses are 2+ palpable and equal. Abdominal aortic pulse is not palpable.       LE Pulses LEFT RIGHT   POPLITEAL not palpable  not palpable   POSTERIOR TIBIAL  notpalpable  not palpable    DORSALIS PEDIS  ANTERIOR TIBIAL palpable  faintly palpable     Gastrointestinal: soft, nontender, BS WNL, no r/g, no palpated masses.  Musculoskeletal: No muscle atrophy/wasting. M/S 5/5 throughout, Extremities without ischemic changes. Trace pitting edema in both ankles. 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   Skin: No rash, no cellulitis, no ulcers noted.   Neurologic: A&O X 3; appropriate affect , sensation is normal, speech is normal, CN 2-12 intact, Pain and light touch intact in extremities, Motor exam as listed above.  Psychiatric: Mood appropriate to clinical situation.      Assessment: DONAVAN KERLIN is a 77 y.o. male who is status post left CEA in 2011, had 4 vessel CABG the same day, denies having an MI. He has no hx of stroke or TIA.  His atherosclerotic risk factors include uncontrolled DM, former smoker, CAD, and COPD.   DATA Carotid Duplex (12/02/17): 60 - 79 % right ICA stenosis  Left ICA: 1-39% stenosis.  Bilateral vertebral artery flow is antegrade.  Right subclavian artery waveforms are triphasic, left are biphasic. Plaque with mildly elevated velocities of 339 cm/s in the right subclavian artery.   No significant change  compared to the exam on 05-30-17.    Plan:  Follow-up in 6 monthswith Carotid Duplex scan.  I discussed in depth with the patient the nature of atherosclerosis, and emphasized the importance of maximal medical management including strict control of blood pressure, blood glucose, and lipid levels, obtaining regular exercise, and continued cessation of smoking.  The patient is aware that without maximal medical management the underlying atherosclerotic disease process will progress, limiting the benefit of any interventions. The patient was given information about stroke prevention and what symptoms should prompt the patient to seek immediate medical care. Thank you for allowing [place] to participate in this patient's care.  Clemon Chambers, RN, MSN, FNP-C [facility] Office: [phone]  Clinic Physician: Chen/Cain  12/02/17 1:54 PM

## 2017-12-02 NOTE — Patient Instructions (Signed)

## 2017-12-05 NOTE — Addendum Note (Signed)
Addended by: Lianne Cure A on: 12/05/2017 02:10 PM   Modules accepted: Orders

## 2018-01-06 ENCOUNTER — Other Ambulatory Visit: Payer: Self-pay | Admitting: *Deleted

## 2018-01-06 MED ORDER — HYDROCHLOROTHIAZIDE 25 MG PO TABS: 25.0000 mg | ORAL_TABLET | Freq: Every day | ORAL | 0 refills | Status: DC

## 2018-03-13 ENCOUNTER — Other Ambulatory Visit: Payer: Self-pay | Admitting: Cardiovascular Disease

## 2018-03-13 NOTE — Telephone Encounter (Signed)
Rx request sent to pharmacy.  

## 2018-03-21 ENCOUNTER — Ambulatory Visit (INDEPENDENT_AMBULATORY_CARE_PROVIDER_SITE_OTHER): Payer: Medicare Other | Admitting: Cardiovascular Disease

## 2018-03-21 ENCOUNTER — Encounter: Payer: Self-pay | Admitting: Cardiovascular Disease

## 2018-03-21 VITALS — BP 167/59 | HR 63 | Ht 66.0 in | Wt 178.0 lb

## 2018-03-21 DIAGNOSIS — Z951 Presence of aortocoronary bypass graft: Secondary | ICD-10-CM | POA: Diagnosis not present

## 2018-03-21 DIAGNOSIS — E7800 Pure hypercholesterolemia, unspecified: Secondary | ICD-10-CM | POA: Diagnosis not present

## 2018-03-21 DIAGNOSIS — I6523 Occlusion and stenosis of bilateral carotid arteries: Secondary | ICD-10-CM

## 2018-03-21 DIAGNOSIS — I10 Essential (primary) hypertension: Secondary | ICD-10-CM

## 2018-03-21 NOTE — Progress Notes (Signed)
03/21/2018 [name]   10-14-1941  [id number]  Primary Physician Garlon Hatchet Jaymes Graff, MD Primary Cardiologist: [name] MD Garret Reddish, [place], [place]  HPI:  Thomas Gray is a 77 y.o.  moderately overweight the first Caucasian male father of 3 sons, grandfather to 5 granddaughters who was formerly a patient of Dr. Terance Ice. I last saw him in the office  03/09/2017.He does not have a primary care physician. His cardiac risk factor profile is significant for 50 pack years of tobacco abuse having quit 4 years ago at the time of his coronary artery bypass grafting. History of hypertension and hyperlipidemia. There is no family history. He has never had a heart attack or stroke. He denies chest pain or shortness of breath. He had coronary artery bypass grafting x4 by Dr. Merilynn Finland in 2011 concomitant left carotid endarterectomy performed by Dr. Curt Jews. A Myoview stress test performed in 2013 was nonischemic. He denies chest pain or shortness of breath. He apparently recently had carotid Dopplers 11/30/16 the digital progression of disease in his right carotid endarterectomy site. Since I saw him a year ago he is remained stable.  Has had mild progression of his carotid artery by duplex ultrasound followed by Dr. [name].  He Specifically denies chest pain or shortness of breath.     Current Meds  Medication Sig  . aspirin EC 81 MG tablet Take 81 mg by mouth daily.    Marland Kitchen atorvastatin (LIPITOR) 20 MG tablet Take 1 tablet (20 mg total) by mouth daily.  . hydrochlorothiazide (HYDRODIURIL) 25 MG tablet Take 1 tablet (25 mg total) by mouth daily. KEEP OV.  Marland Kitchen losartan (COZAAR) 100 MG tablet Take 1 tablet (100 mg total) by mouth daily.  . metoprolol tartrate (LOPRESSOR) 50 MG tablet TAKE 1 TABLET BY MOUTH TWICE A DAY  . omeprazole (PRILOSEC) 40 MG capsule TAKE 1 CAPSULE BY MOUTH DAILY  . [DISCONTINUED] levofloxacin (LEVAQUIN) 500 MG tablet TAKE 1 TABLET BY MOUTH EVERY DAY FOR 14 DAYS     No Known Allergies  Social History   Socioeconomic History  . Marital status: Single    Spouse name: Not on file  . Number of children: Not on file  . Years of education: Not on file  . Highest education level: Not on file  Occupational History  . Not on file  Social Needs  . Financial resource strain: Not on file  . Food insecurity:    Worry: Not on file    Inability: Not on file  . Transportation needs:    Medical: Not on file    Non-medical: Not on file  Tobacco Use  . Smoking status: Former Smoker    Packs/day: 1.00    Types: Cigarettes    Last attempt to quit: 08/07/2010    Years since quitting: 7.6  . Smokeless tobacco: Never Used  Substance and Sexual Activity  . Alcohol use: Yes    Comment: 1 glass of wine weekly  . Drug use: No  . Sexual activity: Not on file  Lifestyle  . Physical activity:    Days per week: Not on file    Minutes per session: Not on file  . Stress: Not on file  Relationships  . Social connections:    Talks on phone: Not on file    Gets together: Not on file    Attends religious service: Not on file    Active member of club or organization: Not on file  Attends meetings of clubs or organizations: Not on file    Relationship status: Not on file  . Intimate partner violence:    Fear of current or ex partner: Not on file    Emotionally abused: Not on file    Physically abused: Not on file    Forced sexual activity: Not on file  Other Topics Concern  . Not on file  Social History Narrative  . Not on file     Review of Systems: General: negative for chills, fever, night sweats or weight changes.  Cardiovascular: negative for chest pain, dyspnea on exertion, edema, orthopnea, palpitations, paroxysmal nocturnal dyspnea or shortness of breath Dermatological: negative for rash Respiratory: negative for cough or wheezing Urologic: negative for hematuria Abdominal: negative for nausea, vomiting, diarrhea, bright red blood per rectum,  melena, or hematemesis Neurologic: negative for visual changes, syncope, or dizziness All other systems reviewed and are otherwise negative except as noted above.    Blood pressure (!) 167/59, pulse 63, height 5\' 6"  (1.676 m), weight 178 lb (80.7 kg).  General appearance: alert and no distress Neck: no adenopathy, no carotid bruit, no JVD, supple, symmetrical, trachea midline and thyroid not enlarged, symmetric, no tenderness/mass/nodules Lungs: clear to auscultation bilaterally Heart: regular rate and rhythm, S1, S2 normal, no murmur, click, rub or gallop Extremities: extremities normal, atraumatic, no cyanosis or edema Pulses: 2+ and symmetric Skin: Skin color, texture, turgor normal. No rashes or lesions Neurologic: Alert and oriented X 3, normal strength and tone. Normal symmetric reflexes. Normal coordination and gait  EKG sinus rhythm at 63 without ST or T wave changes.  I personally reviewed this EKG.  ASSESSMENT AND PLAN:   Occlusion and stenosis of carotid artery History of left carotid endarterectomy in 2011 at the time of his coronary artery bypass grafting by Dr. Curt Jews.  He is followed by duplex ultrasound on a semiannual basis.  His recent studies show moderate progression on the right.  S/P CABG x 4 History of CAD status post coronary artery bypass grafting times 02/2010 by Dr. Merilynn Finland.  He denies chest pain or shortness of breath.  Essential hypertension, benign History of essential hypertension with blood pressure measured today at 167/59.  He is on losartan, hydrochlorothiazide and metoprolol.  He measures his blood pressure at home which is usually much lower than this..  Hyperlipidemia History of hyperlipidemia on statin therapy.      [name] MD FACP,FACC,FAHA, [facility] 03/21/2018 1:45 PM

## 2018-03-21 NOTE — Assessment & Plan Note (Signed)
History of left carotid endarterectomy in 2011 at the time of his coronary artery bypass grafting by Dr. Curt Jews.  He is followed by duplex ultrasound on a semiannual basis.  His recent studies show moderate progression on the right.

## 2018-03-21 NOTE — Assessment & Plan Note (Signed)
History of hyperlipidemia on statin therapy. 

## 2018-03-21 NOTE — Assessment & Plan Note (Signed)
History of CAD status post coronary artery bypass grafting times 02/2010 by Dr. Merilynn Finland.  He denies chest pain or shortness of breath.

## 2018-03-21 NOTE — Assessment & Plan Note (Signed)
History of essential hypertension with blood pressure measured today at 167/59.  He is on losartan, hydrochlorothiazide and metoprolol.  He measures his blood pressure at home which is usually much lower than this.Marland Kitchen

## 2018-03-21 NOTE — Patient Instructions (Signed)

## 2018-03-25 ENCOUNTER — Other Ambulatory Visit: Payer: Self-pay | Admitting: Cardiovascular Disease

## 2018-03-27 NOTE — Telephone Encounter (Signed)
REFILL 

## 2018-04-03 DIAGNOSIS — N1831 Chronic kidney disease, stage 3a: Secondary | ICD-10-CM | POA: Insufficient documentation

## 2018-04-03 DIAGNOSIS — N183 Chronic kidney disease, stage 3 unspecified: Secondary | ICD-10-CM | POA: Insufficient documentation

## 2018-04-05 ENCOUNTER — Other Ambulatory Visit: Payer: Self-pay | Admitting: Cardiovascular Disease

## 2018-04-05 NOTE — Telephone Encounter (Signed)
Rx(s) sent to pharmacy electronically.  

## 2018-05-09 ENCOUNTER — Other Ambulatory Visit: Payer: Self-pay | Admitting: Cardiovascular Disease

## 2018-06-08 ENCOUNTER — Ambulatory Visit (INDEPENDENT_AMBULATORY_CARE_PROVIDER_SITE_OTHER): Payer: Medicare Other | Admitting: Family

## 2018-06-08 ENCOUNTER — Ambulatory Visit (HOSPITAL_COMMUNITY)
Admission: RE | Admit: 2018-06-08 | Discharge: 2018-06-08 | Disposition: A | Discharge status: Home / Self Care | Payer: Medicare Other | Source: Ambulatory Visit | Attending: Vascular Surgery | Admitting: Vascular Surgery

## 2018-06-08 ENCOUNTER — Encounter: Payer: Self-pay | Admitting: Family

## 2018-06-08 ENCOUNTER — Other Ambulatory Visit: Payer: Self-pay

## 2018-06-08 VITALS — BP 179/71 | HR 62 | Resp 20 | Ht 66.0 in | Wt 180.0 lb

## 2018-06-08 DIAGNOSIS — I2510 Atherosclerotic heart disease of native coronary artery without angina pectoris: Secondary | ICD-10-CM | POA: Diagnosis not present

## 2018-06-08 DIAGNOSIS — Z87891 Personal history of nicotine dependence: Secondary | ICD-10-CM

## 2018-06-08 DIAGNOSIS — I6523 Occlusion and stenosis of bilateral carotid arteries: Secondary | ICD-10-CM | POA: Diagnosis present

## 2018-06-08 DIAGNOSIS — Z9889 Other specified postprocedural states: Secondary | ICD-10-CM | POA: Diagnosis not present

## 2018-06-08 NOTE — Patient Instructions (Signed)

## 2018-06-08 NOTE — Progress Notes (Signed)
Chief Complaint: Follow up Extracranial Carotid Artery Stenosis   History of Present Illness  Thomas Gray is a 77 y.o. male who is status post left CEA in 2011 by Dr. [name], had 4 vessel CABG the same day, denies having an MI. He returns today for follow up.  The patient denies any history of TIA or stroke symptoms, specificallyhedenies a history of amaurosis fugax or monocular blindness, unilateralfacial drooping, hemiplegia, or receptive or expressive aphasia. Healso denies claudication symptoms with walking.  He attends cardiac rehab 5days/week, 1.5 hours each session.  He states his his blood pressure usually runs 115-120's/55-70 at cardiac rehab, and that he has white coat syndrome, his blood pressure is elevated now.  At his 11-30-16 visit, he didnot have a PCP and hadan A1C of 8.0 in April 2017; Ireferred himto Internal Medicine in [place] for PCP and to help pt manage his DM; pt states his PCP is [name], [place].   Diabetic: Pt states his last A1C was 6.6, improved from 8.0 since he started metformin.  Tobacco use: former smoker, quit 2011  Pt meds include: Statin : Yes Betablocker: Yes ASA: Yes Other anticoagulants/antiplatelets: no    Past Medical History:  Diagnosis Date  . Anemia   . CAD (coronary artery disease)    12/29/2011 normal R/S nuclear study;08/05/2010 cath smooth 50% ostial left main 60% distal left main, 60% ca+ smooth prox LAD, 70% ramus intermed., 90% prox. near ostial, 80% prox. CX, diffuse 30%prox. RCA, 95% eccentric stenosis mid RCA 5o0-90%distal RCA; 50% distal aortic stenosis  . 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 COPD (chronic obstructive pulmonary disease) ([place])   . H/O discoid lupus erythematosus   . H/O discoid lupus erythematosus   . Heart murmur    Echo:  Mitral regurg. EF 55-65%  . Hyperlipidemia   . Hypertension   . 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 Early and  Hendrickson    Social History Social History   Tobacco Use  . Smoking status: Former Smoker    Packs/day: 1.00    Types: Cigarettes    Last attempt to quit: 08/07/2010    Years since quitting: 7.8  . Smokeless tobacco: Never Used  Substance Use Topics  . Alcohol use: Yes    Comment: 1 glass of wine weekly  . Drug use: No    Family History Family History  Problem Relation Age of Onset  . Hypertension Mother   . Diabetes Mother   . Heart disease Mother        After age 53    Surgical History Past Surgical History:  Procedure Laterality Date  . CAROTID ENDARTERECTOMY  08/07/2010  . CHOLECYSTECTOMY  1991   gall bladder  . CORONARY ARTERY BYPASS GRAFT  08/07/2010  . TONSILLECTOMY  1976  . TOOTH EXTRACTION  Jan. and Aug.  2015    No Known Allergies  Current Outpatient Medications  Medication Sig Dispense Refill  . aspirin EC 81 MG tablet Take 81 mg by mouth daily.      Marland Kitchen atorvastatin (LIPITOR) 20 MG tablet TAKE 1 TABLET BY MOUTH EVERY DAY 90 tablet 3  . hydrochlorothiazide (HYDRODIURIL) 25 MG tablet Take 1 tablet (25 mg total) by mouth daily. KEEP OV. 90 tablet 0  . losartan (COZAAR) 100 MG tablet Take 1 tablet (100 mg total) by mouth daily. 90 tablet 1  . metFORMIN (GLUCOPHAGE) 1000 MG tablet TAKE 1 TABLET WITH AM  AND PM MEAL FOR DIABETES  3  . metoprolol tartrate (LOPRESSOR) 50 MG tablet TAKE 1 TABLET BY MOUTH TWICE A DAY 180 tablet 3  . omeprazole (PRILOSEC) 40 MG capsule TAKE 1 CAPSULE BY MOUTH DAILY 90 capsule 2   No current facility-administered medications for this visit.     Review of Systems : See HPI for pertinent positives and negatives.  Physical Examination  Vitals:   06/08/18 0951 06/08/18 0952  BP: (!) 194/68 (!) 179/71  Pulse: 62   Resp: 20   SpO2: 99%   Weight: 180 lb (81.6 kg)   Height: 5\' 6"  (1.676 m)    Body mass index is 29.05 kg/m.  General: WDWN male in NAD GAIT: normal Eyes: PERRLA HENT: No gross abnormalities.  Pulmonary:   Respirations are non-labored, good air movement in all fields, CTAB, no rales, rhonchi, or wheezing. Cardiac: regular rhythm, no detected murmur.  VASCULAR EXAM Carotid Bruits Right Left   Positive Negative     Abdominal aortic pulse is not palpable. Radial pulses are 2+ palpable and equal.                                                                                                                            LE Pulses Right Left       POPLITEAL  not palpable   not palpable       POSTERIOR TIBIAL  not palpable    Not palpable        DORSALIS PEDIS      ANTERIOR TIBIAL faintly palpable   palpable    Gastrointestinal: soft, nontender, BS WNL, no r/g, no palpated masses. Musculoskeletal: No muscle atrophy/wasting. M/S 5/5 throughout, Extremities without ischemic changes.Trace pitting edema in both ankles. 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 Skin: No rash, no cellulitis, no ulcers noted.  Neurologic: A&O X 3; appropriate affect , sensation is normal, speech is normal, CN 2-12 intact except has some hearing loss, Pain and light touch intact in extremities, Motor exam as listed above. Psychiatric: Mood appropriate to clinical situation.    Assessment: [name] is a 77 y.o. male who is status post left CEA in 2011, had 4 vessel CABG the same day, denies having an MI. He has no hx of stroke or TIA.  His atherosclerotic risk factors include controlled DM, former smoker, CAD, hypertension, and COPD. He states his his blood pressure usually runs 115-120's/55-70 at cardiac rehab, and that he has white coat syndrome, his blood pressure is elevated now.   He exercises in cardiac rehab 5 days/week.  He takes a daily ASA and statin.     DATA Carotid Duplex (06-08-18): 60 - 79 % right ICA stenosis  Left ICA: 1-39% stenosis.  Bilateral vertebral artery flow is antegrade.  Bilateral subclavian artery waveforms are normal.  No significant changecompared to the  exams on 05-30-17 and 12-02-17.   Plan:   Follow-up in 6 monthswith Carotid Duplex scan.  I discussed in depth with the patient the nature of atherosclerosis, and emphasized the importance of maximal medical management including strict control of blood pressure, blood glucose, and lipid levels, obtaining regular exercise, and continued cessation of smoking.  The patient is aware that without maximal medical management the underlying atherosclerotic disease process will progress, limiting the benefit of any interventions. The patient was given information about stroke prevention and what symptoms should prompt the patient to seek immediate medical care. Thank you for allowing [place] to participate in this patient's care.  Clemon Chambers, RN, MSN, FNP-C [facility] Office: [phone]  Clinic Physician: Oneida Alar  06/08/18 9:54 AM

## 2018-06-22 ENCOUNTER — Ambulatory Visit: Payer: Medicare Other | Admitting: Vascular Surgery

## 2018-06-29 ENCOUNTER — Ambulatory Visit: Payer: Medicare Other | Admitting: Vascular Surgery

## 2018-07-03 ENCOUNTER — Other Ambulatory Visit: Payer: Self-pay

## 2018-07-03 DIAGNOSIS — Z9889 Other specified postprocedural states: Secondary | ICD-10-CM

## 2018-07-03 DIAGNOSIS — I6523 Occlusion and stenosis of bilateral carotid arteries: Secondary | ICD-10-CM

## 2018-08-26 ENCOUNTER — Other Ambulatory Visit: Payer: Self-pay | Admitting: Cardiovascular Disease

## 2018-08-28 NOTE — Telephone Encounter (Signed)
Rx request sent to pharmacy.  

## 2018-10-23 ENCOUNTER — Other Ambulatory Visit: Payer: Self-pay | Admitting: Cardiovascular Disease

## 2018-11-20 ENCOUNTER — Other Ambulatory Visit: Payer: Self-pay | Admitting: Cardiovascular Disease

## 2018-11-27 ENCOUNTER — Encounter: Payer: Self-pay | Admitting: Family

## 2018-11-27 ENCOUNTER — Ambulatory Visit (INDEPENDENT_AMBULATORY_CARE_PROVIDER_SITE_OTHER): Payer: Medicare Other | Admitting: Family

## 2018-11-27 ENCOUNTER — Other Ambulatory Visit: Payer: Self-pay

## 2018-11-27 ENCOUNTER — Ambulatory Visit (HOSPITAL_COMMUNITY)
Admission: RE | Admit: 2018-11-27 | Discharge: 2018-11-27 | Disposition: A | Discharge status: Home / Self Care | Payer: Medicare Other | Source: Ambulatory Visit | Attending: Family | Admitting: Family

## 2018-11-27 VITALS — BP 181/72 | HR 64 | Temp 97.0°F | Resp 16 | Ht 67.0 in | Wt 183.0 lb

## 2018-11-27 DIAGNOSIS — Z9889 Other specified postprocedural states: Secondary | ICD-10-CM

## 2018-11-27 DIAGNOSIS — I6523 Occlusion and stenosis of bilateral carotid arteries: Secondary | ICD-10-CM

## 2018-11-27 DIAGNOSIS — Z87891 Personal history of nicotine dependence: Secondary | ICD-10-CM | POA: Diagnosis not present

## 2018-11-27 NOTE — Patient Instructions (Signed)

## 2018-11-27 NOTE — Progress Notes (Signed)
Chief Complaint: Follow up Extracranial Carotid Artery Stenosis   History of Present Illness  Thomas Gray is a 78 y.o. male who is status post left CEA in 2011by Dr. [name], had 4 vessel CABG the same day, denies having an MI. He returns today for follow up.  The patient denies any history of TIA or stroke symptoms, specificallyhedenies a history of amaurosis fugax or monocular blindness, unilateralfacial drooping, hemiplegia, or receptive or expressive aphasia. Healso denies claudication symptoms with walking.  He states that he has white coat syndrome, his blood pressure is elevated now.  At his 11-30-16 visit, he didnot have a PCP and hadan A1C of 8.0 in April 2017; Ireferred himto Internal Medicine in [place] for PCP and to help pt manage his DM; pt states his PCP is [name], MD.   Diabetic:Pt states his last A1C was 7.1, improved from 8.0 since he started metformin.  Tobacco use: former smoker, quit 2011  Pt meds include: Statin : Yes Betablocker: Yes ASA: Yes Other anticoagulants/antiplatelets: no   Past Medical History:  Diagnosis Date  . Anemia   . CAD (coronary artery disease)    12/29/2011 normal R/S nuclear study;08/05/2010 cath smooth 50% ostial left main 60% distal left main, 60% ca+ smooth prox LAD, 70% ramus intermed., 90% prox. near ostial, 80% prox. CX, diffuse 30%prox. RCA, 95% eccentric stenosis mid RCA 5o0-90%distal RCA; 50% distal aortic stenosis  . 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 COPD (chronic obstructive pulmonary disease) ([place])   . H/O discoid lupus erythematosus   . H/O discoid lupus erythematosus   . Heart murmur    Echo:  Mitral regurg. EF 55-65%  . Hyperlipidemia   . Hypertension   . 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 Early and Hendrickson    Social History Social History   Tobacco Use  . Smoking status: Former Smoker    Packs/day: 1.00    Types: Cigarettes   Last attempt to quit: 08/07/2010    Years since quitting: 8.3  . Smokeless tobacco: Never Used  Substance Use Topics  . Alcohol use: Yes    Comment: 1 glass of wine weekly  . Drug use: No    Family History Family History  Problem Relation Age of Onset  . Hypertension Mother   . Diabetes Mother   . Heart disease Mother        After age 35    Surgical History Past Surgical History:  Procedure Laterality Date  . CAROTID ENDARTERECTOMY  08/07/2010  . CHOLECYSTECTOMY  1991   gall bladder  . CORONARY ARTERY BYPASS GRAFT  08/07/2010  . TONSILLECTOMY  1976  . TOOTH EXTRACTION  Jan. and Aug.  2015    No Known Allergies  Current Outpatient Medications  Medication Sig Dispense Refill  . aspirin EC 81 MG tablet Take 81 mg by mouth daily.      Marland Kitchen atorvastatin (LIPITOR) 20 MG tablet TAKE 1 TABLET BY MOUTH EVERY DAY 90 tablet 3  . hydrochlorothiazide (HYDRODIURIL) 25 MG tablet TAKE 1 TABLET (25 MG TOTAL) BY MOUTH DAILY. KEEP OV. 90 tablet 1  . losartan (COZAAR) 100 MG tablet TAKE 1 TABLET BY MOUTH EVERY DAY 90 tablet 2  . metFORMIN (GLUCOPHAGE) 1000 MG tablet 500 mg 2 (two) times daily with a meal.   3  . metoprolol tartrate (LOPRESSOR) 50 MG tablet TAKE 1 TABLET BY MOUTH TWICE A DAY 180 tablet 3  .  omeprazole (PRILOSEC) 40 MG capsule TAKE 1 CAPSULE BY MOUTH DAILY 90 capsule 2   No current facility-administered medications for this visit.     Review of Systems : See HPI for pertinent positives and negatives.  Physical Examination  Vitals:   11/27/18 1200 11/27/18 1204 11/27/18 1205  BP: (!) 198/68 (!) 163/71 (!) 181/72  Pulse: 60 64 64  Resp: 16    Temp: (!) 97 F (36.1 C)    TempSrc: Oral    SpO2: 99%    Weight: 183 lb (83 kg)    Height: 5\' 7"  (1.702 m)     Body mass index is 28.66 kg/m.  General: WDWN male in NAD GAIT: normal Eyes: PERRLA HENT: No gross abnormalities.  Pulmonary:  Respirations are non-labored, good air movement in all fields, CTAB, no rales, rhonchi,  or wheezes.  Cardiac:  Regular rhythm, no detected murmur.  VASCULAR EXAM Carotid Bruits Right Left   Positive Negative     Abdominal aortic pulse is not palpable. Radial pulses are 2+ palpable and equal.                                                                                                                            LE Pulses Right Left       POPLITEAL  not palpable   not palpable       POSTERIOR TIBIAL  2+ palpable   1+ palpable        DORSALIS PEDIS      ANTERIOR TIBIAL 1+ palpable  not palpable     Gastrointestinal: soft, nontender, BS WNL, no r/g, no palpable masses. Musculoskeletal: no muscle atrophy/wasting. M/S 5/5 throughout, extremities without ischemic changes. Skin: No rashes, no ulcers, no cellulitis.   Neurologic:  A&O X 3; appropriate affect, sensation is normal; speech is normal, CN 2-12 intact, pain and light touch intact in extremities, motor exam as listed above. Psychiatric: Normal thought content, mood appropriate to clinical situation.    Assessment: Thomas Gray is a 78 y.o. male who is status post left CEA in 2011, had 4 vessel CABG the same day, denies having an MI. He has no hx of stroke or TIA.  His atherosclerotic risk factors include controlled DM, former smoker, CAD, hypertension, and COPD.  He takes a daily ASA and statin.     DATA CarotidDuplex(11-27-18): 60 - 79 % right ICA stenosis Left ICA: 1-39% stenosis. Right vertebral artery demonstrates antegrade flow. Left vertebral artery demonstrates high resistant flow. Left subclavian artery was stenotic. Normal flow hemodynamics were seen in the right subclavian artery.  No significant changein the ICA stenoses compared to the exams on7-9-18,12-02-17, and 06-08-18. But vertebral and subclavian hemodynamics have changed compared to the exam on 06-08-18.    Plan:  Follow-up in 6 monthswith Carotid Duplex scan.   I discussed in depth with the patient the nature of  atherosclerosis, and emphasized the importance of maximal medical management including strict control of  blood pressure, blood glucose, and lipid levels, obtaining regular exercise, and continued cessation of smoking.  The patient is aware that without maximal medical management the underlying atherosclerotic disease process will progress, limiting the benefit of any interventions. The patient was given information about stroke prevention and what symptoms should prompt the patient to seek immediate medical care. Thank you for allowing [place] to participate in this patient's care.  Clemon Chambers, RN, MSN, FNP-C [facility] Office: [facility] Physician: Trula Slade  11/27/18 1:25 PM

## 2018-12-27 ENCOUNTER — Other Ambulatory Visit: Payer: Self-pay | Admitting: Cardiovascular Disease

## 2019-02-15 ENCOUNTER — Other Ambulatory Visit: Payer: Self-pay | Admitting: *Deleted

## 2019-02-15 MED ORDER — LOSARTAN POTASSIUM 100 MG PO TABS: 100.0000 mg | ORAL_TABLET | Freq: Every day | ORAL | 2 refills | Status: DC

## 2019-02-21 ENCOUNTER — Telehealth: Payer: Self-pay | Admitting: Cardiovascular Disease

## 2019-02-21 MED ORDER — OLMESARTAN MEDOXOMIL 20 MG PO TABS: 20.0000 mg | ORAL_TABLET | Freq: Every day | ORAL | 6 refills | Status: DC

## 2019-02-21 NOTE — Telephone Encounter (Signed)
Pt calling stating that his Pharmacy [facility] stating that his medication Losartan is on backorder, unavailable and the pharmacy wanted to know if Dr. Gwenlyn Found would like to send in an alternative, so pt would be able to get medication. Please address

## 2019-02-21 NOTE — Telephone Encounter (Signed)
Please refer to Cyril Mourning to answer the question.

## 2019-02-21 NOTE — Telephone Encounter (Signed)
Spoke with [facility] in [place].  They cannot get losartan anymore, don't have a timeline for when it might be back.  PharmD stated that they are mostly able to get candesartan and olmesartan.    Will have patient switch to olmesartan 20 mg.  I realize this is not the "equivalent' dose to losartan 100 mg, but it is more potent and I don't want patient to become hypotensive.  He cannot check BP at home, but does at a nearby gym "well[facility] center".  He hasn't been in 2 weeks, so no idea what home BP reading are running.  Although his Epic BP readings appear high, I don't want to overwhelm him to start.   Patient also aware that if medication is cost prohibitive he is to have pharmacy contact us for another option.

## 2019-03-20 ENCOUNTER — Telehealth: Payer: Self-pay | Admitting: Cardiovascular Disease

## 2019-03-20 NOTE — Telephone Encounter (Signed)
smartphone-[phone]/ consent/my chart pre reg completed

## 2019-03-21 ENCOUNTER — Telehealth: Payer: Self-pay

## 2019-03-21 ENCOUNTER — Other Ambulatory Visit: Payer: Self-pay | Admitting: Cardiovascular Disease

## 2019-03-21 ENCOUNTER — Ambulatory Visit: Payer: Medicare Other | Admitting: Cardiovascular Disease

## 2019-03-21 ENCOUNTER — Telehealth (INDEPENDENT_AMBULATORY_CARE_PROVIDER_SITE_OTHER): Payer: Medicare Other | Admitting: Cardiovascular Disease

## 2019-03-21 DIAGNOSIS — I10 Essential (primary) hypertension: Secondary | ICD-10-CM | POA: Diagnosis not present

## 2019-03-21 DIAGNOSIS — I2510 Atherosclerotic heart disease of native coronary artery without angina pectoris: Secondary | ICD-10-CM | POA: Diagnosis not present

## 2019-03-21 DIAGNOSIS — Z951 Presence of aortocoronary bypass graft: Secondary | ICD-10-CM

## 2019-03-21 DIAGNOSIS — E782 Mixed hyperlipidemia: Secondary | ICD-10-CM | POA: Diagnosis not present

## 2019-03-21 NOTE — Telephone Encounter (Signed)
Omeprazole refilled.

## 2019-03-21 NOTE — Progress Notes (Signed)
Virtual Visit via Video Note   This visit type was conducted due to national recommendations for restrictions regarding the COVID-19 Pandemic (e.g. social distancing) in an effort to limit this patient's exposure and mitigate transmission in our community.  Due to his co-morbid illnesses, this patient is at least at moderate risk for complications without adequate follow up.  This format is felt to be most appropriate for this patient at this time.  All issues noted in this document were discussed and addressed.  A limited physical exam was performed with this format.  Please refer to the patient's chart for his consent to telehealth for [facility].   Evaluation Performed:  Follow-up visit  Date:  03/21/2019   ID:  Thomas, Gray 1941-07-26, MRN [id number]  Patient Location: Home Provider Location: Home  PCP:  Algis Greenhouse, MD  Cardiologist: Dr. Quay Burow Electrophysiologist:  None   Chief Complaint: 1 year follow-up CAD, hypertension, hyperlipidemia  History of Present Illness:    Thomas Gray is a 78 y.o.  moderately overweight the first Caucasian male father of 3 sons, grandfather to 84 granddaughters who was formerly a patient of Dr. Terance Ice. I last saw him in the office  03/21/2018.  His primary care physician is Dr. [name] in [place].Marland Kitchen His cardiac risk factor profile is significant for 50 pack years of tobacco abuse having quit 4 years ago at the time of his coronary artery bypass grafting. History of hypertension and hyperlipidemia. There is no family history. He has never had a heart attack or stroke. He denies chest pain or shortness of breath. He had coronary artery bypass grafting x4 by Dr. Merilynn Finland in 2011 concomitant left carotid endarterectomy performed by Dr. Curt Jews. A Myoview stress test performed in 2013 was nonischemic. He denies chest pain or shortness of breath.He apparently recently had carotid Dopplers 11/27/2018 that showed a  widely patent left endarterectomy site with at least moderate to moderately severe right ICA stenosis representing some mild progression. Since I saw him a year ago he is remained stable.  He exercises at [facility] wellness program at least 5 days a week without symptoms.  His normal blood pressures there are 130/60 with a pulse of 65. Has had mild progression of his carotid artery by duplex ultrasound followed by Dr. .[name].  He Specifically denies chest pain or shortness of breath.  The patient does not have symptoms concerning for COVID-19 infection (fever, chills, cough, or new shortness of breath).    Past Medical History:  Diagnosis Date   Anemia    CAD (coronary artery disease)    12/29/2011 normal R/S nuclear study;08/05/2010 cath smooth 50% ostial left main 60% distal left main, 60% ca+ smooth prox LAD, 70% ramus intermed., 90% prox. near ostial, 80% prox. CX, diffuse 30%prox. 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 Early and Roxan Hockey   Past Surgical History:  Procedure Laterality Date   CAROTID ENDARTERECTOMY  08/07/2010   CHOLECYSTECTOMY  1991   gall bladder   CORONARY ARTERY BYPASS GRAFT  08/07/2010   TONSILLECTOMY  1976   TOOTH EXTRACTION  Jan. and Aug.  2015     Current Meds  Medication Sig   aspirin EC 81 MG tablet Take 81 mg by mouth daily.     atorvastatin (LIPITOR) 20 MG tablet TAKE 1 TABLET BY MOUTH EVERY DAY   hydrochlorothiazide (HYDRODIURIL) 25 MG tablet TAKE 1 TABLET (25 MG TOTAL) BY MOUTH DAILY. KEEP OV.   metFORMIN (GLUCOPHAGE) 1000 MG tablet 500 mg 2 (two) times  daily with a meal.    metoprolol tartrate (LOPRESSOR) 50 MG tablet TAKE 1 TABLET BY MOUTH TWICE A DAY   olmesartan (BENICAR) 20 MG tablet Take 1 tablet (20 mg total) by mouth daily.   [DISCONTINUED] omeprazole (PRILOSEC) 40 MG capsule TAKE 1 CAPSULE BY MOUTH DAILY     Allergies:   Patient has no known allergies.   Social History   Tobacco Use   Smoking status: Former Smoker    Packs/day: 1.00    Types: Cigarettes    Last attempt to quit: 08/07/2010    Years since quitting: 8.6   Smokeless tobacco: Never Used  Substance Use Topics   Alcohol use: Yes    Comment: 1 glass of wine weekly   Drug use: No     Family Hx: The patient's family history includes Diabetes in his mother; Heart disease in his mother; Hypertension in his mother.  ROS:   Please see the history of present illness.     All other systems reviewed and are negative.   Prior CV studies:   The following studies were reviewed today:  Carotid Doppler studies performed 11/27/2018  Labs/Other Tests and Data Reviewed:    EKG:  No ECG reviewed.  Recent Labs: No results found for requested labs within last 8760 hours.   Recent Lipid Panel No results found for: CHOL, TRIG, HDL, CHOLHDL, LDLCALC, LDLDIRECT  Wt Readings from Last 3 Encounters:  03/21/19 180 lb (81.6 kg)  11/27/18 183 lb (83 kg)  06/08/18 180 lb (81.6 kg)     Objective:    Vital Signs:  Ht 5\' 6"  (1.676 m)    Wt 180 lb (81.6 kg)    BMI 29.05 kg/m    VITAL SIGNS:  reviewed GEN:  no acute distress RESPIRATORY:  normal respiratory effort, symmetric expansion NEURO:  alert and oriented x 3, no obvious focal deficit PSYCH:  normal affect  ASSESSMENT & PLAN:    1. Coronary artery disease- history of CAD status post CABG x4 by Dr. Roxan Hockey 07/28/2010 after cardiac catheterization performed by Dr. Claiborne Billings revealed three-vessel disease.  His last Myoview performed in 2013 was nonischemic.  He exercises 5 days a week at [facility] at the  wellness program and has no symptoms. 2. Essential hypertension- patient essentially potential blood pressures measured in his wellness program averaging 130/60 with pulse 65.  He is on metoprolol, hydrochlorothiazide and Benicar. 3. Hyperlipidemia- on atorvastatin with recent lipid profile performed 01/05/2019 revealing a total cholesterol of 127 with an HDL 34 and 9 HDL of 93 4. Carotid artery disease- history of left carotid endarterectomy performed concomitantly at the time of his bypass surgery by Dr. [name] with recent Doppler study performed 11/27/2018 revealing widely patent endarterectomy site with moderately severe right ICA stenosis followed by Dr. [name].  COVID-19 Education: The signs and symptoms of COVID-19 were discussed with the patient and how to seek care for testing (follow up with PCP or arrange E-visit).  The importance of social distancing was  discussed today.  Time:   Today, I have spent 10 minutes with the patient with telehealth technology discussing the above problems.     Medication Adjustments/Labs and Tests Ordered: Current medicines are reviewed at length with the patient today.  Concerns regarding medicines are outlined above.   Tests Ordered: No orders of the defined types were placed in this encounter.   Medication Changes: No orders of the defined types were placed in this encounter.   Disposition:  Follow up in 1 year(s)  Signed, Quay Burow, MD  03/21/2019 9:28 AM    [facility]

## 2019-03-21 NOTE — Telephone Encounter (Signed)
Patient and/or DPR-approved person aware of AVS instructions and verbalized understanding. Letter including After Visit Summary and any other necessary documents to be mailed to the patient's address on file.  

## 2019-03-21 NOTE — Patient Instructions (Signed)

## 2019-04-30 ENCOUNTER — Other Ambulatory Visit: Payer: Self-pay | Admitting: Cardiovascular Disease

## 2019-05-13 ENCOUNTER — Other Ambulatory Visit: Payer: Self-pay | Admitting: Cardiovascular Disease

## 2019-05-18 ENCOUNTER — Other Ambulatory Visit: Payer: Self-pay | Admitting: Cardiovascular Disease

## 2019-05-22 ENCOUNTER — Other Ambulatory Visit: Payer: Self-pay

## 2019-05-22 DIAGNOSIS — I6523 Occlusion and stenosis of bilateral carotid arteries: Secondary | ICD-10-CM

## 2019-05-24 ENCOUNTER — Telehealth (HOSPITAL_COMMUNITY): Payer: Self-pay | Admitting: Rehabilitation

## 2019-05-24 NOTE — Telephone Encounter (Signed)

## 2019-05-28 ENCOUNTER — Ambulatory Visit (HOSPITAL_COMMUNITY)
Admission: RE | Admit: 2019-05-28 | Discharge: 2019-05-28 | Disposition: A | Discharge status: Home / Self Care | Payer: Medicare Other | Source: Ambulatory Visit | Attending: Family | Admitting: Family

## 2019-05-28 ENCOUNTER — Ambulatory Visit (INDEPENDENT_AMBULATORY_CARE_PROVIDER_SITE_OTHER): Payer: Medicare Other | Admitting: Family

## 2019-05-28 ENCOUNTER — Other Ambulatory Visit: Payer: Self-pay

## 2019-05-28 ENCOUNTER — Telehealth: Payer: Self-pay | Admitting: Vascular Surgery

## 2019-05-28 ENCOUNTER — Encounter: Payer: Self-pay | Admitting: Family

## 2019-05-28 VITALS — BP 171/61 | HR 64 | Temp 98.3°F | Resp 16 | Ht 66.5 in | Wt 183.0 lb

## 2019-05-28 DIAGNOSIS — I6523 Occlusion and stenosis of bilateral carotid arteries: Secondary | ICD-10-CM

## 2019-05-28 DIAGNOSIS — Z951 Presence of aortocoronary bypass graft: Secondary | ICD-10-CM

## 2019-05-28 DIAGNOSIS — I2510 Atherosclerotic heart disease of native coronary artery without angina pectoris: Secondary | ICD-10-CM | POA: Diagnosis not present

## 2019-05-28 DIAGNOSIS — Z9889 Other specified postprocedural states: Secondary | ICD-10-CM

## 2019-05-28 DIAGNOSIS — Z87891 Personal history of nicotine dependence: Secondary | ICD-10-CM | POA: Diagnosis not present

## 2019-05-28 DIAGNOSIS — Z01810 Encounter for preprocedural cardiovascular examination: Secondary | ICD-10-CM

## 2019-05-28 NOTE — Progress Notes (Signed)
Chief Complaint: Follow up Extracranial Carotid Artery Stenosis   History of Present Illness  Thomas Gray is a 78 y.o. male who is status post left CEA in 2011by Dr. [name], had 4 vessel CABG the same day, denies having an MI. He returns today for follow up.  The patient denies any history of TIA or stroke symptoms, specificallyhedenies a history of amaurosis fugax or monocular blindness, unilateralfacial drooping, hemiplegia, or receptive or expressive aphasia. Healso denies claudication symptoms with walking.  He states that he has white coat syndrome, his blood pressure is elevated now.  At his 11-30-16 visit, he didnot have a PCP and hadan A1C of 8.0 in April 2017; Ireferred himto Internal Medicine in [place] for PCP and to help pt manage his DM; pt states his PCP is [name], MD.   Diabetic:Pt states his last A1C was7.1, improved from 8.0 since he started metformin.  Tobacco use: former smoker, quit 2011  Pt meds include: Statin : Yes Betablocker: Yes ASA: Yes Other anticoagulants/antiplatelets: no   Past Medical History:  Diagnosis Date  . Anemia   . CAD (coronary artery disease)    12/29/2011 normal R/S nuclear study;08/05/2010 cath smooth 50% ostial left main 60% distal left main, 60% ca+ smooth prox LAD, 70% ramus intermed., 90% prox. near ostial, 80% prox. CX, diffuse 30%prox. RCA, 95% eccentric stenosis mid RCA 5o0-90%distal RCA; 50% distal aortic stenosis  . 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 COPD (chronic obstructive pulmonary disease) ([place])   . H/O discoid lupus erythematosus   . H/O discoid lupus erythematosus   . Heart murmur    Echo:  Mitral regurg. EF 55-65%  . Hyperlipidemia   . Hypertension   . 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 Early and Hendrickson    Social History Social History   Tobacco Use  . Smoking status: Former Smoker    Packs/day: 1.00    Types: Cigarettes   Quit date: 08/07/2010    Years since quitting: 8.8  . Smokeless tobacco: Never Used  Substance Use Topics  . Alcohol use: Yes    Comment: 1 glass of wine weekly  . Drug use: No    Family History Family History  Problem Relation Age of Onset  . Hypertension Mother   . Diabetes Mother   . Heart disease Mother        After age 57    Surgical History Past Surgical History:  Procedure Laterality Date  . CAROTID ENDARTERECTOMY  08/07/2010  . CHOLECYSTECTOMY  1991   gall bladder  . CORONARY ARTERY BYPASS GRAFT  08/07/2010  . TONSILLECTOMY  1976  . TOOTH EXTRACTION  Jan. and Aug.  2015    No Known Allergies  Current Outpatient Medications  Medication Sig Dispense Refill  . aspirin EC 81 MG tablet Take 81 mg by mouth daily.      Marland Kitchen atorvastatin (LIPITOR) 20 MG tablet TAKE 1 TABLET BY MOUTH EVERY DAY 90 tablet 3  . hydrochlorothiazide (HYDRODIURIL) 25 MG tablet TAKE 1 TABLET (25 MG TOTAL) BY MOUTH DAILY. KEEP OV. 90 tablet 1  . metFORMIN (GLUCOPHAGE) 1000 MG tablet 500 mg 2 (two) times daily with a meal.   3  . metFORMIN (GLUCOPHAGE) 1000 MG tablet Take by mouth.    . metoprolol tartrate (LOPRESSOR) 50 MG tablet TAKE 1 TABLET BY MOUTH TWICE A DAY 180 tablet 3  . olmesartan (BENICAR) 20 MG tablet Take 1 tablet (  20 mg total) by mouth daily. 30 tablet 6  . omeprazole (PRILOSEC) 40 MG capsule TAKE 1 CAPSULE BY MOUTH EVERY DAY 90 capsule 0   No current facility-administered medications for this visit.     Review of Systems : See HPI for pertinent positives and negatives.  Physical Examination  Vitals:   05/28/19 1146 05/28/19 1152  BP: (!) 163/70 (!) 171/61  Pulse: 64 64  Resp: 16   Temp: 98.3 F (36.8 C)   TempSrc: Temporal   SpO2: 99%   Weight: 183 lb (83 kg)   Height: 5' 6.5" (1.689 m)    Body mass index is 29.09 kg/m.  General: WDWN male in NAD GAIT: normal Eyes: PERRLA HENT: No gross abnormalities.  Pulmonary:  Respirations are non-labored, good air movement in  all fields, CTAB, no  Rales,  rhonchi, or wheezing. Cardiac: regular rhythm, no detected murmur.  VASCULAR EXAM Carotid Bruits Right Left   Positive Negative     Abdominal aortic pulse is not palpable. Radial pulses are 2+ palpable and equal.                                                                                                                            LE Pulses Right Left       POPLITEAL  not palpable   not palpable       POSTERIOR TIBIAL   palpable   palpable        DORSALIS PEDIS      ANTERIOR TIBIAL  palpable  not palpable     Gastrointestinal: soft, nontender, BS WNL, no r/g, no palpable masses. Musculoskeletal: no muscle atrophy/wasting. M/S 5/5 throughout, extremities without ischemic changes. 1-2+ bilateral pretibial pitting edema. Several spider veins and medium sized varicosities.    Skin: No rashes, no ulcers, no cellulitis.   Neurologic:  A&O X 3; appropriate affect, sensation is normal; speech is normal, CN 2-12 intact, pain and light touch intact in extremities, motor exam as listed above. Psychiatric: Normal thought content, mood appropriate to clinical situation.   DATA CarotidDuplex(05-28-19):  Right Carotid: Velocities in the right ICA are consistent with a 80-99% stenosis. Left Carotid: Velocities in the left ICA are consistent with a 1-39% stenosis. Vertebrals:  Bilateral vertebral arteries demonstrate antegrade flow. Subclavians: Normal flow hemodynamics were seen in bilateral subclavian arteries.  Assessment: Thomas Gray is a 78 y.o. male who is status post left CEA in 2011, had 4 vessel CABG the same day, denies having an MI. He has no hx of stroke or TIA.  His atherosclerotic risk factors includecontrolled DM,former smoker, CAD, hypertension,and COPD.  He takes a daily ASA and statin.  He has an asymptomatic 80-99% right ICA stenosis, increased from 60-79% in January 2020.  He wants to speak with his PCP and Dr. Gwenlyn Found before he  schedules the right CEA. We discussed that his stroke risk today from his right extracranial carotid artery stenosis is about 5%, and the  stroke risk from the CEA surgery is 1-2%. We also discussed that his stroke risk increases by 5% yearly without the CEA surgery. Will schedule pt to see Dr. [name] to discuss this also.   Plan: Follow-up with Dr. [name] ASAP to discuss right CEA, since pt is not ready to have this done.   I discussed in depth with the patient the nature of atherosclerosis, and emphasized the importance of maximal medical management including strict control of blood pressure, blood glucose, and lipid levels, obtaining regular exercise, and continued cessation of smoking.  The patient is aware that without maximal medical management the underlying atherosclerotic disease process will progress, limiting the benefit of any interventions. The patient was given information about stroke prevention and what symptoms should prompt the patient to seek immediate medical care. Thank you for allowing [place] to participate in this patient's care.  Clemon Chambers, RN, MSN, FNP-C [facility] [place] Office: [facility] Physician: Trula Slade  05/28/19 12:26 PM

## 2019-05-28 NOTE — Telephone Encounter (Signed)
Thomas Gray spoke to pt regarding  CEA and wanted ASAP appt with TFE. Marland Kitchen Pt does not want to schedule  CEA until he speaks with his PCP and with his cardiologist. Michela Pitcher he will call us back whenever he can get those appts.

## 2019-05-28 NOTE — Telephone Encounter (Signed)
Message sent to MD

## 2019-05-28 NOTE — Patient Instructions (Signed)

## 2019-05-31 ENCOUNTER — Encounter: Payer: Self-pay | Admitting: Cardiovascular Disease

## 2019-05-31 NOTE — Telephone Encounter (Signed)
lexiscan ordered, message sent to scheduler

## 2019-05-31 NOTE — Telephone Encounter (Signed)
RE: Non-Urgent Medical Question  [name], MD  Sent: Tue May 29, 2019 4:59 PM  To: Fidel Levy, RN      Message  Needs Lexiscan prior to CEA for risk stratification since he's 10 yrs S/P CABG.    JJB

## 2019-06-12 ENCOUNTER — Other Ambulatory Visit: Payer: Self-pay | Admitting: Cardiovascular Disease

## 2019-06-13 ENCOUNTER — Telehealth (HOSPITAL_COMMUNITY): Payer: Self-pay

## 2019-06-13 NOTE — Telephone Encounter (Signed)
Encounter complete. 

## 2019-06-15 ENCOUNTER — Ambulatory Visit (HOSPITAL_COMMUNITY)
Admission: RE | Admit: 2019-06-15 | Discharge: 2019-06-15 | Disposition: A | Discharge status: Home / Self Care | Payer: Medicare Other | Source: Ambulatory Visit | Attending: Internal Medicine | Admitting: Internal Medicine

## 2019-06-15 ENCOUNTER — Other Ambulatory Visit: Payer: Self-pay

## 2019-06-15 DIAGNOSIS — Z951 Presence of aortocoronary bypass graft: Secondary | ICD-10-CM | POA: Insufficient documentation

## 2019-06-15 DIAGNOSIS — I2510 Atherosclerotic heart disease of native coronary artery without angina pectoris: Secondary | ICD-10-CM

## 2019-06-15 DIAGNOSIS — Z01810 Encounter for preprocedural cardiovascular examination: Secondary | ICD-10-CM | POA: Diagnosis not present

## 2019-06-15 LAB — MYOCARDIAL PERFUSION IMAGING
LV dias vol: 80 mL (ref 62–150)
LV sys vol: 36 mL
Peak HR: 104 {beats}/min
Rest HR: 60 {beats}/min
SDS: 1
SRS: 0
SSS: 1
TID: 1.13

## 2019-06-15 MED ORDER — TECHNETIUM TC 99M TETROFOSMIN IV KIT
10.2000 | PACK | Freq: Once | INTRAVENOUS | Status: AC | PRN
  Administered 2019-06-15: 10.2 via INTRAVENOUS
  Filled 2019-06-15: qty 11

## 2019-06-15 MED ORDER — REGADENOSON 0.4 MG/5ML IV SOLN
0.4000 mg | Freq: Once | INTRAVENOUS | Status: AC
  Administered 2019-06-15: 0.4 mg via INTRAVENOUS

## 2019-06-15 MED ORDER — TECHNETIUM TC 99M TETROFOSMIN IV KIT
28.9000 | PACK | Freq: Once | INTRAVENOUS | Status: AC | PRN
  Administered 2019-06-15: 28.9 via INTRAVENOUS
  Filled 2019-06-15: qty 29

## 2019-07-02 ENCOUNTER — Telehealth (HOSPITAL_COMMUNITY): Payer: Self-pay | Admitting: Rehabilitation

## 2019-07-02 NOTE — Telephone Encounter (Signed)

## 2019-07-03 ENCOUNTER — Encounter: Payer: Self-pay | Admitting: Vascular Surgery

## 2019-07-03 ENCOUNTER — Encounter: Payer: Self-pay | Admitting: *Deleted

## 2019-07-03 ENCOUNTER — Other Ambulatory Visit: Payer: Self-pay

## 2019-07-03 ENCOUNTER — Ambulatory Visit (INDEPENDENT_AMBULATORY_CARE_PROVIDER_SITE_OTHER): Payer: Medicare Other | Admitting: Vascular Surgery

## 2019-07-03 VITALS — BP 179/76 | HR 75 | Temp 97.8°F | Resp 20 | Ht 66.0 in | Wt 184.8 lb

## 2019-07-03 DIAGNOSIS — I2510 Atherosclerotic heart disease of native coronary artery without angina pectoris: Secondary | ICD-10-CM | POA: Diagnosis not present

## 2019-07-03 DIAGNOSIS — I6523 Occlusion and stenosis of bilateral carotid arteries: Secondary | ICD-10-CM

## 2019-07-03 NOTE — Progress Notes (Signed)
[facility] [place]  Patient name: Thomas Gray MRN: [id number] DOB: 1941/10/02 Sex: male  REASON FOR VISIT: Discuss critical right carotid stenosis  HPI: Thomas Gray is a 78 y.o. male well-known to me from prior left carotid endarterectomy.  He underwent combined left carotid endarterectomy by myself and coronary artery bypass grafting by Dr. Roxan Hockey in 2011.  He has done well following the surgery.  He has had no neurologic deficits.  He has been under duplex surveillance since that time and has had progression of his disease.  He underwent recent repeat carotid duplex showing critical right internal carotid artery stenosis.  His systolic velocities are 591 cm/s and end-diastolic velocities 638 cm/s.  He is here today for further discussion  Past Medical History:  Diagnosis Date  . Anemia   . CAD (coronary artery disease)    12/29/2011 normal R/S nuclear study;08/05/2010 cath smooth 50% ostial left main 60% distal left main, 60% ca+ smooth prox LAD, 70% ramus intermed., 90% prox. near ostial, 80% prox. CX, diffuse 30%prox. RCA, 95% eccentric stenosis mid RCA 5o0-90%distal RCA; 50% distal aortic stenosis  . 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 COPD (chronic obstructive pulmonary disease) ([place])   . H/O discoid lupus erythematosus   . H/O discoid lupus erythematosus   . Heart murmur    Echo:  Mitral regurg. EF 55-65%  . Hyperlipidemia   . Hypertension   . 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 Nichole Neyer and Hendrickson    Family History  Problem Relation Age of Onset  . Hypertension Mother   . Diabetes Mother   . Heart disease Mother        After age 71    SOCIAL HISTORY: Social History   Tobacco Use  . Smoking status: Former Smoker    Packs/day: 1.00    Types: Cigarettes    Quit date: 08/07/2010    Years since quitting: 8.9  . Smokeless tobacco: Never Used  Substance Use  Topics  . Alcohol use: Yes    Comment: 1 glass of wine weekly    No Known Allergies  Current Outpatient Medications  Medication Sig Dispense Refill  . aspirin EC 81 MG tablet Take 81 mg by mouth daily.      Marland Kitchen atorvastatin (LIPITOR) 20 MG tablet TAKE 1 TABLET BY MOUTH EVERY DAY 90 tablet 3  . hydrochlorothiazide (HYDRODIURIL) 25 MG tablet TAKE 1 TABLET (25 MG TOTAL) BY MOUTH DAILY. KEEP OV. 90 tablet 1  . metFORMIN (GLUCOPHAGE) 1000 MG tablet 500 mg 2 (two) times daily with a meal.   3  . metFORMIN (GLUCOPHAGE) 1000 MG tablet Take by mouth.    . metoprolol tartrate (LOPRESSOR) 50 MG tablet TAKE 1 TABLET BY MOUTH TWICE A DAY 180 tablet 3  . olmesartan (BENICAR) 20 MG tablet Take 1 tablet (20 mg total) by mouth daily. 30 tablet 6  . omeprazole (PRILOSEC) 40 MG capsule TAKE 1 CAPSULE BY MOUTH EVERY DAY 90 capsule 0   No current facility-administered medications for this visit.     REVIEW OF SYSTEMS:  [X]  denotes positive finding, [ ]  denotes negative finding Cardiac  Comments:  Chest pain or chest pressure:    Shortness of breath upon exertion:    Short of breath when lying flat:    Irregular heart rhythm:        Vascular    Pain in calf, thigh, or hip  brought on by ambulation:    Pain in feet at night that wakes you up from your sleep:     Blood clot in your veins:    Leg swelling:           PHYSICAL EXAM: Vitals:   07/03/19 1415 07/03/19 1418  BP: (!) 196/68 (!) 179/76  Pulse: 75   Resp: 20   Temp: 97.8 F (36.6 C)   SpO2: 99%   Weight: 83.8 kg   Height: 5\' 6"  (1.676 m)     GENERAL: The patient is a well-nourished male, in no acute distress. The vital signs are documented above. CARDIOVASCULAR: Well-healed left carotid incision.  No carotid bruits bilaterally.  2+ radial pulses bilaterally PULMONARY: There is good air exchange  MUSCULOSKELETAL: There are no major deformities or cyanosis. NEUROLOGIC: No focal weakness or paresthesias are detected. SKIN: There are  no ulcers or rashes noted. PSYCHIATRIC: The patient has a normal affect.  DATA:  Carotid duplex from 05/28/2019 reveal critical right carotid stenosis.  No left carotid stenosis  Lexiscan Myoview on 06/15/2019 showed no evidence of reversible ischemia  MEDICAL ISSUES: Had a long discussion with the patient.  I explained that he has had progression to critical level of stenosis in his right internal carotid artery.  I have recommended endarterectomy for reduction of stroke risk.  I explained the procedure including 1 to 2% risk of stroke with surgery.  Also the slight risk of cranial nerve injury.  Patient understands and wished to proceed with surgery.  We will schedule this at his earliest [place]. Hannelore Bova, MD [facility] Tel [phone] Pager [phone]

## 2019-07-04 ENCOUNTER — Other Ambulatory Visit: Payer: Self-pay | Admitting: *Deleted

## 2019-07-27 NOTE — Pre-Procedure Instructions (Signed)
[id number] [address], [place] [address] Phone: [phone] Fax: [phone]      Your procedure is scheduled on  08-03-19 Friday from 0730-1002AM  Report to [facility] "A" at 0530 A.M., and check in at the Admitting office.  Call this number if you have problems the morning of surgery:  [phone]  Call [phone] if you have any questions prior to your surgery date Monday-Friday 8am-4pm    Remember:  Do not eat or drink after midnight the night before your surgery  Take these medicines the morning of surgery with A SIP OF WATER : atorvastatin (LIPITOR) metoprolol tartrate (LOPRESSOR) omeprazole (PRILOSEC)  Follow your surgeon's instructions on when to stop Aspirin.  If no instructions were given by your surgeon then you will need to call the office to get those instructions.    7 days prior to surgery STOP taking any Aspirin (unless otherwise instructed by your surgeon), Aleve, Naproxen, Ibuprofen, Motrin, Advil, Goody's, BC's, all herbal medications, fish oil, and all vitamins.   WHAT DO I DO ABOUT MY DIABETES MEDICATION?   Marland Kitchen Do not take oral diabetes medicines (pills)including METFORMIN  the morning of surgery.   How to Manage Your Diabetes Before and After Surgery  Why is it important to control my blood sugar before and after surgery? . Improving blood sugar levels before and after surgery helps healing and can limit problems. . A way of improving blood sugar control is eating a healthy diet by: o  Eating less sugar and carbohydrates o  Increasing activity/exercise o  Talking with your doctor about reaching your blood sugar goals . High blood sugars (greater than 180 mg/dL) can raise your risk of infections and slow your recovery, so you will need to focus on controlling your diabetes during the weeks before surgery. . Make sure that the doctor who takes care of your diabetes knows about  your planned surgery including the date and location.  How do I manage my blood sugar before surgery? . Check your blood sugar at least 4 times a day, starting 2 days before surgery, to make sure that the level is not too high or low. o Check your blood sugar the morning of your surgery when you wake up and every 2 hours until you get to the Short Stay unit. . If your blood sugar is less than 70 mg/dL, you will need to treat for low blood sugar: o Do not take insulin. o Treat a low blood sugar (less than 70 mg/dL) with  cup of clear juice (cranberry or apple), 4 glucose tablets, OR glucose gel. o Recheck blood sugar in 15 minutes after treatment (to make sure it is greater than 70 mg/dL). If your blood sugar is not greater than 70 mg/dL on recheck, call [phone] for further instructions. . Report your blood sugar to the short stay nurse when you get to Short Stay.  . If you are admitted to the hospital after surgery: o Your blood sugar will be checked by the staff and you will probably be given insulin after surgery (instead of oral diabetes medicines) to make sure you have good blood sugar levels. o The goal for blood sugar control after surgery is 80-180 mg/dL.    The Morning of Surgery  Do not wear jewelry, make-up or nail polish.  Do not wear lotions, powders, or perfumes/colognes, or deodorant  Do not shave 48 hours prior to surgery.  Men may shave face and neck.  Do not bring valuables to the hospital.  [facility] is not responsible for any belongings or valuables.  If you are a smoker, DO NOT Smoke 24 hours prior to surgery IF you wear a CPAP at night please bring your mask, tubing, and machine the morning of surgery   Remember that you must have someone to transport you home after your surgery, and remain with you for 24 hours if you are discharged the same day.   Contacts, glasses, hearing aids, dentures or bridgework may not be worn into surgery.    Leave your suitcase  in the car.  After surgery it may be brought to your room.  For patients admitted to the hospital, discharge time will be determined by your treatment team.  Patients discharged the day of surgery will not be allowed to drive home.    Special instructions:   [facility]- Preparing For Surgery  Before surgery, you can play an important role. Because skin is not sterile, your skin needs to be as free of germs as possible. You can reduce the number of germs on your skin by washing with CHG (chlorahexidine gluconate) Soap before surgery.  CHG is an antiseptic cleaner which kills germs and bonds with the skin to continue killing germs even after washing.    Oral Hygiene is also important to reduce your risk of infection.  Remember - BRUSH YOUR TEETH THE MORNING OF SURGERY WITH YOUR REGULAR TOOTHPASTE  Please do not use if you have an allergy to CHG or antibacterial soaps. If your skin becomes reddened/irritated stop using the CHG.  Do not shave (including legs and underarms) for at least 48 hours prior to first CHG shower. It is OK to shave your face.  Please follow these instructions carefully.   1. Shower the NIGHT BEFORE SURGERY and the MORNING OF SURGERY with CHG Soap.   2. If you chose to wash your hair, wash your hair first as usual with your normal shampoo.  3. After you shampoo, rinse your hair and body thoroughly to remove the shampoo.  4. Use CHG as you would any other liquid soap. You can apply CHG directly to the skin and wash gently with a scrungie or a clean washcloth.   5. Apply the CHG Soap to your body ONLY FROM THE NECK DOWN.  Do not use on open wounds or open sores. Avoid contact with your eyes, ears, mouth and genitals (private parts). Wash Face and genitals (private parts)  with your normal soap.   6. Wash thoroughly, paying special attention to the area where your surgery will be performed.  7. Thoroughly rinse your body with warm water from the neck down.  8. DO NOT  shower/wash with your normal soap after using and rinsing off the CHG Soap.  9. Pat yourself dry with a CLEAN TOWEL.  10. Wear CLEAN PAJAMAS to bed the night before surgery, wear comfortable clothes the morning of surgery  11. Place CLEAN SHEETS on your bed the night of your first shower and DO NOT SLEEP WITH PETS.  Day of Surgery:  Do not apply any deodorants/lotions. Please shower the morning of surgery with the CHG soap  Please wear clean clothes to the hospital/surgery center.   Remember to brush your teeth WITH YOUR REGULAR TOOTHPASTE.   Please read over the fact sheets that you were given.

## 2019-07-31 ENCOUNTER — Encounter (HOSPITAL_COMMUNITY): Payer: Self-pay

## 2019-07-31 ENCOUNTER — Other Ambulatory Visit (HOSPITAL_COMMUNITY)
Admission: RE | Admit: 2019-07-31 | Discharge: 2019-07-31 | Disposition: A | Discharge status: Home / Self Care | Payer: Medicare Other | Source: Ambulatory Visit | Attending: Vascular Surgery | Admitting: Vascular Surgery

## 2019-07-31 ENCOUNTER — Other Ambulatory Visit: Payer: Self-pay

## 2019-07-31 ENCOUNTER — Encounter (HOSPITAL_COMMUNITY)
Admission: RE | Admit: 2019-07-31 | Discharge: 2019-07-31 | Disposition: A | Discharge status: Home / Self Care | Payer: Medicare Other | Source: Ambulatory Visit | Attending: Vascular Surgery | Admitting: Vascular Surgery

## 2019-07-31 DIAGNOSIS — Z01818 Encounter for other preprocedural examination: Secondary | ICD-10-CM | POA: Insufficient documentation

## 2019-07-31 DIAGNOSIS — Z20828 Contact with and (suspected) exposure to other viral communicable diseases: Secondary | ICD-10-CM | POA: Insufficient documentation

## 2019-07-31 HISTORY — DX: Type 2 diabetes mellitus without complications: E11.9

## 2019-07-31 LAB — COMPREHENSIVE METABOLIC PANEL
ALT: 44 U/L (ref 0–44)
AST: 62 U/L — ABNORMAL HIGH (ref 15–41)
Albumin: 4.1 g/dL (ref 3.5–5.0)
Alkaline Phosphatase: 43 U/L (ref 38–126)
Anion gap: 10 (ref 5–15)
BUN: 22 mg/dL (ref 8–23)
CO2: 24 mmol/L (ref 22–32)
Calcium: 9.2 mg/dL (ref 8.9–10.3)
Chloride: 104 mmol/L (ref 98–111)
Creatinine, Ser: 1.59 mg/dL — ABNORMAL HIGH (ref 0.61–1.24)
GFR calc Af Amer: 47 mL/min — ABNORMAL LOW (ref >60)
GFR calc non Af Amer: 41 mL/min — ABNORMAL LOW (ref >60)
Glucose, Bld: 184 mg/dL — ABNORMAL HIGH (ref 70–99)
Potassium: 4.9 mmol/L (ref 3.5–5.1)
Sodium: 138 mmol/L (ref 135–145)
Total Bilirubin: 1.1 mg/dL (ref 0.3–1.2)
Total Protein: 7.1 g/dL (ref 6.5–8.1)

## 2019-07-31 LAB — URINALYSIS, ROUTINE W REFLEX MICROSCOPIC
Bacteria, UA: NONE SEEN
Bilirubin Urine: NEGATIVE
Glucose, UA: 150 mg/dL — AB
Hgb urine dipstick: NEGATIVE
Ketones, ur: NEGATIVE mg/dL
Leukocytes,Ua: NEGATIVE
Nitrite: NEGATIVE
Protein, ur: 30 mg/dL — AB
Specific Gravity, Urine: 1.019 (ref 1.005–1.030)
pH: 6 (ref 5.0–8.0)

## 2019-07-31 LAB — APTT: aPTT: 29 seconds (ref 24–36)

## 2019-07-31 LAB — GLUCOSE, CAPILLARY: Glucose-Capillary: 224 mg/dL — ABNORMAL HIGH (ref 70–99)

## 2019-07-31 LAB — CBC
HCT: 35.1 % — ABNORMAL LOW (ref 39.0–52.0)
Hemoglobin: 11.6 g/dL — ABNORMAL LOW (ref 13.0–17.0)
MCH: 32 pg (ref 26.0–34.0)
MCHC: 33 g/dL (ref 30.0–36.0)
MCV: 97 fL (ref 80.0–100.0)
Platelets: 234 10*3/uL (ref 150–400)
RBC: 3.62 MIL/uL — ABNORMAL LOW (ref 4.22–5.81)
RDW: 19.8 % — ABNORMAL HIGH (ref 11.5–15.5)
WBC: 7.7 10*3/uL (ref 4.0–10.5)
nRBC: 0 % (ref 0.0–0.2)

## 2019-07-31 LAB — PROTIME-INR
INR: 1.1 (ref 0.8–1.2)
Prothrombin Time: 14.2 seconds (ref 11.4–15.2)

## 2019-07-31 LAB — SURGICAL PCR SCREEN
MRSA, PCR: NEGATIVE
Staphylococcus aureus: NEGATIVE

## 2019-07-31 LAB — TYPE AND SCREEN
ABO/RH(D): A NEG
Antibody Screen: NEGATIVE

## 2019-07-31 NOTE — Progress Notes (Addendum)
  Coronavirus Screening Scheduled for COVID test today Have you experienced the following symptoms:  Cough yes/no: No Fever (>100.56F)  yes/no: No Runny nose yes/no: No Sore throat yes/no: No Difficulty breathing/shortness of breath  yes/no: No Loss of smell or taste:No Have you or a family member traveled in the last 14 days and where? yes/no: No  PCP - Dr [name]  Cardiologist - Dr Gwenlyn Found, [facility]  Chest x-ray - NA  EKG - today  Stress Test - 06-15-19  ECHO -12-18-12   Cardiac Cath - 08-05-2010  AICD-denies PM-denies LOOP-denies  Sleep Study - no CPAP - denies  LABS-PCR, CBC,CMP,PT-INR,APTT,T/S,A1C Message sent to Dr. Regarding abnormal labs.(UA,AST)  ASA-81mg . Per patient, advised by Dr to continue taking Aspirin.  ERAS-NA  HA1C-Today Fasting Blood Sugar - 224  Lo-100's   Hi-220 Checks Blood Sugar 3-4 times a month  Anesthesia-Y. Cardiac history.  Pt denies having palpitations, dizziness,leg swelling,chest pain, sob, or fever at this time. All instructions explained to the pt, with a verbal understanding of the material. Pt agrees to go over the instructions while at home for a better understanding. Pt also instructed to self quarantine after being tested for COVID-19. The opportunity to ask questions was provided.

## 2019-07-31 NOTE — Progress Notes (Signed)
   07/31/19 1118  OBSTRUCTIVE SLEEP APNEA  Have you ever been diagnosed with sleep apnea through a sleep study? No  Do you snore loudly (loud enough to be heard through closed doors)?  1  Do you often feel tired, fatigued, or sleepy during the daytime (such as falling asleep during driving or talking to someone)? 0  Has anyone observed you stop breathing during your sleep? 0  Do you have, or are you being treated for high blood pressure? 1  BMI more than 35 kg/m2? 0  Age > 50 (1-yes) 1  Neck circumference greater than:Male 16 inches or larger, Male 17inches or larger? 1  Male Gender (Yes=1) 1  Obstructive Sleep Apnea Score 5  Score 5 or greater  Results sent to PCP

## 2019-08-01 LAB — HEMOGLOBIN A1C
Hgb A1c MFr Bld: 8.4 % — ABNORMAL HIGH (ref 4.8–5.6)
Mean Plasma Glucose: 194 mg/dL

## 2019-08-01 LAB — NOVEL CORONAVIRUS, NAA (HOSP ORDER, SEND-OUT TO REF LAB; TAT 18-24 HRS): SARS-CoV-2, NAA: NOT DETECTED

## 2019-08-01 NOTE — Anesthesia Preprocedure Evaluation (Addendum)
Anesthesia Evaluation  Patient identified by MRN, date of birth, ID band Patient awake    Reviewed: Allergy & Precautions, NPO status , Patient's Chart, lab work & pertinent test results  Airway Mallampati: III  TM Distance: >3 FB Neck ROM: Limited    Dental  (+) Dental Advisory Given   Pulmonary COPD, former smoker,    breath sounds clear to auscultation       Cardiovascular hypertension, Pt. on medications and Pt. on home beta blockers + CAD and + CABG   Rhythm:Regular Rate:Normal  05/2019 nuclear stress test:  No reversible ischemia. LVEF 55% with normal wall motion. This is a low risk study.   Neuro/Psych negative neurological ROS     GI/Hepatic negative GI ROS, Neg liver ROS,   Endo/Other  diabetes, Type 2, Oral Hypoglycemic Agents  Renal/GU CRFRenal disease     Musculoskeletal   Abdominal   Peds  Hematology  (+) anemia ,   Anesthesia Other Findings   Reproductive/Obstetrics                           Lab Results  Component Value Date   WBC 7.7 07/31/2019   HGB 11.6 (L) 07/31/2019   HCT 35.1 (L) 07/31/2019   MCV 97.0 07/31/2019   PLT 234 07/31/2019   Lab Results  Component Value Date   CREATININE 1.59 (H) 07/31/2019   BUN 22 07/31/2019   NA 138 07/31/2019   K 4.9 07/31/2019   CL 104 07/31/2019   CO2 24 07/31/2019    Anesthesia Physical Anesthesia Plan  ASA: III  Anesthesia Plan: General   Post-op Pain Management:    Induction: Intravenous  PONV Risk Score and Plan: 2 and Dexamethasone, Ondansetron and Treatment may vary due to age or medical condition  Airway Management Planned: Oral ETT and Video Laryngoscope Planned  Additional Equipment: Arterial line  Intra-op Plan:   Post-operative Plan:   Informed Consent: I have reviewed the patients History and Physical, chart, labs and discussed the procedure including the risks, benefits and alternatives for the  proposed anesthesia with the patient or authorized representative who has indicated his/her understanding and acceptance.     Dental advisory given  Plan Discussed with: CRNA  Anesthesia Plan Comments: (History of CAD status post CABG x4 2011.  His last Myoview performed in 2013 was nonischemic.  He exercises regularly and has no symptoms. History of left carotid endarterectomy performed concomitantly at the time of his bypass surgery by Dr. [name] with recent Doppler study performed 05/28/2019 revealing widely patent endarterectomy site with 80-99% right ICA stenosis followed by Dr. [name]. Last seen by cardiologist Dr. Gwenlyn Found 03/21/19, doing well at that time, no changes in management, advised to f/u in 1 year. He did order a preop nuclear stress which was low risk, nonischemic.   CKD III followed by PCP. Per records in care everywhere baseline creatinine ~1.3-1.4.  Nuclear stress 06/15/19:  Nuclear stress EF: 55%.  The left ventricular ejection fraction is normal (55-65%).  No T wave inversion was noted during stress.  There was no ST segment deviation noted during stress.  This is a low risk study.   No reversible ischemia. LVEF 55% with normal wall motion. This is a low risk study.     )      Anesthesia Quick Evaluation

## 2019-08-03 ENCOUNTER — Inpatient Hospital Stay (HOSPITAL_COMMUNITY): Payer: Medicare Other | Admitting: Physician Assistant

## 2019-08-03 ENCOUNTER — Other Ambulatory Visit: Payer: Self-pay

## 2019-08-03 ENCOUNTER — Inpatient Hospital Stay (HOSPITAL_COMMUNITY): Payer: Medicare Other

## 2019-08-03 ENCOUNTER — Encounter (HOSPITAL_COMMUNITY): Payer: Self-pay | Admitting: *Deleted

## 2019-08-03 ENCOUNTER — Encounter (HOSPITAL_COMMUNITY)
Admission: RE | Disposition: A | Discharge status: Home / Self Care | Payer: Self-pay | Source: Ambulatory Visit | Attending: Vascular Surgery

## 2019-08-03 ENCOUNTER — Inpatient Hospital Stay (HOSPITAL_COMMUNITY)
Admission: RE | Admit: 2019-08-03 | Discharge: 2019-08-04 | DRG: 039 | Disposition: A | Discharge status: Home Health | Payer: Medicare Other | Source: Ambulatory Visit | Attending: Vascular Surgery | Admitting: Vascular Surgery

## 2019-08-03 DIAGNOSIS — I2510 Atherosclerotic heart disease of native coronary artery without angina pectoris: Secondary | ICD-10-CM | POA: Diagnosis present

## 2019-08-03 DIAGNOSIS — D649 Anemia, unspecified: Secondary | ICD-10-CM | POA: Diagnosis present

## 2019-08-03 DIAGNOSIS — E119 Type 2 diabetes mellitus without complications: Secondary | ICD-10-CM | POA: Diagnosis present

## 2019-08-03 DIAGNOSIS — Z7984 Long term (current) use of oral hypoglycemic drugs: Secondary | ICD-10-CM | POA: Diagnosis not present

## 2019-08-03 DIAGNOSIS — E785 Hyperlipidemia, unspecified: Secondary | ICD-10-CM | POA: Diagnosis present

## 2019-08-03 DIAGNOSIS — Z87891 Personal history of nicotine dependence: Secondary | ICD-10-CM

## 2019-08-03 DIAGNOSIS — Z7982 Long term (current) use of aspirin: Secondary | ICD-10-CM

## 2019-08-03 DIAGNOSIS — Z79899 Other long term (current) drug therapy: Secondary | ICD-10-CM | POA: Diagnosis not present

## 2019-08-03 DIAGNOSIS — Z20828 Contact with and (suspected) exposure to other viral communicable diseases: Secondary | ICD-10-CM | POA: Diagnosis present

## 2019-08-03 DIAGNOSIS — Z951 Presence of aortocoronary bypass graft: Secondary | ICD-10-CM | POA: Diagnosis not present

## 2019-08-03 DIAGNOSIS — L930 Discoid lupus erythematosus: Secondary | ICD-10-CM | POA: Diagnosis present

## 2019-08-03 DIAGNOSIS — I080 Rheumatic disorders of both mitral and aortic valves: Secondary | ICD-10-CM | POA: Diagnosis present

## 2019-08-03 DIAGNOSIS — I10 Essential (primary) hypertension: Secondary | ICD-10-CM | POA: Diagnosis present

## 2019-08-03 DIAGNOSIS — Z8249 Family history of ischemic heart disease and other diseases of the circulatory system: Secondary | ICD-10-CM

## 2019-08-03 DIAGNOSIS — I6521 Occlusion and stenosis of right carotid artery: Secondary | ICD-10-CM | POA: Diagnosis not present

## 2019-08-03 DIAGNOSIS — J449 Chronic obstructive pulmonary disease, unspecified: Secondary | ICD-10-CM | POA: Diagnosis present

## 2019-08-03 DIAGNOSIS — Z833 Family history of diabetes mellitus: Secondary | ICD-10-CM

## 2019-08-03 DIAGNOSIS — I6523 Occlusion and stenosis of bilateral carotid arteries: Secondary | ICD-10-CM | POA: Diagnosis present

## 2019-08-03 DIAGNOSIS — I6522 Occlusion and stenosis of left carotid artery: Secondary | ICD-10-CM | POA: Diagnosis present

## 2019-08-03 HISTORY — PX: PATCH ANGIOPLASTY: SHX6230

## 2019-08-03 HISTORY — PX: ENDARTERECTOMY: SHX5162

## 2019-08-03 LAB — URINALYSIS, ROUTINE W REFLEX MICROSCOPIC
Bacteria, UA: NONE SEEN
Bilirubin Urine: NEGATIVE
Glucose, UA: NEGATIVE mg/dL
Ketones, ur: NEGATIVE mg/dL
Leukocytes,Ua: NEGATIVE
Nitrite: NEGATIVE
Protein, ur: 100 mg/dL — AB
Specific Gravity, Urine: 1.018 (ref 1.005–1.030)
pH: 5 (ref 5.0–8.0)

## 2019-08-03 LAB — GLUCOSE, CAPILLARY
Glucose-Capillary: 164 mg/dL — ABNORMAL HIGH (ref 70–99)
Glucose-Capillary: 148 mg/dL — ABNORMAL HIGH (ref 70–99)
Glucose-Capillary: 236 mg/dL — ABNORMAL HIGH (ref 70–99)
Glucose-Capillary: 234 mg/dL — ABNORMAL HIGH (ref 70–99)

## 2019-08-03 SURGERY — ENDARTERECTOMY, CAROTID
Anesthesia: General | Site: Neck | Laterality: Right

## 2019-08-03 MED ORDER — ALUM & MAG HYDROXIDE-SIMETH 200-200-20 MG/5ML PO SUSP: 15.0000 mL | ORAL | Status: DC | PRN

## 2019-08-03 MED ORDER — PROTAMINE SULFATE 10 MG/ML IV SOLN
INTRAVENOUS | Status: DC | PRN
  Administered 2019-08-03 (×2): 50 mg via INTRAVENOUS

## 2019-08-03 MED ORDER — GUAIFENESIN-DM 100-10 MG/5ML PO SYRP: 15.0000 mL | ORAL_SOLUTION | ORAL | Status: DC | PRN

## 2019-08-03 MED ORDER — RISAQUAD PO CAPS
1.0000 | ORAL_CAPSULE | Freq: Every day | ORAL | Status: DC
  Administered 2019-08-03 – 2019-08-04 (×2): 1 via ORAL
  Filled 2019-08-03 (×2): qty 1

## 2019-08-03 MED ORDER — DEXAMETHASONE SODIUM PHOSPHATE 10 MG/ML IJ SOLN
INTRAMUSCULAR | Status: DC | PRN
  Administered 2019-08-03: 4 mg via INTRAVENOUS

## 2019-08-03 MED ORDER — SODIUM CHLORIDE 0.9 % IV SOLN
INTRAVENOUS | Status: DC
  Administered 2019-08-03: 15:00:00 via INTRAVENOUS

## 2019-08-03 MED ORDER — POTASSIUM CHLORIDE CRYS ER 20 MEQ PO TBCR: 20.0000 meq | EXTENDED_RELEASE_TABLET | Freq: Every day | ORAL | Status: DC | PRN

## 2019-08-03 MED ORDER — ROCURONIUM BROMIDE 100 MG/10ML IV SOLN
INTRAVENOUS | Status: DC | PRN
  Administered 2019-08-03: 60 mg via INTRAVENOUS

## 2019-08-03 MED ORDER — MAGNESIUM SULFATE 2 GM/50ML IV SOLN: 2.0000 g | Freq: Every day | INTRAVENOUS | Status: DC | PRN

## 2019-08-03 MED ORDER — LACTATED RINGERS IV SOLN
INTRAVENOUS | Status: DC | PRN
  Administered 2019-08-03: 07:00:00 via INTRAVENOUS

## 2019-08-03 MED ORDER — ACETAMINOPHEN 325 MG PO TABS: 325.0000 mg | ORAL_TABLET | ORAL | Status: DC | PRN

## 2019-08-03 MED ORDER — PANTOPRAZOLE SODIUM 40 MG PO TBEC
40.0000 mg | DELAYED_RELEASE_TABLET | Freq: Every day | ORAL | Status: DC
  Administered 2019-08-04: 40 mg via ORAL
  Filled 2019-08-03: qty 1

## 2019-08-03 MED ORDER — METOPROLOL TARTRATE 50 MG PO TABS
50.0000 mg | ORAL_TABLET | Freq: Two times a day (BID) | ORAL | Status: DC
  Administered 2019-08-04: 50 mg via ORAL
  Filled 2019-08-03: qty 1

## 2019-08-03 MED ORDER — SODIUM CHLORIDE 0.9 % IV SOLN
INTRAVENOUS | Status: DC | PRN
  Administered 2019-08-03: 25 ug/min via INTRAVENOUS

## 2019-08-03 MED ORDER — HYDROCHLOROTHIAZIDE 25 MG PO TABS
25.0000 mg | ORAL_TABLET | Freq: Every day | ORAL | Status: DC
  Administered 2019-08-04: 25 mg via ORAL
  Filled 2019-08-03: qty 1

## 2019-08-03 MED ORDER — EPHEDRINE SULFATE 50 MG/ML IJ SOLN
INTRAMUSCULAR | Status: DC | PRN
  Administered 2019-08-03: 5 mg via INTRAVENOUS

## 2019-08-03 MED ORDER — OXYCODONE-ACETAMINOPHEN 5-325 MG PO TABS
1.0000 | ORAL_TABLET | ORAL | Status: DC | PRN
  Administered 2019-08-03: 1 via ORAL
  Administered 2019-08-04: 2 via ORAL
  Administered 2019-08-04: 1 via ORAL
  Filled 2019-08-03 (×2): qty 1
  Filled 2019-08-03: qty 2

## 2019-08-03 MED ORDER — PROMETHAZINE HCL 25 MG/ML IJ SOLN: 6.2500 mg | INTRAMUSCULAR | Status: DC | PRN

## 2019-08-03 MED ORDER — PROPOFOL 10 MG/ML IV BOLUS
INTRAVENOUS | Status: AC
  Filled 2019-08-03: qty 20

## 2019-08-03 MED ORDER — IRBESARTAN 150 MG PO TABS
150.0000 mg | ORAL_TABLET | Freq: Every day | ORAL | Status: DC
  Administered 2019-08-03 – 2019-08-04 (×2): 150 mg via ORAL
  Filled 2019-08-03 (×2): qty 1

## 2019-08-03 MED ORDER — BISACODYL 10 MG RE SUPP: 10.0000 mg | Freq: Every day | RECTAL | Status: DC | PRN

## 2019-08-03 MED ORDER — FENTANYL CITRATE (PF) 100 MCG/2ML IJ SOLN
INTRAMUSCULAR | Status: DC | PRN
  Administered 2019-08-03 (×2): 50 ug via INTRAVENOUS

## 2019-08-03 MED ORDER — LABETALOL HCL 5 MG/ML IV SOLN
10.0000 mg | INTRAVENOUS | Status: DC | PRN
  Administered 2019-08-04: 03:00:00 10 mg via INTRAVENOUS
  Filled 2019-08-03: qty 4

## 2019-08-03 MED ORDER — FENTANYL CITRATE (PF) 100 MCG/2ML IJ SOLN
INTRAMUSCULAR | Status: AC
  Filled 2019-08-03: qty 2

## 2019-08-03 MED ORDER — HYDRALAZINE HCL 20 MG/ML IJ SOLN
5.0000 mg | INTRAMUSCULAR | Status: DC | PRN
  Administered 2019-08-03: 5 mg via INTRAVENOUS

## 2019-08-03 MED ORDER — FENTANYL CITRATE (PF) 100 MCG/2ML IJ SOLN
25.0000 ug | INTRAMUSCULAR | Status: DC | PRN
  Administered 2019-08-03 (×2): 25 ug via INTRAVENOUS

## 2019-08-03 MED ORDER — 0.9 % SODIUM CHLORIDE (POUR BTL) OPTIME
TOPICAL | Status: DC | PRN
  Administered 2019-08-03: 2000 mL

## 2019-08-03 MED ORDER — LIDOCAINE 2% (20 MG/ML) 5 ML SYRINGE
INTRAMUSCULAR | Status: DC | PRN
  Administered 2019-08-03: 50 mg via INTRAVENOUS

## 2019-08-03 MED ORDER — MORPHINE SULFATE (PF) 2 MG/ML IV SOLN: 2.0000 mg | INTRAVENOUS | Status: DC | PRN

## 2019-08-03 MED ORDER — SODIUM CHLORIDE 0.9 % IV SOLN
INTRAVENOUS | Status: AC
  Filled 2019-08-03: qty 1.2

## 2019-08-03 MED ORDER — HYDRALAZINE HCL 20 MG/ML IJ SOLN
INTRAMUSCULAR | Status: AC
  Filled 2019-08-03: qty 1

## 2019-08-03 MED ORDER — ATORVASTATIN CALCIUM 10 MG PO TABS
20.0000 mg | ORAL_TABLET | Freq: Every day | ORAL | Status: DC
  Administered 2019-08-04: 20 mg via ORAL
  Filled 2019-08-03: qty 2

## 2019-08-03 MED ORDER — FENTANYL CITRATE (PF) 250 MCG/5ML IJ SOLN
INTRAMUSCULAR | Status: AC
  Filled 2019-08-03: qty 5

## 2019-08-03 MED ORDER — ONDANSETRON HCL 4 MG/2ML IJ SOLN
INTRAMUSCULAR | Status: DC | PRN
  Administered 2019-08-03: 4 mg via INTRAVENOUS

## 2019-08-03 MED ORDER — LIDOCAINE HCL (PF) 1 % IJ SOLN
INTRAMUSCULAR | Status: AC
  Filled 2019-08-03: qty 30

## 2019-08-03 MED ORDER — SODIUM CHLORIDE 0.9 % IV SOLN
INTRAVENOUS | Status: DC | PRN
  Administered 2019-08-03: 500 mL

## 2019-08-03 MED ORDER — PHENOL 1.4 % MT LIQD: 1.0000 | OROMUCOSAL | Status: DC | PRN

## 2019-08-03 MED ORDER — HEPARIN SODIUM (PORCINE) 1000 UNIT/ML IJ SOLN
INTRAMUSCULAR | Status: DC | PRN
  Administered 2019-08-03: 8000 [IU] via INTRAVENOUS

## 2019-08-03 MED ORDER — PROPOFOL 10 MG/ML IV BOLUS
INTRAVENOUS | Status: DC | PRN
  Administered 2019-08-03: 10 mg via INTRAVENOUS
  Administered 2019-08-03: 50 mg via INTRAVENOUS

## 2019-08-03 MED ORDER — POLYETHYLENE GLYCOL 3350 17 G PO PACK: 17.0000 g | PACK | Freq: Every day | ORAL | Status: DC | PRN

## 2019-08-03 MED ORDER — ASPIRIN EC 81 MG PO TBEC
81.0000 mg | DELAYED_RELEASE_TABLET | Freq: Every day | ORAL | Status: DC
  Administered 2019-08-03 – 2019-08-04 (×2): 81 mg via ORAL
  Filled 2019-08-03 (×2): qty 1

## 2019-08-03 MED ORDER — CHLORHEXIDINE GLUCONATE 4 % EX LIQD: 60.0000 mL | Freq: Once | CUTANEOUS | Status: DC

## 2019-08-03 MED ORDER — CEFAZOLIN SODIUM-DEXTROSE 2-4 GM/100ML-% IV SOLN
2.0000 g | INTRAVENOUS | Status: AC
  Administered 2019-08-03: 08:00:00 2 g via INTRAVENOUS
  Filled 2019-08-03: qty 100

## 2019-08-03 MED ORDER — METOPROLOL TARTRATE 5 MG/5ML IV SOLN: 2.0000 mg | INTRAVENOUS | Status: DC | PRN

## 2019-08-03 MED ORDER — CEFAZOLIN SODIUM-DEXTROSE 2-4 GM/100ML-% IV SOLN
2.0000 g | Freq: Three times a day (TID) | INTRAVENOUS | Status: AC
  Administered 2019-08-03 (×2): 2 g via INTRAVENOUS
  Filled 2019-08-03 (×2): qty 100

## 2019-08-03 MED ORDER — DOCUSATE SODIUM 100 MG PO CAPS
100.0000 mg | ORAL_CAPSULE | Freq: Every day | ORAL | Status: DC
  Filled 2019-08-03: qty 1

## 2019-08-03 MED ORDER — SODIUM CHLORIDE 0.9 % IV SOLN: INTRAVENOUS | Status: DC

## 2019-08-03 MED ORDER — SODIUM CHLORIDE 0.9 % IV SOLN
INTRAVENOUS | Status: DC | PRN
  Administered 2019-08-03: .2 ug/kg/min via INTRAVENOUS

## 2019-08-03 MED ORDER — INSULIN ASPART 100 UNIT/ML ~~LOC~~ SOLN
0.0000 [IU] | Freq: Three times a day (TID) | SUBCUTANEOUS | Status: DC
  Administered 2019-08-03: 5 [IU] via SUBCUTANEOUS
  Administered 2019-08-04: 3 [IU] via SUBCUTANEOUS

## 2019-08-03 MED ORDER — SODIUM CHLORIDE 0.9 % IV SOLN: 500.0000 mL | Freq: Once | INTRAVENOUS | Status: DC | PRN

## 2019-08-03 MED ORDER — SUGAMMADEX SODIUM 200 MG/2ML IV SOLN
INTRAVENOUS | Status: DC | PRN
  Administered 2019-08-03: 200 mg via INTRAVENOUS

## 2019-08-03 MED ORDER — SODIUM CHLORIDE 0.9 % IV SOLN
0.0125 ug/kg/min | Freq: Once | INTRAVENOUS | Status: DC
  Filled 2019-08-03: qty 2000

## 2019-08-03 MED ORDER — ONDANSETRON HCL 4 MG/2ML IJ SOLN: 4.0000 mg | Freq: Four times a day (QID) | INTRAMUSCULAR | Status: DC | PRN

## 2019-08-03 MED ORDER — ACETAMINOPHEN 325 MG RE SUPP: 325.0000 mg | RECTAL | Status: DC | PRN

## 2019-08-03 SURGICAL SUPPLY — 47 items
ADH SKN CLS APL DERMABOND .7 (GAUZE/BANDAGES/DRESSINGS) ×2
CANISTER SUCT 3000ML PPV (MISCELLANEOUS) ×4 IMPLANT
CANNULA VESSEL 3MM 2 BLNT TIP (CANNULA) ×8 IMPLANT
CATH ROBINSON RED A/P 18FR (CATHETERS) ×4 IMPLANT
CLIP LIGATING EXTRA MED SLVR (CLIP) ×4 IMPLANT
CLIP LIGATING EXTRA SM BLUE (MISCELLANEOUS) ×4 IMPLANT
COVER WAND RF STERILE (DRAPES) ×4 IMPLANT
DECANTER SPIKE VIAL GLASS SM (MISCELLANEOUS) IMPLANT
DERMABOND ADVANCED (GAUZE/BANDAGES/DRESSINGS) ×2
DERMABOND ADVANCED .7 DNX12 (GAUZE/BANDAGES/DRESSINGS) ×2 IMPLANT
DRAIN HEMOVAC 1/8 X 5 (WOUND CARE) IMPLANT
ELECT REM PT RETURN 9FT ADLT (ELECTROSURGICAL) ×4
ELECTRODE REM PT RTRN 9FT ADLT (ELECTROSURGICAL) ×2 IMPLANT
EVACUATOR SILICONE 100CC (DRAIN) IMPLANT
GLOVE BIO SURGEON STRL SZ7.5 (GLOVE) ×2 IMPLANT
GLOVE BIOGEL PI IND STRL 7.0 (GLOVE) IMPLANT
GLOVE BIOGEL PI IND STRL 8 (GLOVE) IMPLANT
GLOVE BIOGEL PI INDICATOR 7.0 (GLOVE) ×4
GLOVE BIOGEL PI INDICATOR 8 (GLOVE) ×2
GLOVE SS BIOGEL STRL SZ 7.5 (GLOVE) ×2 IMPLANT
GLOVE SUPERSENSE BIOGEL SZ 7.5 (GLOVE) ×2
GLOVE SURG SS PI 6.5 STRL IVOR (GLOVE) ×2 IMPLANT
GOWN STRL REUS W/ TWL LRG LVL3 (GOWN DISPOSABLE) ×6 IMPLANT
GOWN STRL REUS W/ TWL XL LVL3 (GOWN DISPOSABLE) IMPLANT
GOWN STRL REUS W/TWL LRG LVL3 (GOWN DISPOSABLE) ×12
GOWN STRL REUS W/TWL XL LVL3 (GOWN DISPOSABLE) ×12
KIT BASIN OR (CUSTOM PROCEDURE TRAY) ×4 IMPLANT
KIT SHUNT ARGYLE CAROTID ART 6 (VASCULAR PRODUCTS) IMPLANT
KIT TURNOVER KIT B (KITS) ×4 IMPLANT
NEEDLE 22X1 1/2 (OR ONLY) (NEEDLE) IMPLANT
NS IRRIG 1000ML POUR BTL (IV SOLUTION) ×8 IMPLANT
PACK CAROTID (CUSTOM PROCEDURE TRAY) ×4 IMPLANT
PAD ARMBOARD 7.5X6 YLW CONV (MISCELLANEOUS) ×8 IMPLANT
PATCH HEMASHIELD 8X75 (Vascular Products) ×2 IMPLANT
POSITIONER HEAD DONUT 9IN (MISCELLANEOUS) ×4 IMPLANT
SHUNT CAROTID BYPASS 10 (VASCULAR PRODUCTS) ×2 IMPLANT
SHUNT CAROTID BYPASS 12FRX15.5 (VASCULAR PRODUCTS) IMPLANT
SUT ETHILON 3 0 PS 1 (SUTURE) IMPLANT
SUT PROLENE 6 0 CC (SUTURE) ×4 IMPLANT
SUT SILK 3 0 (SUTURE)
SUT SILK 3-0 18XBRD TIE 12 (SUTURE) IMPLANT
SUT VIC AB 3-0 SH 27 (SUTURE) ×8
SUT VIC AB 3-0 SH 27X BRD (SUTURE) ×4 IMPLANT
SUT VICRYL 4-0 PS2 18IN ABS (SUTURE) ×4 IMPLANT
SYR CONTROL 10ML LL (SYRINGE) IMPLANT
TOWEL GREEN STERILE (TOWEL DISPOSABLE) ×4 IMPLANT
WATER STERILE IRR 1000ML POUR (IV SOLUTION) ×4 IMPLANT

## 2019-08-03 NOTE — Op Note (Signed)
° °  OPERATIVE REPORT  DATE OF SURGERY: 08/03/2019  PATIENT: Thomas Gray, 78 y.o. male MRN: [id number]  DOB: 03-May-1941  PRE-OPERATIVE DIAGNOSIS: Right Carotid Stenosis, Asymptomatic  POST-OPERATIVE DIAGNOSIS:  Same  PROCEDURE:  Right Carotid Endarterectomy with Dacron Patch Angioplasty  SURGEON:  Curt Jews, M.D.  PHYSICIAN ASSISTANT: Dr. Monica Martinez, Liana Crocker, PA-C  ANESTHESIA:   general  EBL: Less than 200 ml  Total I/O In: 1600 [I.V.:1600] Out: 150 [Blood:150]  BLOOD ADMINISTERED: none  DRAINS: none   SPECIMEN: none  COUNTS CORRECT:  YES  PLAN OF CARE: Admit to inpatient   PATIENT DISPOSITION:  PACU - hemodynamically stable and neurologically intact.  PROCEDURE DETAILS: The patient was taken to the operating room placed in supine position.  General anesthesia was administered.  The neck was prepped and draped in the usual sterile fashion.  An incision was made anterior to the sternocleidomastoid and carried down through the platysma with electrocautery.  The sternocleidomastoid was reflected posteriorly and the carotid sheath was opened.  The facial vein was ligated with 2-0 silk ties and divided.  The common carotid artery was encircled with an umbilical tape and Rummel tourniquet.  The vagus nerve was identified and preserved.  Dissection was continued onto the carotid bifurcation.  The superior thyroid artery was encircled with a 2-0 silk Potts tie.  The external carotid was encircled with a blue vessel loop and the internal carotid was encircled with an umbilical tape and Rummel tourniquet.  The hypoglossal nerve was identified and preserved.  The patient was given systemic heparin and after adequate circulation time, the internal, external and common carotid arteries were occluded with vascular clamps.  The common carotid artery was opened with an 11 blade and extended  longitudinally with Potts scissors.  A 10 shunt was passed up the internal carotid and  allowed to backbleed.  It was then passed down the common carotid where it was secured with Rummel tourniquet.  The endarterectomy was begun on the common carotid artery and the plaque was divided proximally with Potts scissors.  The endarterectomy was continued onto the bifurcation.  The external carotid was endarterectomized with an eversion technique and the internal carotid was endarterectomized in an open fashion.  Remaining atheromatous debris was removed from the endarterectomy plane.  A Finesse Hemashield Dacron patch was brought onto the field and was sewn as a patch angioplasty with a running 6-0 Prolene suture.  Prior to completion of the closure the shunt was removed and the usual flushing maneuvers were undertaken.  The anastomosis was completed and flow was restored first to the external and then the internal carotid artery.  Excellent flow characteristics were noted with hand-held Doppler in the internal and external carotid arteries.  The patient was given 50 mg of protamine to reverse the heparin.  The wounds were irrigated with saline.  Hemostasis was obtained with electrocautery.  The wounds were closed with 3-0 Vicryl to reapproximate the sternocleidomastoid over the carotid sheath.  The platysma was lysed with a running 3-0 Vicryl suture.  The skin was closed with a 4-0 subcuticular Vicryl stitch.  Dermabond was applied.  The patient was awakened neurologically intact in the operating room and transferred to the recovery room in stable condition   Curt Jews, M.D. 08/03/2019 10:21 AM

## 2019-08-03 NOTE — Discharge Instructions (Signed)
° °  [facility]  ° °Discharge Instructions °  °Carotid Endarterectomy (CEA) ° °Please refer to the following instructions for your post-procedure care. Your surgeon or physician assistant will discuss any changes with you. ° °Activity ° °You are encouraged to walk as much as you can. You can slowly return to normal activities but must avoid strenuous activity and heavy lifting until your doctor tell you it's okay. Avoid activities such as vacuuming or swinging a golf club. You can drive after one week if you are comfortable and you are no longer taking prescription pain medications. It is normal to feel tired for serval weeks after your surgery. It is also normal to have difficulty with sleep habits, eating, and bowel movements after surgery. These will go away with time. ° °Bathing/Showering ° °Shower daily after you go home. Do not soak in a bathtub, hot tub, or swim until the incision heals completely. ° °Incision Care ° °Shower every day. Clean your incision with mild soap and water. Pat the area dry with a clean towel. You do not need a bandage unless otherwise instructed. Do not apply any ointments or creams to your incision. You may have skin glue on your incision. Do not peel it off. It will come off on its own in about one week. Your incision may feel thickened and raised for several weeks after your surgery. This is normal and the skin will soften over time.  ° °For Men Only: It's okay to shave around the incision but do not shave the incision itself for 2 weeks. It is common to have numbness under your chin that could last for several months. ° °Diet ° °Resume your normal diet. There are no special food restrictions following this procedure. A low fat/low cholesterol diet is recommended for all patients with vascular disease. In order to heal from your surgery, it is CRITICAL to get adequate nutrition. Your body requires vitamins, minerals, and protein. Vegetables are the  best source of vitamins and minerals. Vegetables also provide the perfect balance of protein. Processed food has little nutritional value, so try to avoid this. ° °Medications ° °Resume taking all of your medications unless your doctor or physician assistant tells you not to. If your incision is causing pain, you may take over-the- counter pain relievers such as acetaminophen (Tylenol). If you were prescribed a stronger pain medication, please be aware these medications can cause nausea and constipation. Prevent nausea by taking the medication with a snack or meal. Avoid constipation by drinking plenty of fluids and eating foods with a high amount of fiber, such as fruits, vegetables, and grains.  °Do not take Tylenol if you are taking prescription pain medications. ° °Follow Up ° °Our office will schedule a follow up appointment 2-3 weeks following discharge. ° °Please call us immediately for any of the following conditions ° °Increased pain, redness, drainage (pus) from your incision site. °Fever of 101 degrees or higher. °If you should develop stroke (slurred speech, difficulty swallowing, weakness on one side of your body, loss of vision) you should call 911 and go to the nearest emergency room. ° °Reduce your risk of vascular disease: ° °Stop smoking. If you would like help call [facility] at 1-800-QUIT-NOW (1-800-784-8669) or [facility] at [phone]. °Manage your cholesterol °Maintain a desired weight °Control your diabetes °Keep your blood pressure down ° °If you have any questions, please call the office at [phone]. ° °

## 2019-08-03 NOTE — H&P (Signed)
Office Visit  07/03/2019 [facility] -Judge Stall, Arvilla Meres, MD Vascular Surgery  Bilateral extracranial carotid artery stenosis Dx  Follow-up ; Referred by Algis Greenhouse, MD Reason for Visit  Additional Documentation  Vitals:   BP 179/76 (BP Location: Right Arm, Patient Position: Sitting, Cuff Size: Normal)  Pulse 75  Temp 97.8 F (36.6 C)  Resp 20  Ht 5\' 6"  (1.676 m)  Wt 83.8 kg  SpO2 99%  BMI 29.83 kg/m  BSA 1.98 m    More Vitals  Flowsheets:   Clinical Intake,  Healthcare Directives,  Vital Signs,  MEWS Score,  Anthropometrics,  Method of Visit    Encounter Info:   Billing Info,  History,  Allergies,  Detailed Report    Orthostatic Vitals Recorded in This Encounter   07/03/2019  1415 07/03/2019  1418    Patient Position: Sitting Sitting    BP Location: Left Arm Right Arm    Cuff Size: Normal Normal    All Notes  Progress Notes by Rosetta Posner, MD at 07/03/2019 2:20 PM Author: Rosetta Posner, MD Author Type: Physician Filed: 07/03/2019 5:15 PM  Note Status: Signed Cosign: Cosign Not Required Encounter Date: 07/03/2019  Editor: Rosetta Posner, MD (Physician)                                          [facility] of [facility]  Patient name: Thomas Gray   MRN: [id number]        DOB: May 06, 1941          Sex: male  REASON FOR VISIT: Discuss critical right carotid stenosis  HPI: Thomas Gray is a 78 y.o. male well-known to me from prior left carotid endarterectomy.  He underwent combined left carotid endarterectomy by myself and coronary artery bypass grafting by Dr. Roxan Hockey in 2011.  He has done well following the surgery.  He has had no neurologic deficits.  He has been under duplex surveillance since that time and has had progression of his disease.  He underwent recent repeat carotid duplex showing critical right internal carotid artery stenosis.  His systolic velocities are 123456 cm/s and end-diastolic  velocities 0000000 cm/s.  He is here today for further discussion      Past Medical History:  Diagnosis Date  . Anemia   . CAD (coronary artery disease)    12/29/2011 normal R/S nuclear study;08/05/2010 cath smooth 50% ostial left main 60% distal left main, 60% ca+ smooth prox LAD, 70% ramus intermed., 90% prox. near ostial, 80% prox. CX, diffuse 30%prox. RCA, 95% eccentric stenosis mid RCA 5o0-90%distal RCA; 50% distal aortic stenosis  . 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 COPD (chronic obstructive pulmonary disease) ([place])   . H/O discoid lupus erythematosus   . H/O discoid lupus erythematosus   . Heart murmur    Echo:  Mitral regurg. EF 55-65%  . Hyperlipidemia   . Hypertension   . 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 Misaki Sozio and Hendrickson         Family History  Problem Relation Age of Onset  . Hypertension Mother   . Diabetes Mother   . Heart disease Mother        After age 21    SOCIAL HISTORY: Social History  Tobacco Use  . Smoking status: Former Smoker    Packs/day: 1.00    Types: Cigarettes    Quit date: 08/07/2010    Years since quitting: 8.9  . Smokeless tobacco: Never Used  Substance Use Topics  . Alcohol use: Yes    Comment: 1 glass of wine weekly    No Known Allergies        Current Outpatient Medications  Medication Sig Dispense Refill  . aspirin EC 81 MG tablet Take 81 mg by mouth daily.      Marland Kitchen atorvastatin (LIPITOR) 20 MG tablet TAKE 1 TABLET BY MOUTH EVERY DAY 90 tablet 3  . hydrochlorothiazide (HYDRODIURIL) 25 MG tablet TAKE 1 TABLET (25 MG TOTAL) BY MOUTH DAILY. KEEP OV. 90 tablet 1  . metFORMIN (GLUCOPHAGE) 1000 MG tablet 500 mg 2 (two) times daily with a meal.   3  . metFORMIN (GLUCOPHAGE) 1000 MG tablet Take by mouth.    . metoprolol tartrate (LOPRESSOR) 50 MG tablet TAKE 1 TABLET BY MOUTH TWICE A DAY 180 tablet 3  . olmesartan (BENICAR) 20 MG tablet Take  1 tablet (20 mg total) by mouth daily. 30 tablet 6  . omeprazole (PRILOSEC) 40 MG capsule TAKE 1 CAPSULE BY MOUTH EVERY DAY 90 capsule 0   No current facility-administered medications for this visit.     REVIEW OF SYSTEMS:  [X]  denotes positive finding, [ ]  denotes negative finding Cardiac  Comments:  Chest pain or chest pressure:    Shortness of breath upon exertion:    Short of breath when lying flat:    Irregular heart rhythm:        Vascular    Pain in calf, thigh, or hip brought on by ambulation:    Pain in feet at night that wakes you up from your sleep:     Blood clot in your veins:    Leg swelling:           PHYSICAL EXAM:     Vitals:   07/03/19 1415 07/03/19 1418  BP: (!) 196/68 (!) 179/76  Pulse: 75   Resp: 20   Temp: 97.8 F (36.6 C)   SpO2: 99%   Weight: 83.8 kg   Height: 5\' 6"  (1.676 m)     GENERAL: The patient is a well-nourished male, in no acute distress. The vital signs are documented above. CARDIOVASCULAR: Well-healed left carotid incision.  No carotid bruits bilaterally.  2+ radial pulses bilaterally PULMONARY: There is good air exchange  MUSCULOSKELETAL: There are no major deformities or cyanosis. NEUROLOGIC: No focal weakness or paresthesias are detected. SKIN: There are no ulcers or rashes noted. PSYCHIATRIC: The patient has a normal affect.  DATA:  Carotid duplex from 05/28/2019 reveal critical right carotid stenosis.  No left carotid stenosis  Lexiscan Myoview on 06/15/2019 showed no evidence of reversible ischemia  MEDICAL ISSUES: Had a long discussion with the patient.  I explained that he has had progression to critical level of stenosis in his right internal carotid artery.  I have recommended endarterectomy for reduction of stroke risk.  I explained the procedure including 1 to 2% risk of stroke with surgery.  Also the slight risk of cranial nerve injury.  Patient understands and wished to proceed with  surgery.  We will schedule this at his earliest [place]. Thomas Knaggs, MD [facility] of [facility] Tel [phone] Pager [phone]     Communications     Addendum:  The patient has been re-examined and re-evaluated.  The patient's history and physical has been reviewed and is unchanged.    Thomas Gray is a 78 y.o. male is being admitted with RIGHT CAROTID ARTERY STENOSIS. All the risks, benefits and other treatment options have been discussed with the patient. The patient has consented to proceed with Procedure(s): ENDARTERECTOMY RIGHT CAROTID as a surgical intervention.  Ayyub Krall 08/03/2019 7:24 AM Vascular and Vein Surgery

## 2019-08-03 NOTE — Transfer of Care (Signed)
Immediate Anesthesia Transfer of Care Note  Patient: Thomas Gray  Procedure(s) Performed: ENDARTERECTOMY RIGHT CAROTID (Right ) Patch Angioplasty using Hemashield Platinum Finesse. (Right Neck)  Patient Location: PACU  Anesthesia Type:General  Level of Consciousness: awake, alert , oriented and patient cooperative  Airway & Oxygen Therapy: Patient Spontanous Breathing and Patient connected to face mask oxygen  Post-op Assessment: Report given to RN, Post -op Vital signs reviewed and stable, Patient moving all extremities X 4 and Patient able to stick tongue midline  Post vital signs: Reviewed and stable  Last Vitals:  Vitals Value Taken Time  BP 169/71 08/03/19 1021  Temp    Pulse 76 08/03/19 1026  Resp 19 08/03/19 1026  SpO2 95 % 08/03/19 1026  Vitals shown include unvalidated device data.  Last Pain:  Vitals:   08/03/19 0633  TempSrc:   PainSc: 0-No pain         Complications: No apparent anesthesia complications

## 2019-08-03 NOTE — Progress Notes (Signed)
Pt transferred to 4E-02 via bed from PACU. Pt given CHG bath. Tele applied, CCMD notified. Pt oriented to call bell, room and bed. Call bell within reach. VSS. Neuro intact. Will continue to monitor.  Amanda Cockayne, RN

## 2019-08-03 NOTE — Anesthesia Procedure Notes (Addendum)
Procedure Name: Intubation Performed by: Lowella Dell, CRNA Pre-anesthesia Checklist: Patient identified, Emergency Drugs available, Suction available, Patient being monitored and Timeout performed Patient Re-evaluated:Patient Re-evaluated prior to induction Oxygen Delivery Method: Circle system utilized and Simple face mask Preoxygenation: Pre-oxygenation with 100% oxygen Induction Type: IV induction Ventilation: Oral airway inserted - appropriate to patient size and Mask ventilation without difficulty Laryngoscope Size: Glidescope and 4 Grade View: Grade I Tube type: Oral Number of attempts: 1 Airway Equipment and Method: Rigid stylet Placement Confirmation: ETT inserted through vocal cords under direct vision,  positive ETCO2 and breath sounds checked- equal and bilateral Secured at: 23 cm Tube secured with: Tape Dental Injury: Teeth and Oropharynx as per pre-operative assessment  Comments: Elective glidescope d/t dental implants

## 2019-08-03 NOTE — Anesthesia Procedure Notes (Addendum)
Arterial Line Insertion Start/End9/09/2019 6:45 AM, 08/03/2019 7:15 AM Performed by: Lowella Dell, CRNA, CRNA  Preanesthetic checklist: patient identified, IV checked, site marked, risks and benefits discussed, surgical consent, monitors and equipment checked, pre-op evaluation and timeout performed Lidocaine 1% used for infiltration Left, radial was placed Catheter size: 20 G Hand hygiene performed  and maximum sterile barriers used  Allen's test indicative of satisfactory collateral circulation Attempts: 2 Procedure performed without using ultrasound guided technique. Following insertion, dressing applied and Biopatch. Post procedure assessment: normal and unchanged  Patient tolerated the procedure well with no immediate complications.

## 2019-08-03 NOTE — Progress Notes (Signed)
  Day of Surgery Note    Subjective:  In recovery-resting comfortably and follows commands   Vitals:   08/03/19 0610 08/03/19 0637  BP: (!) 223/68 (!) 184/56  Pulse:    Resp:    Temp: 98 F (36.7 C)   SpO2:      Incisions:   Right neck incision is clean with mild fullness Extremities:  Moving all extremities equally Cardiac:  regular Lungs:  Non labored Neuro:  In tact; tongue is midline; following commands   Assessment/Plan:  This is a 78 y.o. male who is s/p  Right carotid endarterectomy  -pt doing well in recovery.  He does have mild fullness around right neck incision.  Will continue to watch. -BP parameters systolic [id number] -holding Metformin for creatinine of 1.59.  SSI -to [employer] later today   [employer], PA-C 08/03/2019 10:25 AM [phone]

## 2019-08-04 LAB — BASIC METABOLIC PANEL
Anion gap: 9 (ref 5–15)
BUN: 25 mg/dL — ABNORMAL HIGH (ref 8–23)
CO2: 21 mmol/L — ABNORMAL LOW (ref 22–32)
Calcium: 8.1 mg/dL — ABNORMAL LOW (ref 8.9–10.3)
Chloride: 107 mmol/L (ref 98–111)
Creatinine, Ser: 1.57 mg/dL — ABNORMAL HIGH (ref 0.61–1.24)
GFR calc Af Amer: 48 mL/min — ABNORMAL LOW (ref >60)
GFR calc non Af Amer: 41 mL/min — ABNORMAL LOW (ref >60)
Glucose, Bld: 218 mg/dL — ABNORMAL HIGH (ref 70–99)
Potassium: 4.3 mmol/L (ref 3.5–5.1)
Sodium: 137 mmol/L (ref 135–145)

## 2019-08-04 LAB — CBC
HCT: 27.7 % — ABNORMAL LOW (ref 39.0–52.0)
Hemoglobin: 9.2 g/dL — ABNORMAL LOW (ref 13.0–17.0)
MCH: 31.6 pg (ref 26.0–34.0)
MCHC: 33.2 g/dL (ref 30.0–36.0)
MCV: 95.2 fL (ref 80.0–100.0)
Platelets: 195 10*3/uL (ref 150–400)
RBC: 2.91 MIL/uL — ABNORMAL LOW (ref 4.22–5.81)
RDW: 20 % — ABNORMAL HIGH (ref 11.5–15.5)
WBC: 10.8 10*3/uL — ABNORMAL HIGH (ref 4.0–10.5)
nRBC: 0.3 % — ABNORMAL HIGH (ref 0.0–0.2)

## 2019-08-04 LAB — GLUCOSE, CAPILLARY: Glucose-Capillary: 199 mg/dL — ABNORMAL HIGH (ref 70–99)

## 2019-08-04 MED ORDER — OXYCODONE-ACETAMINOPHEN 5-325 MG PO TABS: 1.0000 | ORAL_TABLET | ORAL | 0 refills | Status: DC | PRN

## 2019-08-04 NOTE — Progress Notes (Signed)
Truman Hayward R.N. does all care and assessment  with the Art. Line.

## 2019-08-04 NOTE — Anesthesia Postprocedure Evaluation (Signed)
Anesthesia Post Note  Patient: Thomas Gray  Procedure(s) Performed: ENDARTERECTOMY RIGHT CAROTID (Right ) Patch Angioplasty using Hemashield Platinum Finesse. (Right Neck)     Patient location during evaluation: PACU Anesthesia Type: General Level of consciousness: awake and alert Pain management: pain level controlled Vital Signs Assessment: post-procedure vital signs reviewed and stable Respiratory status: spontaneous breathing, nonlabored ventilation, respiratory function stable and patient connected to nasal cannula oxygen Cardiovascular status: blood pressure returned to baseline and stable Postop Assessment: no apparent nausea or vomiting Anesthetic complications: no    Last Vitals:  Vitals:   08/04/19 0700 08/04/19 0833  BP: (!) 111/48 (!) 124/50  Pulse: 70 87  Resp: 14 15  Temp: 36.9 C 36.9 C  SpO2: 92% 97%    Last Pain:  Vitals:   08/04/19 0833  TempSrc: Oral  PainSc: 0-No pain                 Tiajuana Amass

## 2019-08-04 NOTE — Progress Notes (Signed)
  Progress Note    08/04/2019 10:25 AM 1 Day Post-Op  Subjective: Overnight he had some agitation has resolved this morning  Vitals:   08/04/19 0700 08/04/19 0833  BP: (!) 111/48 (!) 124/50  Pulse: 70 87  Resp: 14 15  Temp: 98.4 F (36.9 C) 98.4 F (36.9 C)  SpO2: 92% 97%    Physical Exam: Awake alert oriented Nonlabored aspirations Right neck is soft He is sensorimotor intact bilateral upper lower extremities Tongue is midline Heart rate is regular with regular rhythm with palpable radial and pedal pulses today  CBC    Component Value Date/Time   WBC 10.8 (H) 08/04/2019 0500   RBC 2.91 (L) 08/04/2019 0500   HGB 9.2 (L) 08/04/2019 0500   HCT 27.7 (L) 08/04/2019 0500   PLT 195 08/04/2019 0500   MCV 95.2 08/04/2019 0500   MCH 31.6 08/04/2019 0500   MCHC 33.2 08/04/2019 0500   RDW 20.0 (H) 08/04/2019 0500    BMET    Component Value Date/Time   NA 137 08/04/2019 0500   K 4.3 08/04/2019 0500   CL 107 08/04/2019 0500   CO2 21 (L) 08/04/2019 0500   GLUCOSE 218 (H) 08/04/2019 0500   BUN 25 (H) 08/04/2019 0500   CREATININE 1.57 (H) 08/04/2019 0500   CALCIUM 8.1 (L) 08/04/2019 0500   GFRNONAA 41 (L) 08/04/2019 0500   GFRAA 48 (L) 08/04/2019 0500    INR    Component Value Date/Time   INR 1.1 07/31/2019 1137     Intake/Output Summary (Last 24 hours) at 08/04/2019 1025 Last data filed at 08/04/2019 [id number] Gross per 24 hour  Intake 442.68 ml  Output 575 ml  Net -132.32 ml     Assessment:  78 y.o. male is postop day 1 right carotid endarterectomy.  Had some agitation overnight has resolved this morning.  Overall appears fine for discharge we will have him follow-up with Dr. [name] in a few weeks.    Thomas Gray C. Donzetta Matters, MD [facility] [place] Office: [phone] Pager: [phone]  08/04/2019 10:25 AM

## 2019-08-04 NOTE — Discharge Summary (Signed)
   Physician Discharge Summary  Patient ID: Thomas Gray MRN: [id number] DOB/AGE: 11-24-1940 78 y.o.  Admit date: 08/03/2019 Discharge date: 08/04/19   Admission Diagnosis: Asymptomatic carotid artery stenosis  Discharge Diagnoses:  Same  Secondary Diagnoses: Active Problems:   Asymptomatic carotid artery stenosis without infarction, left   VQI: Discharged on aspirin and statin   Procedures: Right carotid endarterectomy  Discharged Condition: good  Hospital Course: Patient was admitted for carotid endarterectomy.  He underwent this procedure without any complication.  Postoperatively he was admitted to the transitional care unit on 4 E.  Overnight he did have some agitation this resolved with control of his heart rate and blood pressure as well as minor doses of pain medicine.  In the morning he was noted to be looking fine was neurologically intact and was discharged to home.  Significant Diagnostic Studies: CBC CBC Latest Ref Rng & Units 08/04/2019 07/31/2019 08/11/2010  WBC 4.0 - 10.5 K/uL 10.8(H) 7.7 8.8  Hemoglobin 13.0 - 17.0 g/dL 9.2(L) 11.6(L) 8.7(L)  Hematocrit 39.0 - 52.0 % 27.7(L) 35.1(L) 25.7(L)  Platelets 150 - 400 K/uL 195 234 189     COAG Lab Results  Component Value Date   INR 1.1 07/31/2019   INR 1.74 (H) 08/07/2010   INR 1.11 08/06/2010   No results found for: PTT  Disposition: Discharge disposition: 01-Home or Self Care        Allergies as of 08/04/2019   No Known Allergies     Medication List    TAKE these medications   acidophilus Caps capsule Take 1 capsule by mouth daily.   aspirin EC 81 MG tablet Take 81 mg by mouth daily.   atorvastatin 20 MG tablet Commonly known as: LIPITOR TAKE 1 TABLET BY MOUTH EVERY DAY   hydrochlorothiazide 25 MG tablet Commonly known as: HYDRODIURIL TAKE 1 TABLET (25 MG TOTAL) BY MOUTH DAILY. KEEP OV. What changed: additional instructions   metFORMIN 500 MG tablet Commonly known as: GLUCOPHAGE  Take 500 mg by mouth 2 (two) times daily with a meal.   metoprolol tartrate 50 MG tablet Commonly known as: LOPRESSOR TAKE 1 TABLET BY MOUTH TWICE A DAY   olmesartan 20 MG tablet Commonly known as: BENICAR Take 1 tablet (20 mg total) by mouth daily.   omeprazole 40 MG capsule Commonly known as: PRILOSEC TAKE 1 CAPSULE BY MOUTH EVERY DAY What changed: how much to take   oxyCODONE-acetaminophen 5-325 MG tablet Commonly known as: PERCOCET/ROXICET Take 1-2 tablets by mouth every 4 (four) hours as needed for moderate pain.      Follow-up Information    Early, Arvilla Meres, MD In 3 weeks.   Specialties: Vascular Surgery, Cardiology Why: Office will call you to arrange your appt (sent) Contact information: [place] [address] [place], [facility] Follow up.   Specialty: [place] Why: pre-op referral for any HH needs- they will f/u on return home Contact information: [place] [id number] [phone]           Signed:  Eda Paschal. Donzetta Matters, MD [facility] [place] Office: [phone] Pager: [phone]   08/04/2019, 1:42 PM

## 2019-08-04 NOTE — Progress Notes (Signed)
Patient in bed and H.R. up 137 S.T. Patient c/o "I feel my heart racing." Vital signs taken B.P. 161/70 , P-135 Rm, Air sats 100 % Patient c/o feeling hot. Truman Hayward R.N. Charge nurse aware and into see patient.  EKG done confirm S.T. Neuro is intact no changes.  Patient is anxious. Emotional support given . 03:01 B.P. 181/70 P-117 Labetalol 10 mg I.V. given . B.P. 169/60 P- 102 after Labetalol given.03:12 B.P. 144/73 P-104   See vital sign flow sheet for more Vital Signs. One Percocet P.O. given for some mild Neck tightening. Erin Hearing. Rapid Response aware and Spoke with Dr. Donzetta Matters on phone and aware of the above. NO orders at this time. Cont. To monitor patient and Vital signs.

## 2019-08-06 ENCOUNTER — Encounter (HOSPITAL_COMMUNITY): Payer: Self-pay | Admitting: Vascular Surgery

## 2019-08-16 ENCOUNTER — Other Ambulatory Visit: Payer: Self-pay | Admitting: Cardiovascular Disease

## 2019-08-28 ENCOUNTER — Other Ambulatory Visit: Payer: Self-pay

## 2019-08-28 ENCOUNTER — Encounter: Payer: Self-pay | Admitting: Vascular Surgery

## 2019-08-28 ENCOUNTER — Ambulatory Visit (INDEPENDENT_AMBULATORY_CARE_PROVIDER_SITE_OTHER): Payer: Self-pay | Admitting: Vascular Surgery

## 2019-08-28 VITALS — BP 185/72 | HR 66 | Temp 97.7°F | Resp 20 | Ht 66.0 in | Wt 183.0 lb

## 2019-08-28 DIAGNOSIS — I6523 Occlusion and stenosis of bilateral carotid arteries: Secondary | ICD-10-CM

## 2019-08-28 NOTE — Progress Notes (Signed)
   Patient name: Thomas Gray MRN: [id number] DOB: [birth year] Sex: male  REASON FOR VISIT: Follow-up right carotid endarterectomy on 08/03/2019  HPI: Thomas Gray is a 78 y.o. male here for follow-up of right carotid endarterectomy for severe asymptomatic carotid disease on 08/03/2019.  He is status post left carotid endarterectomy in September 2011 which was done in conjunction with coronary artery bypass grafting.  He did well in the hospital was discharged home on postoperative day 1.  He reports the usual peri-incisional numbness and mild soreness.  Current Outpatient Medications  Medication Sig Dispense Refill  . acidophilus (RISAQUAD) CAPS capsule Take 1 capsule by mouth daily.    Marland Kitchen aspirin EC 81 MG tablet Take 81 mg by mouth daily.      Marland Kitchen atorvastatin (LIPITOR) 20 MG tablet TAKE 1 TABLET BY MOUTH EVERY DAY (Patient taking differently: Take 20 mg by mouth daily. ) 90 tablet 3  . hydrochlorothiazide (HYDRODIURIL) 25 MG tablet TAKE 1 TABLET (25 MG TOTAL) BY MOUTH DAILY. KEEP OV. (Patient taking differently: Take 25 mg by mouth daily. ) 90 tablet 1  . metFORMIN (GLUCOPHAGE) 500 MG tablet Take 500 mg by mouth 2 (two) times daily with a meal.   3  . metoprolol tartrate (LOPRESSOR) 50 MG tablet TAKE 1 TABLET BY MOUTH TWICE A DAY (Patient taking differently: Take 50 mg by mouth 2 (two) times daily. ) 180 tablet 3  . olmesartan (BENICAR) 20 MG tablet TAKE 1 TABLET BY MOUTH EVERY DAY 90 tablet 2  . omeprazole (PRILOSEC) 40 MG capsule TAKE 1 CAPSULE BY MOUTH EVERY DAY (Patient taking differently: Take 40 mg by mouth daily. ) 90 capsule 0  . oxyCODONE-acetaminophen (PERCOCET/ROXICET) 5-325 MG tablet Take 1-2 tablets by mouth every 4 (four) hours as needed for moderate pain. (Patient not taking: Reported on 08/28/2019) 30 tablet 0   No current facility-administered medications for this visit.      PHYSICAL EXAM: Vitals:   08/28/19 1437 08/28/19 1440  BP: (!) 176/67 (!)  185/72  Pulse: 66   Resp: 20   Temp: 97.7 F (36.5 C)   SpO2: 100%   Weight: 183 lb (83 kg)   Height: 5\' 6"  (1.676 m)     GENERAL: The patient is a well-nourished male, in no acute distress. The vital signs are documented above. Right neck incision is well-healed with a typical healing ridge and some mild peri-incisional numbness.  Neurologically intact  MEDICAL ISSUES: Stable status post right carotid endarterectomy and Dacron patch angioplasty.  Will resume full activity without limitation.  We will see him again in 9 months with carotid duplex follow-up   Rosetta Posner, MD [facility] of [facility] Tel [phone] Pager [phone]

## 2019-08-31 ENCOUNTER — Telehealth: Payer: Self-pay | Admitting: Cardiovascular Disease

## 2019-08-31 NOTE — Telephone Encounter (Signed)
New Message:    Pt was in [facility] on 08-04-19 and had a Cardiac episode. He wants Dr Gwenlyn Found to review this EKG please. It was abnormal and he did not understand it. It was done on 08-04-19 at 0255.

## 2019-08-31 NOTE — Telephone Encounter (Signed)
Please have Dr.Berry review the patients EKG while in hospital- he wants him to review and give explanation.  Will route to MD.

## 2019-09-01 NOTE — Telephone Encounter (Signed)
I have reviewed the EKGs Thomas Gray referred to during his Sept hospitalization for elective CEA. There were 2 EKGs that showed diffuse ST depression during sinus tachy consistent with sub endocardial ischemia. It's unclear why he has tachycardic during these episodes in the post operative time period. He did have a completely nl MV in July. When I saw him virtually in April he was asymptomatic. If he's having cardiac symptoms I would be happy to see back sooner than his regularly schedules appointment

## 2019-09-03 NOTE — Telephone Encounter (Signed)
LM that MD advice concerning EKGs would be sent to MyChart for review and he can respond there or call back with questions

## 2019-10-21 ENCOUNTER — Other Ambulatory Visit: Payer: Self-pay | Admitting: Cardiovascular Disease

## 2019-11-10 ENCOUNTER — Other Ambulatory Visit: Payer: Self-pay | Admitting: Cardiovascular Disease

## 2020-01-01 DIAGNOSIS — L84 Corns and callosities: Secondary | ICD-10-CM | POA: Insufficient documentation

## 2020-01-01 DIAGNOSIS — M216X1 Other acquired deformities of right foot: Secondary | ICD-10-CM | POA: Insufficient documentation

## 2020-01-01 DIAGNOSIS — S9031XA Contusion of right foot, initial encounter: Secondary | ICD-10-CM | POA: Insufficient documentation

## 2020-01-04 ENCOUNTER — Other Ambulatory Visit: Payer: Self-pay | Admitting: Cardiovascular Disease

## 2020-02-21 DEATH — deceased

## 2020-04-13 ENCOUNTER — Other Ambulatory Visit: Payer: Self-pay | Admitting: Cardiovascular Disease

## 2020-04-16 ENCOUNTER — Other Ambulatory Visit: Payer: Self-pay

## 2020-04-16 MED ORDER — OMEPRAZOLE 40 MG PO CPDR: 40.0000 mg | DELAYED_RELEASE_CAPSULE | Freq: Every day | ORAL | 0 refills | Status: DC

## 2020-04-27 ENCOUNTER — Other Ambulatory Visit: Payer: Self-pay | Admitting: Cardiovascular Disease

## 2020-05-03 ENCOUNTER — Other Ambulatory Visit: Payer: Self-pay | Admitting: Cardiovascular Disease

## 2020-05-11 ENCOUNTER — Other Ambulatory Visit: Payer: Self-pay | Admitting: Cardiovascular Disease

## 2020-05-14 ENCOUNTER — Encounter: Payer: Self-pay | Admitting: Cardiovascular Disease

## 2020-05-14 ENCOUNTER — Ambulatory Visit (INDEPENDENT_AMBULATORY_CARE_PROVIDER_SITE_OTHER): Payer: Medicare Other | Admitting: Cardiovascular Disease

## 2020-05-14 ENCOUNTER — Other Ambulatory Visit: Payer: Self-pay

## 2020-05-14 ENCOUNTER — Telehealth (HOSPITAL_COMMUNITY): Payer: Self-pay

## 2020-05-14 VITALS — BP 170/78 | HR 73 | Ht 66.0 in | Wt 183.0 lb

## 2020-05-14 DIAGNOSIS — I10 Essential (primary) hypertension: Secondary | ICD-10-CM

## 2020-05-14 DIAGNOSIS — I2510 Atherosclerotic heart disease of native coronary artery without angina pectoris: Secondary | ICD-10-CM

## 2020-05-14 DIAGNOSIS — Z951 Presence of aortocoronary bypass graft: Secondary | ICD-10-CM

## 2020-05-14 DIAGNOSIS — I6522 Occlusion and stenosis of left carotid artery: Secondary | ICD-10-CM | POA: Diagnosis not present

## 2020-05-14 DIAGNOSIS — R0602 Shortness of breath: Secondary | ICD-10-CM

## 2020-05-14 DIAGNOSIS — R0789 Other chest pain: Secondary | ICD-10-CM

## 2020-05-14 DIAGNOSIS — E782 Mixed hyperlipidemia: Secondary | ICD-10-CM

## 2020-05-14 NOTE — Patient Instructions (Signed)
Medication Instructions:  Your physician recommends that you continue on your current medications as directed. Please refer to the Current Medication list given to you today.  *If you need a refill on your cardiac medications before your next appointment, please call your pharmacy*   Lab Work: Your physician recommends that you return for a FASTING lipid profile and hepatic function panel in 1-2 weeks. Please take your lab slips with you.  If you have labs (blood work) drawn today and your tests are completely normal, you will receive your results only by: Marland Kitchen MyChart Message (if you have MyChart) OR . A paper copy in the mail If you have any lab test that is abnormal or we need to change your treatment, we will call you to review the results.   Testing/Procedures: Your physician has requested that you have an exercise stress myoview. For further information please visit [email or web address]. Please follow instruction sheet, as given.  Your physician has requested that you have an echocardiogram. Echocardiography is a painless test that uses sound waves to create images of your heart. It provides your doctor with information about the size and shape of your heart and how well your heart's chambers and valves are working. This procedure takes approximately one hour. There are no restrictions for this procedure.   Follow-Up: At [facility], you and your health needs are our priority.  As part of our continuing mission to provide you with exceptional heart care, we have created designated Provider Care Teams.  These Care Teams include your primary Cardiologist (physician) and Advanced Practice Providers (APPs -  Physician Assistants and Nurse Practitioners) who all work together to provide you with the care you need, when you need it.  We recommend signing up for the patient portal called "MyChart".  Sign up information is provided on this After Visit Summary.  MyChart is used to connect with  patients for Virtual Visits (Telemedicine).  Patients are able to view lab/test results, encounter notes, upcoming appointments, etc.  Non-urgent messages can be sent to your provider as well.   To learn more about what you can do with MyChart, go to [email or web address].    Your next appointment:   6 month(s)  The format for your next appointment:   In Person  Provider:   You may see Quay Burow, MD or one of the following Advanced Practice Providers on your designated Care Team:    Kerin Ransom, PA-C  [place], [place]  Coletta Memos, [place]    Other Instructions Please call our office 2 months in advance to schedule your follow-up appointment with Dr. Gwenlyn Found. We will call you with the results of your tests.

## 2020-05-14 NOTE — Assessment & Plan Note (Signed)
History of essential hypertension with blood pressure measured today in the office of 170/78.  He says he checks his blood pressure at home it usually in the 658-006 range systolic.  He is on hydrochlorothiazide, metoprolol and olmesartan.

## 2020-05-14 NOTE — Assessment & Plan Note (Addendum)
History of hyperlipidemia on statin therapy followed by his PCP.  Labs performed by his PCP in April revealed a total cholesterol 124 and LDL 75.

## 2020-05-14 NOTE — Telephone Encounter (Signed)
Encounter complete. 

## 2020-05-14 NOTE — Assessment & Plan Note (Signed)
History of CAD status post CABG x4 by Dr. Roxan Hockey in 2011.  He had a nonischemic Myoview 06/15/2019 for preoperative clearance before elective right carotid endarterectomy 2 months later.  Over last 2 months he is noticed increasing dyspnea on exertion and some atypical chest pain.  I am going to get exercise Myoview a 2D echo to further evaluate.

## 2020-05-14 NOTE — Progress Notes (Signed)
05/14/2020 [name]   [birth year]  [id number]  Primary Physician Garlon Hatchet Jaymes Graff, MD Primary Cardiologist: [name] MD Garret Reddish, [place], [place]  HPI:  Thomas Gray is a 79 y.o.  moderately overweight the first Caucasian male father of 3 sons, grandfather to 5 granddaughters who was formerly a patient of Dr. Terance Ice. I last saw him by a virtual telemedicine video visit 03/21/2019.  His primary care physician is Dr. [name] in [place]. His cardiac risk factor profile is significant for 50 pack years of tobacco abuse having quit 4 years ago at the time of his coronary artery bypass grafting. History of hypertension and hyperlipidemia. There is no family history. He has never had a heart attack or stroke. He denies chest pain or shortness of breath. He had coronary artery bypass grafting x4 by Dr. Merilynn Finland in 2011 concomitant left carotid endarterectomy performed by Dr. Curt Jews. A Myoview stress test performed in 2013 was nonischemic. He denies chest pain or shortness of breath.He apparently recently had carotid Dopplers 11/27/2018 that showed a widely patent left endarterectomy site with at least moderate to moderately severe right ICA stenosis representing some mild progression.   He exercises at [facility] wellness program at least 5 days a week without symptoms.  His normal blood pressures there are 130/60 with a pulse of 65.Has had mild progression of his carotid artery by duplex ultrasound followed by Dr. .[name].He had a negative Myoview stress test done for preoperative clearance 06/15/2019 and ultimately underwent elective right carotid endarterectomy by Dr. [name] 2 months later.  Over the last 2 months he is noticed increasing dyspnea on exertion and some atypical chest pain.  Current Meds  Medication Sig  . acidophilus (RISAQUAD) CAPS capsule Take 1 capsule by mouth daily.  Marland Kitchen aspirin EC 81 MG tablet Take 81 mg by mouth daily.    Marland Kitchen atorvastatin  (LIPITOR) 20 MG tablet TAKE 1 TABLET BY MOUTH EVERY DAY  . hydrochlorothiazide (HYDRODIURIL) 25 MG tablet TAKE 1 TABLET BY MOUTH EVERY DAY  . metFORMIN (GLUCOPHAGE) 500 MG tablet Take 500 mg by mouth 2 (two) times daily with a meal.   . metoprolol tartrate (LOPRESSOR) 50 MG tablet Take 1 tablet (50 mg total) by mouth 2 (two) times daily.  Marland Kitchen olmesartan (BENICAR) 20 MG tablet TAKE 1 TABLET BY MOUTH EVERY DAY  . omeprazole (PRILOSEC) 40 MG capsule Take 1 capsule (40 mg total) by mouth daily. KEEP OV.  . [DISCONTINUED] oxyCODONE-acetaminophen (PERCOCET/ROXICET) 5-325 MG tablet Take 1-2 tablets by mouth every 4 (four) hours as needed for moderate pain.     No Known Allergies  Social History   Socioeconomic History  . Marital status: Single    Spouse name: Not on file  . Number of children: Not on file  . Years of education: Not on file  . Highest education level: Not on file  Occupational History  . Not on file  Tobacco Use  . Smoking status: Former Smoker    Packs/day: 1.00    Types: Cigarettes    Quit date: 08/07/2010    Years since quitting: 9.7  . Smokeless tobacco: Never Used  Vaping Use  . Vaping Use: Never used  Substance and Sexual Activity  . Alcohol use: Yes    Comment: 1 glass of wine weekly  . Drug use: No  . Sexual activity: Not on file  Other Topics Concern  . Not on file  Social History Narrative  .  Not on file   Social Determinants of Health   Financial Resource Strain:   . Difficulty of Paying Living Expenses:   Food Insecurity:   . Worried About Charity fundraiser in the Last Year:   . Arboriculturist in the Last Year:   Transportation Needs:   . Film/video editor (Medical):   Marland Kitchen Lack of Transportation (Non-Medical):   Physical Activity:   . Days of Exercise per Week:   . Minutes of Exercise per Session:   Stress:   . Feeling of Stress :   Social Connections:   . Frequency of Communication with Friends and Family:   . Frequency of Social  Gatherings with Friends and Family:   . Attends Religious Services:   . Active Member of Clubs or Organizations:   . Attends Archivist Meetings:   Marland Kitchen Marital Status:   Intimate Partner Violence:   . Fear of Current or Ex-Partner:   . Emotionally Abused:   Marland Kitchen Physically Abused:   . Sexually Abused:      Review of Systems: General: negative for chills, fever, night sweats or weight changes.  Cardiovascular: negative for chest pain, dyspnea on exertion, edema, orthopnea, palpitations, paroxysmal nocturnal dyspnea or shortness of breath Dermatological: negative for rash Respiratory: negative for cough or wheezing Urologic: negative for hematuria Abdominal: negative for nausea, vomiting, diarrhea, bright red blood per rectum, melena, or hematemesis Neurologic: negative for visual changes, syncope, or dizziness All other systems reviewed and are otherwise negative except as noted above.    Blood pressure (!) 170/78, pulse 73, height 5\' 6"  (1.676 m), weight 183 lb (83 kg), SpO2 98 %.  General appearance: alert and no distress Neck: no adenopathy, no JVD, supple, symmetrical, trachea midline, thyroid not enlarged, symmetric, no tenderness/mass/nodules and Soft left carotid bruit Lungs: clear to auscultation bilaterally Heart: regular rate and rhythm, S1, S2 normal, no murmur, click, rub or gallop Extremities: extremities normal, atraumatic, no cyanosis or edema Pulses: 2+ and symmetric Skin: Skin color, texture, turgor normal. No rashes or lesions Neurologic: Alert and oriented X 3, normal strength and tone. Normal symmetric reflexes. Normal coordination and gait  EKG sinus rhythm at 73 with nonspecific ST and T wave changes.  I personally reviewed this EKG.  ASSESSMENT AND PLAN:   Occlusion and stenosis of carotid artery History of carotid artery disease status post endarterectomy by Dr. [name] on the left side in 2011 with recent right carotid endarterectomy September of last  year for asymptomatic high-grade right ICA stenosis.  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 Roxan Hockey in 2011.  He had a nonischemic Myoview 06/15/2019 for preoperative clearance before elective right carotid endarterectomy 2 months later.  Over last 2 months he is noticed increasing dyspnea on exertion and some atypical chest pain.  I am going to get exercise Myoview a 2D echo to further evaluate.  Essential hypertension, benign History of essential hypertension with blood pressure measured today in the office of 170/78.  He says he checks his blood pressure at home it usually in the 671-245 range systolic.  He is on hydrochlorothiazide, metoprolol and olmesartan.  Hyperlipidemia History of hyperlipidemia on statin therapy followed by his PCP.  Labs performed by his PCP in April revealed a total cholesterol 124 and LDL 75.      [name] MD [facility], [facility] 05/14/2020 10:18 AM

## 2020-05-14 NOTE — Assessment & Plan Note (Signed)
History of carotid artery disease status post endarterectomy by Dr. [name] on the left side in 2011 with recent right carotid endarterectomy September of last year for asymptomatic high-grade right ICA stenosis.

## 2020-05-16 ENCOUNTER — Ambulatory Visit (HOSPITAL_COMMUNITY)
Admission: RE | Admit: 2020-05-16 | Discharge: 2020-05-16 | Disposition: A | Discharge status: Home / Self Care | Payer: Medicare Other | Source: Ambulatory Visit | Attending: Cardiology | Admitting: Cardiology

## 2020-05-16 ENCOUNTER — Other Ambulatory Visit: Payer: Self-pay

## 2020-05-16 DIAGNOSIS — R0602 Shortness of breath: Secondary | ICD-10-CM | POA: Diagnosis not present

## 2020-05-16 DIAGNOSIS — Z951 Presence of aortocoronary bypass graft: Secondary | ICD-10-CM | POA: Diagnosis present

## 2020-05-16 DIAGNOSIS — R0789 Other chest pain: Secondary | ICD-10-CM | POA: Diagnosis not present

## 2020-05-16 DIAGNOSIS — I2510 Atherosclerotic heart disease of native coronary artery without angina pectoris: Secondary | ICD-10-CM | POA: Diagnosis not present

## 2020-05-16 LAB — MYOCARDIAL PERFUSION IMAGING
Peak HR: 127 {beats}/min
Rest HR: 102 {beats}/min

## 2020-05-16 MED ORDER — AMINOPHYLLINE 25 MG/ML IV SOLN
75.0000 mg | Freq: Once | INTRAVENOUS | Status: AC
  Administered 2020-05-16: 75 mg via INTRAVENOUS

## 2020-05-16 MED ORDER — TECHNETIUM TC 99M TETROFOSMIN IV KIT
11.0000 | PACK | Freq: Once | INTRAVENOUS | Status: AC | PRN
  Administered 2020-05-16: 11 via INTRAVENOUS
  Filled 2020-05-16: qty 11

## 2020-05-16 MED ORDER — TECHNETIUM TC 99M TETROFOSMIN IV KIT
28.0000 | PACK | Freq: Once | INTRAVENOUS | Status: AC | PRN
  Administered 2020-05-16: 28 via INTRAVENOUS
  Filled 2020-05-16: qty 28

## 2020-05-16 MED ORDER — REGADENOSON 0.4 MG/5ML IV SOLN
0.4000 mg | Freq: Once | INTRAVENOUS | Status: AC
  Administered 2020-05-16: 0.4 mg via INTRAVENOUS

## 2020-05-20 ENCOUNTER — Telehealth: Payer: Self-pay | Admitting: Cardiovascular Disease

## 2020-05-20 ENCOUNTER — Encounter: Payer: Self-pay | Admitting: Cardiovascular Disease

## 2020-05-20 ENCOUNTER — Other Ambulatory Visit: Payer: Self-pay

## 2020-05-20 ENCOUNTER — Encounter: Payer: Self-pay | Admitting: *Deleted

## 2020-05-20 ENCOUNTER — Ambulatory Visit (INDEPENDENT_AMBULATORY_CARE_PROVIDER_SITE_OTHER): Payer: Medicare Other | Admitting: Cardiovascular Disease

## 2020-05-20 VITALS — BP 169/49 | HR 82 | Ht 67.0 in | Wt 179.6 lb

## 2020-05-20 DIAGNOSIS — R9439 Abnormal result of other cardiovascular function study: Secondary | ICD-10-CM | POA: Diagnosis not present

## 2020-05-20 DIAGNOSIS — Z01818 Encounter for other preprocedural examination: Secondary | ICD-10-CM

## 2020-05-20 DIAGNOSIS — I2510 Atherosclerotic heart disease of native coronary artery without angina pectoris: Secondary | ICD-10-CM

## 2020-05-20 NOTE — Telephone Encounter (Signed)
Returned the call to the patient. He stated that 7/8 works for him to have the left heart cath with Dr. Gwenlyn Found.   Letter has been mailed to the patient. Patient has verbalized his understanding. Labs will be done this week.      [place] [address] [place] [address] Dept: [phone] Loc: [phone]  Thomas Gray  05/20/2020  You are scheduled for a Cardiac Catheterization on Thursday, July 8 with Dr. Quay Burow.  1. Please arrive at the [facility] (Main Entrance A) at [facility]: [address] [place], [place] [address] at 9:30 AM (This time is two hours before your procedure to ensure your preparation). Free valet parking service is available.   Special note: Every effort is made to have your procedure done on time. Please understand that emergencies sometimes delay scheduled procedures.  2. Diet: Do not eat solid foods after midnight.  The patient may have clear liquids until 5am upon the day of the procedure.  3. Labs: You will need to have blood drawn by Monday July 5th.  4. Medication instructions in preparation for your procedure: Hold Metformin the morning of the procedure and then 48 hours after. Hold Hydrochlorothiazide the morning of the procedure.  On the morning of your procedure, take your Aspirin and any morning medicines NOT listed above.  You may use sips of water.  5. Plan for one night stay--bring personal belongings. 6. Bring a current list of your medications and current insurance cards. 7. You MUST have a responsible person to drive you home. 8. Someone MUST be with you the first 24 hours after you arrive home or your discharge will be delayed. 9. Please wear clothes that are easy to get on and off and wear slip-on shoes.  Thank you for allowing [place] to care for you!   -- [facility]

## 2020-05-20 NOTE — Telephone Encounter (Signed)
New message   Per patient he wants to have heart cath procedure done on 05/29/20. Please advise.

## 2020-05-20 NOTE — Patient Instructions (Signed)
Medication Instructions:  NO CHANGE *If you need a refill on your cardiac medications before your next appointment, please call your pharmacy*   Lab Work: If you have labs (blood work) drawn today and your tests are completely normal, you will receive your results only by: Marland Kitchen MyChart Message (if you have MyChart) OR . A paper copy in the mail If you have any lab test that is abnormal or we need to change your treatment, we will call you to review the results.   Testing/Procedures: Your physician has requested that you have a cardiac catheterization. Cardiac catheterization is used to diagnose and/or treat various heart conditions. Doctors may recommend this procedure for a number of different reasons. The most common reason is to evaluate chest pain. Chest pain can be a symptom of coronary artery disease (CAD), and cardiac catheterization can show whether plaque is narrowing or blocking your heart's arteries. This procedure is also used to evaluate the valves, as well as measure the blood flow and oxygen levels in different parts of your heart. For further information please visit [email or web address]. Please follow instruction sheet, as given.     Follow-Up: At [facility], you and your health needs are our priority.  As part of our continuing mission to provide you with exceptional heart care, we have created designated Provider Care Teams.  These Care Teams include your primary Cardiologist (physician) and Advanced Practice Providers (APPs -  Physician Assistants and Nurse Practitioners) who all work together to provide you with the care you need, when you need it.  We recommend signing up for the patient portal called "MyChart".  Sign up information is provided on this After Visit Summary.  MyChart is used to connect with patients for Virtual Visits (Telemedicine).  Patients are able to view lab/test results, encounter notes, upcoming appointments, etc.  Non-urgent messages can be sent to  your provider as well.   To learn more about what you can do with MyChart, go to [email or web address].

## 2020-05-20 NOTE — Progress Notes (Signed)
Mr. Antenucci returns today for follow-up of his Myoview stress test performed 05/16/2020 that showed anteroapical scar new since his previous Myoview performed in July of last year.  He has noticed exertional chest pain or shortness of breath over the last several months.  His bypass grafts are 80 years old.  He will need diagnostic coronary angiography.  He lives alone and his children live out of town.  He prefers to confer with them to find the optimal date in the next couple weeks.  [name], M.D., [place], [facility], [place] [address] [place], [place]  [address]  [phone] 05/20/2020 11:46 AM

## 2020-05-22 LAB — BASIC METABOLIC PANEL
BUN/Creatinine Ratio: 19 (ref 10–24)
BUN: 25 mg/dL (ref 8–27)
CO2: 23 mmol/L (ref 20–29)
Calcium: 9.5 mg/dL (ref 8.6–10.2)
Chloride: 101 mmol/L (ref 96–106)
Creatinine, Ser: 1.31 mg/dL — ABNORMAL HIGH (ref 0.76–1.27)
GFR calc Af Amer: 59 mL/min/{1.73_m2} — ABNORMAL LOW (ref >59)
GFR calc non Af Amer: 51 mL/min/{1.73_m2} — ABNORMAL LOW (ref >59)
Glucose: 206 mg/dL — ABNORMAL HIGH (ref 65–99)
Potassium: 4.7 mmol/L (ref 3.5–5.2)
Sodium: 137 mmol/L (ref 134–144)

## 2020-05-22 LAB — CBC
Hematocrit: 30.7 % — ABNORMAL LOW (ref 37.5–51.0)
Hemoglobin: 9.7 g/dL — ABNORMAL LOW (ref 13.0–17.7)
MCH: 26.9 pg (ref 26.6–33.0)
MCHC: 31.6 g/dL (ref 31.5–35.7)
MCV: 85 fL (ref 79–97)
Platelets: 239 10*3/uL (ref 150–450)
RBC: 3.6 x10E6/uL — ABNORMAL LOW (ref 4.14–5.80)
RDW: 22.1 % — ABNORMAL HIGH (ref 11.6–15.4)
WBC: 7.3 10*3/uL (ref 3.4–10.8)

## 2020-05-26 ENCOUNTER — Inpatient Hospital Stay (HOSPITAL_COMMUNITY): Payer: Medicare Other

## 2020-05-26 ENCOUNTER — Inpatient Hospital Stay (HOSPITAL_COMMUNITY)
Admission: AD | Admit: 2020-05-26 | Discharge: 2020-05-28 | DRG: 287 | Disposition: A | Discharge status: Home / Self Care | Payer: Medicare Other | Source: Other Acute Inpatient Hospital | Attending: Family Medicine | Admitting: Family Medicine

## 2020-05-26 DIAGNOSIS — E1122 Type 2 diabetes mellitus with diabetic chronic kidney disease: Secondary | ICD-10-CM | POA: Diagnosis present

## 2020-05-26 DIAGNOSIS — I2510 Atherosclerotic heart disease of native coronary artery without angina pectoris: Secondary | ICD-10-CM | POA: Diagnosis present

## 2020-05-26 DIAGNOSIS — Z8719 Personal history of other diseases of the digestive system: Secondary | ICD-10-CM | POA: Diagnosis not present

## 2020-05-26 DIAGNOSIS — Z87891 Personal history of nicotine dependence: Secondary | ICD-10-CM

## 2020-05-26 DIAGNOSIS — R0789 Other chest pain: Secondary | ICD-10-CM | POA: Diagnosis not present

## 2020-05-26 DIAGNOSIS — Z951 Presence of aortocoronary bypass graft: Secondary | ICD-10-CM

## 2020-05-26 DIAGNOSIS — Z833 Family history of diabetes mellitus: Secondary | ICD-10-CM | POA: Diagnosis not present

## 2020-05-26 DIAGNOSIS — I129 Hypertensive chronic kidney disease with stage 1 through stage 4 chronic kidney disease, or unspecified chronic kidney disease: Secondary | ICD-10-CM | POA: Diagnosis present

## 2020-05-26 DIAGNOSIS — I2582 Chronic total occlusion of coronary artery: Secondary | ICD-10-CM | POA: Diagnosis present

## 2020-05-26 DIAGNOSIS — I25110 Atherosclerotic heart disease of native coronary artery with unstable angina pectoris: Principal | ICD-10-CM | POA: Diagnosis present

## 2020-05-26 DIAGNOSIS — I361 Nonrheumatic tricuspid (valve) insufficiency: Secondary | ICD-10-CM | POA: Diagnosis not present

## 2020-05-26 DIAGNOSIS — E1165 Type 2 diabetes mellitus with hyperglycemia: Secondary | ICD-10-CM | POA: Diagnosis present

## 2020-05-26 DIAGNOSIS — Z20822 Contact with and (suspected) exposure to covid-19: Secondary | ICD-10-CM | POA: Diagnosis present

## 2020-05-26 DIAGNOSIS — J449 Chronic obstructive pulmonary disease, unspecified: Secondary | ICD-10-CM | POA: Diagnosis present

## 2020-05-26 DIAGNOSIS — L930 Discoid lupus erythematosus: Secondary | ICD-10-CM | POA: Diagnosis present

## 2020-05-26 DIAGNOSIS — E785 Hyperlipidemia, unspecified: Secondary | ICD-10-CM | POA: Diagnosis present

## 2020-05-26 DIAGNOSIS — Z79899 Other long term (current) drug therapy: Secondary | ICD-10-CM

## 2020-05-26 DIAGNOSIS — N1831 Chronic kidney disease, stage 3a: Secondary | ICD-10-CM | POA: Diagnosis present

## 2020-05-26 DIAGNOSIS — I25119 Atherosclerotic heart disease of native coronary artery with unspecified angina pectoris: Secondary | ICD-10-CM | POA: Diagnosis not present

## 2020-05-26 DIAGNOSIS — I208 Other forms of angina pectoris: Secondary | ICD-10-CM | POA: Diagnosis not present

## 2020-05-26 DIAGNOSIS — Z8249 Family history of ischemic heart disease and other diseases of the circulatory system: Secondary | ICD-10-CM | POA: Diagnosis not present

## 2020-05-26 DIAGNOSIS — E118 Type 2 diabetes mellitus with unspecified complications: Secondary | ICD-10-CM | POA: Diagnosis not present

## 2020-05-26 DIAGNOSIS — N183 Chronic kidney disease, stage 3 unspecified: Secondary | ICD-10-CM | POA: Diagnosis not present

## 2020-05-26 DIAGNOSIS — E1169 Type 2 diabetes mellitus with other specified complication: Secondary | ICD-10-CM | POA: Diagnosis not present

## 2020-05-26 DIAGNOSIS — Z7982 Long term (current) use of aspirin: Secondary | ICD-10-CM | POA: Diagnosis not present

## 2020-05-26 DIAGNOSIS — I25709 Atherosclerosis of coronary artery bypass graft(s), unspecified, with unspecified angina pectoris: Secondary | ICD-10-CM | POA: Diagnosis not present

## 2020-05-26 DIAGNOSIS — J441 Chronic obstructive pulmonary disease with (acute) exacerbation: Secondary | ICD-10-CM

## 2020-05-26 DIAGNOSIS — D638 Anemia in other chronic diseases classified elsewhere: Secondary | ICD-10-CM

## 2020-05-26 DIAGNOSIS — I10 Essential (primary) hypertension: Secondary | ICD-10-CM | POA: Diagnosis not present

## 2020-05-26 DIAGNOSIS — D649 Anemia, unspecified: Secondary | ICD-10-CM | POA: Diagnosis not present

## 2020-05-26 DIAGNOSIS — R079 Chest pain, unspecified: Secondary | ICD-10-CM | POA: Diagnosis present

## 2020-05-26 DIAGNOSIS — I200 Unstable angina: Secondary | ICD-10-CM | POA: Diagnosis not present

## 2020-05-26 DIAGNOSIS — D631 Anemia in chronic kidney disease: Secondary | ICD-10-CM | POA: Diagnosis present

## 2020-05-26 LAB — CBC
HCT: 30.5 % — ABNORMAL LOW (ref 39.0–52.0)
Hemoglobin: 9.5 g/dL — ABNORMAL LOW (ref 13.0–17.0)
MCH: 27 pg (ref 26.0–34.0)
MCHC: 31.1 g/dL (ref 30.0–36.0)
MCV: 86.6 fL (ref 80.0–100.0)
Platelets: 271 10*3/uL (ref 150–400)
RBC: 3.52 MIL/uL — ABNORMAL LOW (ref 4.22–5.81)
RDW: 23 % — ABNORMAL HIGH (ref 11.5–15.5)
WBC: 7.7 10*3/uL (ref 4.0–10.5)
nRBC: 0.4 % — ABNORMAL HIGH (ref 0.0–0.2)

## 2020-05-26 LAB — HEMOGLOBIN A1C
Hgb A1c MFr Bld: 8.3 % — ABNORMAL HIGH (ref 4.8–5.6)
Mean Plasma Glucose: 191.51 mg/dL

## 2020-05-26 LAB — BASIC METABOLIC PANEL
Anion gap: 10 (ref 5–15)
BUN: 27 mg/dL — ABNORMAL HIGH (ref 8–23)
CO2: 26 mmol/L (ref 22–32)
Calcium: 9.2 mg/dL (ref 8.9–10.3)
Chloride: 103 mmol/L (ref 98–111)
Creatinine, Ser: 1.41 mg/dL — ABNORMAL HIGH (ref 0.61–1.24)
GFR calc Af Amer: 55 mL/min — ABNORMAL LOW (ref >60)
GFR calc non Af Amer: 47 mL/min — ABNORMAL LOW (ref >60)
Glucose, Bld: 176 mg/dL — ABNORMAL HIGH (ref 70–99)
Potassium: 4.6 mmol/L (ref 3.5–5.1)
Sodium: 139 mmol/L (ref 135–145)

## 2020-05-26 LAB — GLUCOSE, CAPILLARY
Glucose-Capillary: 171 mg/dL — ABNORMAL HIGH (ref 70–99)
Glucose-Capillary: 156 mg/dL — ABNORMAL HIGH (ref 70–99)
Glucose-Capillary: 202 mg/dL — ABNORMAL HIGH (ref 70–99)

## 2020-05-26 LAB — ECHOCARDIOGRAM COMPLETE

## 2020-05-26 LAB — MRSA PCR SCREENING: MRSA by PCR: NEGATIVE

## 2020-05-26 MED ORDER — ATORVASTATIN CALCIUM 10 MG PO TABS
20.0000 mg | ORAL_TABLET | Freq: Every evening | ORAL | Status: DC
  Administered 2020-05-26 – 2020-05-27 (×2): 20 mg via ORAL
  Filled 2020-05-26 (×2): qty 2

## 2020-05-26 MED ORDER — ACETAMINOPHEN 325 MG PO TABS: 650.0000 mg | ORAL_TABLET | ORAL | Status: DC | PRN

## 2020-05-26 MED ORDER — IRBESARTAN 150 MG PO TABS
150.0000 mg | ORAL_TABLET | Freq: Every day | ORAL | Status: DC
  Administered 2020-05-26: 150 mg via ORAL
  Filled 2020-05-26: qty 1

## 2020-05-26 MED ORDER — SODIUM CHLORIDE 0.9 % WEIGHT BASED INFUSION
3.0000 mL/kg/h | INTRAVENOUS | Status: AC
  Administered 2020-05-27: 3 mL/kg/h via INTRAVENOUS

## 2020-05-26 MED ORDER — INSULIN ASPART 100 UNIT/ML ~~LOC~~ SOLN
0.0000 [IU] | Freq: Three times a day (TID) | SUBCUTANEOUS | Status: DC
  Administered 2020-05-26 – 2020-05-27 (×3): 2 [IU] via SUBCUTANEOUS
  Administered 2020-05-27 – 2020-05-28 (×2): 1 [IU] via SUBCUTANEOUS

## 2020-05-26 MED ORDER — SODIUM CHLORIDE 0.9% FLUSH
3.0000 mL | Freq: Two times a day (BID) | INTRAVENOUS | Status: DC
  Administered 2020-05-26 – 2020-05-27 (×3): 3 mL via INTRAVENOUS

## 2020-05-26 MED ORDER — SODIUM CHLORIDE 0.9 % IV SOLN: 250.0000 mL | INTRAVENOUS | Status: DC | PRN

## 2020-05-26 MED ORDER — ENOXAPARIN SODIUM 40 MG/0.4ML ~~LOC~~ SOLN
40.0000 mg | SUBCUTANEOUS | Status: DC
  Administered 2020-05-26: 40 mg via SUBCUTANEOUS
  Filled 2020-05-26: qty 0.4

## 2020-05-26 MED ORDER — PANTOPRAZOLE SODIUM 40 MG PO TBEC
40.0000 mg | DELAYED_RELEASE_TABLET | Freq: Every day | ORAL | Status: DC
  Administered 2020-05-26 – 2020-05-28 (×3): 40 mg via ORAL
  Filled 2020-05-26 (×3): qty 1

## 2020-05-26 MED ORDER — ASPIRIN 81 MG PO CHEW
81.0000 mg | CHEWABLE_TABLET | ORAL | Status: AC
  Administered 2020-05-27: 81 mg via ORAL
  Filled 2020-05-26: qty 1

## 2020-05-26 MED ORDER — ALUM & MAG HYDROXIDE-SIMETH 200-200-20 MG/5ML PO SUSP: 30.0000 mL | Freq: Four times a day (QID) | ORAL | Status: DC | PRN

## 2020-05-26 MED ORDER — AMLODIPINE BESYLATE 5 MG PO TABS
5.0000 mg | ORAL_TABLET | Freq: Every day | ORAL | Status: DC
  Administered 2020-05-26 – 2020-05-28 (×3): 5 mg via ORAL
  Filled 2020-05-26 (×3): qty 1

## 2020-05-26 MED ORDER — PHILLIPS COLON HEALTH PO CAPS: 1.0000 | ORAL_CAPSULE | Freq: Every day | ORAL | Status: DC

## 2020-05-26 MED ORDER — SODIUM CHLORIDE 0.9 % WEIGHT BASED INFUSION
1.0000 mL/kg/h | INTRAVENOUS | Status: DC
  Administered 2020-05-27: 1 mL/kg/h via INTRAVENOUS

## 2020-05-26 MED ORDER — SODIUM CHLORIDE 0.9% FLUSH: 3.0000 mL | INTRAVENOUS | Status: DC | PRN

## 2020-05-26 MED ORDER — ONDANSETRON HCL 4 MG/2ML IJ SOLN: 4.0000 mg | Freq: Four times a day (QID) | INTRAMUSCULAR | Status: DC | PRN

## 2020-05-26 MED ORDER — METOPROLOL TARTRATE 50 MG PO TABS
50.0000 mg | ORAL_TABLET | Freq: Two times a day (BID) | ORAL | Status: DC
  Administered 2020-05-26 – 2020-05-28 (×5): 50 mg via ORAL
  Filled 2020-05-26 (×5): qty 1

## 2020-05-26 MED ORDER — HYDROCHLOROTHIAZIDE 25 MG PO TABS
12.5000 mg | ORAL_TABLET | Freq: Every day | ORAL | Status: DC
  Administered 2020-05-26: 12.5 mg via ORAL
  Filled 2020-05-26: qty 1

## 2020-05-26 NOTE — Consult Note (Signed)
Cardiology Consultation:   Patient ID: Thomas HAEGELE MRN: [id number]; DOB: 1941-10-26  Admit date: 05/26/2020 Date of Consult: 05/26/2020  Primary Care Provider: Algis Greenhouse, MD [facility] Cardiologist: Dr Quay Burow [facility] Electrophysiologist:  None    Patient Profile:   Thomas Gray is a 79 y.o. male with a hx of CAD with prior CABG who is being seen today for the evaluation of chest pain at the request of Dr Tamala Julian.  History of Present Illness:   Thomas Gray 79 yo male history of tobacco use, CAD with prior CABG, HTN, HL, history of bilateral CEAs  Seen by Dr Gwenlyn Found as outpatient 05/14/20 with reported DOE and chest pain.  04/2020 nuclear stress: anterior infarct, no current ischemia.ST depressions with lexiscan Of note 05/2019 nuclear stress reported to have been normal.  Given the new anterior defect and progressing exertional symptoms Dr Gwenlyn Found had planned for outpatient cath 05/29/20.   Presented to [facility] with chest pain. He reports over the last few weeks mid to right sided chest pain. Dull pain sometimes pressure. Has noted with exertion recently. Typically walks 30 min on treadmill without troubles. Recently has noted exertional chest pains on treadmill 8/10 with increased DOE, resolves with rest and also NG. He had an episode while in bed the day of presentation, it was not more intense than prior but given his stress test findings he became very worried and presented to [facility] Hgb 9.8 Plt 262 WBC 7.3 BUN 34 Cr 1.4 K 4.5 BNP 336 (WNL) INR 1.1   Trop 0.02--> COVID neg EKG SR, nonspecific ST/T changes CXR pulm edema   Past Medical History:  Diagnosis Date  . Anemia   . CAD (coronary artery disease)    12/29/2011 normal R/S nuclear study;08/05/2010 cath smooth 50% ostial left main 60% distal left main, 60% ca+ smooth prox LAD, 70% ramus intermed., 90% prox. near ostial, 80% prox. CX, diffuse 30%prox. RCA, 95% eccentric stenosis  mid RCA 5o0-90%distal RCA; 50% distal aortic stenosis  . 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 COPD (chronic obstructive pulmonary disease) ([place])   . Diabetes mellitus without complication ([place])   . H/O discoid lupus erythematosus   . H/O discoid lupus erythematosus   . Heart murmur    Echo:  Mitral regurg. EF 55-65%  . Hyperlipidemia   . Hypertension   . 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 Early and Roxan Hockey    Past Surgical History:  Procedure Laterality Date  . CAROTID ENDARTERECTOMY  08/07/2010  . CHOLECYSTECTOMY  1991   gall bladder  . CORONARY ARTERY BYPASS GRAFT  08/07/2010  . ENDARTERECTOMY Right 08/03/2019   Procedure: ENDARTERECTOMY RIGHT CAROTID;  Surgeon: Rosetta Posner, MD;  Location: [place];  Service: Vascular;  Laterality: Right;  . PATCH ANGIOPLASTY Right 08/03/2019   Procedure: Patch Angioplasty using Hemashield Platinum Finesse.;  Surgeon: Rosetta Posner, MD;  Location: [place];  Service: Vascular;  Laterality: Right;  . TONSILLECTOMY  1976  . TOOTH EXTRACTION  Jan. and Aug.  2015      Inpatient Medications: Scheduled Meds: . enoxaparin (LOVENOX) injection  40 mg Subcutaneous Q24H   Continuous Infusions:  PRN Meds: acetaminophen, alum & mag hydroxide-simeth, ondansetron (ZOFRAN) IV  Allergies:   No Known Allergies  Social History:   Social History   Socioeconomic History  . Marital status: Single    Spouse name: Not  on file  . Number of children: Not on file  . Years of education: Not on file  . Highest education level: Not on file  Occupational History  . Not on file  Tobacco Use  . Smoking status: Former Smoker    Packs/day: 1.00    Types: Cigarettes    Quit date: 08/07/2010    Years since quitting: 9.8  . Smokeless tobacco: Never Used  Vaping Use  . Vaping Use: Never used  Substance and Sexual Activity  . Alcohol use: Yes    Comment: 1 glass of wine weekly  . Drug use: No  . Sexual  activity: Not on file  Other Topics Concern  . Not on file  Social History Narrative  . Not on file   Social Determinants of Health   Financial Resource Strain:   . Difficulty of Paying Living Expenses:   Food Insecurity:   . Worried About Charity fundraiser in the Last Year:   . Arboriculturist in the Last Year:   Transportation Needs:   . Film/video editor (Medical):   Marland Kitchen Lack of Transportation (Non-Medical):   Physical Activity:   . Days of Exercise per Week:   . Minutes of Exercise per Session:   Stress:   . Feeling of Stress :   Social Connections:   . Frequency of Communication with Friends and Family:   . Frequency of Social Gatherings with Friends and Family:   . Attends Religious Services:   . Active Member of Clubs or Organizations:   . Attends Archivist Meetings:   Marland Kitchen Marital Status:   Intimate Partner Violence:   . Fear of Current or Ex-Partner:   . Emotionally Abused:   Marland Kitchen Physically Abused:   . Sexually Abused:     Family History:    Family History  Problem Relation Age of Onset  . Hypertension Mother   . Diabetes Mother   . Heart disease Mother        After age 39     ROS:  Please see the history of present illness.   All other ROS reviewed and negative.     Physical Exam/Data:   Vitals:   05/26/20 0928  BP: (!) 174/65  Pulse: 79  Resp: 20  Temp: 97.7 F (36.5 C)  TempSrc: Oral  SpO2: 98%   No intake or output data in the 24 hours ending 05/26/20 1041 Last 3 Weights 05/20/2020 05/16/2020 05/14/2020  Weight (lbs) 179 lb 9.6 oz 183 lb 183 lb  Weight (kg) 81.466 kg 83.008 kg 83.008 kg     There is no height or weight on file to calculate BMI.  General:  Well nourished, well developed, in no acute distress HEENT: normal Lymph: no adenopathy Neck: no JVD Endocrine:  No thryomegaly Vascular: No carotid bruits; FA pulses 2+ bilaterally without bruits  Cardiac:  normal S1, S2; RRR; no murmur  Lungs:  clear to auscultation  bilaterally, no wheezing, rhonchi or rales  Abd: soft, nontender, no hepatomegaly  Ext: 1-2+bilateral LE edema Musculoskeletal:  No deformities, BUE and BLE strength normal and equal Skin: warm and dry  Neuro:  CNs 2-12 intact, no focal abnormalities noted Psych:  Normal affect     Laboratory Data:  High Sensitivity Troponin:  No results for input(s): TROPONINIHS in the last 720 hours.   Chemistry Recent Labs  Lab 05/21/20 1133  NA 137  K 4.7  CL 101  CO2 23  GLUCOSE 206*  BUN 25  CREATININE 1.31*  CALCIUM 9.5  GFRNONAA 51*  GFRAA 59*    No results for input(s): PROT, ALBUMIN, AST, ALT, ALKPHOS, BILITOT in the last 168 hours. Hematology Recent Labs  Lab 05/21/20 1133  WBC 7.3  RBC 3.60*  HGB 9.7*  HCT 30.7*  MCV 85  MCH 26.9  MCHC 31.6  RDW 22.1*  PLT 239   BNPNo results for input(s): BNP, PROBNP in the last 168 hours.  DDimer No results for input(s): DDIMER in the last 168 hours.   Radiology/Studies:  No results found. {   Assessment and Plan:   1. CAD/Chest pain - history of prior CABG - presents with exertional chest pain and DOE - outpatient stress test without ischemia, but did have a new anterior infarct compared to nuclear stress just 1 year ago - Dr Gwenlyn Found had planned outpatient cath later this week, patient presented to [facility] with ongoing symptoms - symptoms concerning for exertoinal angina. Has not had objective evidence for ACS by EKG or enzymes - will change his outpatient cath to inpatient for tomorrow - obtain echo - continue home medical therapy  2. HTN - high bp's. Start norvasc 5mg  daily - note he has some chronic LE edema at baseline  3. LE edema - he reports this is chronic - normal BNP at [facility], CXR reported some pulm edema. Does not appear volume ovelroaded by exam - f/u LVEDP from cath, would not diurese at this time, particularly with his Cr prior to cath     For questions or updates, please contact [place] Please consult [email or web address] for contact info under    Signed, Carlyle Dolly, MD  05/26/2020 10:41 AM

## 2020-05-26 NOTE — Progress Notes (Signed)
RN notified MD patient arrived on the unit awaiting orders.

## 2020-05-26 NOTE — Progress Notes (Signed)
Echocardiogram 2D Echocardiogram has been performed.  [place] Deputy Annasofia Pohl 05/26/2020, 1:25 PM

## 2020-05-26 NOTE — H&P (Signed)
History and Physical    Thomas Gray TOI:[id number] DOB: 09/07/1941 DOA: 05/26/2020  Referring MD/NP/PA: Shela Leff, MD PCP: Algis Greenhouse, MD  Consultants: Dr. Barry-cardiology Patient coming from: [facility] transfer  Chief Complaint: Chest pain I have personally briefly reviewed patient's old medical records in [place]   HPI: Thomas Gray is a 79 y.o. male with medical history significant of hypertension, hyperlipidemia, CAD s/p CABG, COPD, and diabetes mellitus type II who presented to [facility] with complaints of chest pain that he believes woke him out of sleep this morning around 2:30 AM.  Complains of having right substernal chest pain that radiates toward the middle and in between his shoulder pain needs.  Noted associated symptoms of diaphoresis and shortness of breath with exertion.  Denies having any nausea, vomiting or lightheadedness feelings.  Symptoms self resolved over a couple of hours, but previously in the past that resolved with rest and or nitroglycerin.Marland Kitchen  He had just recently had a stress test due to his symptoms of exertional dyspnea and chest pain with Dr. Alvester Chou his cardiologist on 6/25 which was noted to be of intermittent risk.  He followed up with Dr. Alvester Chou in his office and was scheduled to have a follow-up heart catheterization on 7/8.  At [facility] patient was noted to have stable vital signs.  Labs appeared near patient's baseline with hemoglobin 9.8, BUN 34, creatinine 1.4, BNP 336, and troponin negative.  Chest x-ray concern for possible pulmonary edema. EKG revealed sinus rhythm with nonspecific ST wave changes.  Transfer was requested due to need of further evaluation with left heart cath.  ED Course: as seen above  Review of Systems  Constitutional: Positive for diaphoresis. Negative for chills and fever.  HENT: Negative for nosebleeds and sinus pain.   Eyes: Negative for pain and discharge.  Respiratory: Positive for shortness of  breath. Negative for hemoptysis.   Cardiovascular: Positive for chest pain and leg swelling.  Gastrointestinal: Negative for nausea and vomiting.  Genitourinary: Negative for dysuria and hematuria.  Musculoskeletal: Negative for falls and joint pain.  Neurological: Negative for dizziness, focal weakness and loss of consciousness.  Psychiatric/Behavioral: Negative for memory loss and substance abuse. The patient has insomnia.     Past Medical History:  Diagnosis Date  . Anemia   . CAD (coronary artery disease)    12/29/2011 normal R/S nuclear study;08/05/2010 cath smooth 50% ostial left main 60% distal left main, 60% ca+ smooth prox LAD, 70% ramus intermed., 90% prox. near ostial, 80% prox. CX, diffuse 30%prox. RCA, 95% eccentric stenosis mid RCA 5o0-90%distal RCA; 50% distal aortic stenosis  . 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 COPD (chronic obstructive pulmonary disease) ([place])   . Diabetes mellitus without complication ([place])   . H/O discoid lupus erythematosus   . H/O discoid lupus erythematosus   . Heart murmur    Echo:  Mitral regurg. EF 55-65%  . Hyperlipidemia   . Hypertension   . 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 Early and Roxan Hockey    Past Surgical History:  Procedure Laterality Date  . CAROTID ENDARTERECTOMY  08/07/2010  . CHOLECYSTECTOMY  1991   gall bladder  . CORONARY ARTERY BYPASS GRAFT  08/07/2010  . ENDARTERECTOMY Right 08/03/2019   Procedure: ENDARTERECTOMY RIGHT CAROTID;  Surgeon: Rosetta Posner, MD;  Location: [facility];  Service: Vascular;  Laterality: Right;  . PATCH ANGIOPLASTY Right 08/03/2019   Procedure: Patch  Angioplasty using Hemashield Platinum Finesse.;  Surgeon: Rosetta Posner, MD;  Location: [place];  Service: Vascular;  Laterality: Right;  . TONSILLECTOMY  1976  . TOOTH EXTRACTION  Jan. and Aug.  2015     reports that he quit smoking about 9 years ago. His smoking use included cigarettes. He smoked 1.00  pack per day. He has never used smokeless tobacco. He reports current alcohol use. He reports that he does not use drugs.  No Known Allergies  Family History  Problem Relation Age of Onset  . Hypertension Mother   . Diabetes Mother   . Heart disease Mother        After age 66    Prior to Admission medications   Medication Sig Start Date End Date Taking? Authorizing Provider  aspirin EC 81 MG tablet Take 81 mg by mouth daily.      [provider]  atorvastatin (LIPITOR) 20 MG tablet TAKE 1 TABLET BY MOUTH EVERY DAY Patient taking differently: Take 20 mg by mouth daily.  04/18/20   [name], MD  cetirizine (ZYRTEC) 10 MG tablet Take 10 mg by mouth daily as needed for allergies or rhinitis.    [provider]  hydrochlorothiazide (HYDRODIURIL) 25 MG tablet TAKE 1 TABLET BY MOUTH EVERY DAY Patient taking differently: Take 25 mg by mouth daily.  05/05/20   [name], MD  metFORMIN (GLUCOPHAGE) 500 MG tablet Take 500 mg by mouth 2 (two) times daily with a meal.  01/09/18   [provider]  metoprolol tartrate (LOPRESSOR) 50 MG tablet Take 1 tablet (50 mg total) by mouth 2 (two) times daily. 04/28/20   [name], MD  olmesartan (BENICAR) 20 MG tablet TAKE 1 TABLET BY MOUTH EVERY DAY Patient taking differently: Take 20 mg by mouth daily.  05/05/20   [name], MD  omeprazole (PRILOSEC) 40 MG capsule Take 1 capsule (40 mg total) by mouth daily. KEEP OV. 05/12/20   [name], MD  Probiotic Product ([place]) CAPS Take 1 capsule by mouth daily.    [provider]    Physical Exam:  Constitutional: Elderly male who appears to be in NAD, calm, comfortable Vitals:   05/26/20 0928  BP: (!) 174/65  Pulse: 79  Resp: 20  Temp: 97.7 F (36.5 C)  TempSrc: Oral  SpO2: 98%   Eyes: PERRL, lids and conjunctivae normal ENMT: Mucous membranes are moist. Posterior pharynx clear of any exudate or lesions. Neck: normal,  supple, no masses, no thyromegaly.  No JVD Respiratory: Patient with mild crackles noted in the lower lung fields.  O2 saturations appear to be maintained on room air. Cardiovascular: Regular rate and rhythm with 1+ pitting edema noted on the right lower extremity and 2+ pitting edema on the left lower extremity. Abdomen: no tenderness, no masses palpated. No hepatosplenomegaly. Bowel sounds positive.  Musculoskeletal: no clubbing / cyanosis. No joint deformity upper and lower extremities. Good ROM, no contractures. Normal muscle tone.  Skin: no rashes, lesions, ulcers. No induration Neurologic: CN 2-12 grossly intact. Sensation intact, DTR normal. Strength 5/5 in all 4.  Psychiatric: Normal judgment and insight. Alert and oriented x 3. Normal mood.     Labs on Admission: I have personally reviewed following labs and imaging studies  CBC: Recent Labs  Lab 05/21/20 1133  WBC 7.3  HGB 9.7*  HCT 30.7*  MCV 85  PLT 419   Basic Metabolic Panel: Recent Labs  Lab 05/21/20  1133  NA 137  K 4.7  CL 101  CO2 23  GLUCOSE 206*  BUN 25  CREATININE 1.31*  CALCIUM 9.5   GFR: Estimated Creatinine Clearance: 46.8 mL/min (A) (by C-G formula based on SCr of 1.31 mg/dL (H)). Liver Function Tests: No results for input(s): AST, ALT, ALKPHOS, BILITOT, PROT, ALBUMIN in the last 168 hours. No results for input(s): LIPASE, AMYLASE in the last 168 hours. No results for input(s): AMMONIA in the last 168 hours. Coagulation Profile: No results for input(s): INR, PROTIME in the last 168 hours. Cardiac Enzymes: No results for input(s): CKTOTAL, CKMB, CKMBINDEX, TROPONINI in the last 168 hours. BNP (last 3 results) No results for input(s): PROBNP in the last 8760 hours. HbA1C: No results for input(s): HGBA1C in the last 72 hours. CBG: No results for input(s): GLUCAP in the last 168 hours. Lipid Profile: No results for input(s): CHOL, HDL, LDLCALC, TRIG, CHOLHDL, LDLDIRECT in the last 72  hours. Thyroid Function Tests: No results for input(s): TSH, T4TOTAL, FREET4, T3FREE, THYROIDAB in the last 72 hours. Anemia Panel: No results for input(s): VITAMINB12, FOLATE, FERRITIN, TIBC, IRON, RETICCTPCT in the last 72 hours. Urine analysis:    Component Value Date/Time   COLORURINE YELLOW 08/03/2019 0626   APPEARANCEUR CLEAR 08/03/2019 0626   LABSPEC 1.018 08/03/2019 0626   PHURINE 5.0 08/03/2019 0626   GLUCOSEU NEGATIVE 08/03/2019 0626   HGBUR SMALL (A) 08/03/2019 0626   BILIRUBINUR NEGATIVE 08/03/2019 0626   KETONESUR NEGATIVE 08/03/2019 0626   PROTEINUR 100 (A) 08/03/2019 0626   UROBILINOGEN 1.0 08/07/2010 0024   NITRITE NEGATIVE 08/03/2019 0626   LEUKOCYTESUR NEGATIVE 08/03/2019 0626   Sepsis Labs: No results found for this or any previous visit (from the past 240 hour(s)).   Radiological Exams on Admission: No results found.  EKG: Independently reviewed.  Normal sinus rhythm at 65 bpm  Assessment/Plan Chest pain, CAD: Patient with prior history of CABG back in 2011, but reports complaints of chest pain over the last couple months.  Cardiac enzymes at the outpatient facility and EKG were noted to be unchanged.  Patient had recent intermittent risk stress study on 6/25 with Dr. Gwenlyn Found and had plan to have left heart cath on 7/8. -Admit to a cardiac telemetry bed -N.p.o. after midnight for heart cath planned in a.m. -Check repeat echocardiogram per cardiology -[place] cardiology consultative services, we will follow-up for any further recommendations  COPD, without acute exacerbation: Patient with previous history of tobacco abuse, but quit in 2011.  O2 saturations currently maintained on room air.  Appears not to be wheezing on physical exam.  Does not appear to be on any inhalers at home. -Breathing treatments as needed  Essential hypertension: Home blood pressure medications include metoprolol 50 mg twice daily, olmesartan 20 mg daily, and hydrochlorothiazide 12.5  mg daily. -Continue current regimen with pharmacy substitution's  Diabetes mellitus type 2: Last hemoglobin A1c noted to be 8.4 on 9/82020.  Home medications include Metformin. -Hypoglycemic protocols -Hold Metformin -CBGs before every meal with sensitive SSI  Anemia of chronic kidney disease: At outside facility patient creatinine was noted to be 9.5 which appears relatively unchanged.  Patient denied any reports of bleeding. -Continue to monitor  Chronic kidney disease stage IIIa: Creatinine 1.4 which appears near his baseline. -Continue to monitor  Hyperlipidemia -Continue atorvastatin  DVT prophylaxis: Lovenox Code Status: Full Family Communication: No family requested be updated at this time Disposition Plan: Possible discharge home once medically stable after full work-up of chest pain  Consults called: Cardiology Admission status: Inpatient  Norval Morton MD [facility] Pager [phone]   If 7PM-7AM, please contact night-coverage [email or web address] Password [email or web address]  05/26/2020, 10:02 AM

## 2020-05-26 NOTE — Plan of Care (Signed)

## 2020-05-27 ENCOUNTER — Encounter (HOSPITAL_COMMUNITY)
Admission: AD | Disposition: A | Discharge status: Home / Self Care | Payer: Self-pay | Source: Other Acute Inpatient Hospital | Attending: Internal Medicine

## 2020-05-27 ENCOUNTER — Encounter (HOSPITAL_COMMUNITY): Payer: Self-pay | Admitting: Interventional Cardiology

## 2020-05-27 DIAGNOSIS — I25119 Atherosclerotic heart disease of native coronary artery with unspecified angina pectoris: Secondary | ICD-10-CM

## 2020-05-27 DIAGNOSIS — E1122 Type 2 diabetes mellitus with diabetic chronic kidney disease: Secondary | ICD-10-CM

## 2020-05-27 DIAGNOSIS — I25709 Atherosclerosis of coronary artery bypass graft(s), unspecified, with unspecified angina pectoris: Secondary | ICD-10-CM

## 2020-05-27 DIAGNOSIS — N183 Chronic kidney disease, stage 3 unspecified: Secondary | ICD-10-CM

## 2020-05-27 DIAGNOSIS — E118 Type 2 diabetes mellitus with unspecified complications: Secondary | ICD-10-CM

## 2020-05-27 DIAGNOSIS — I200 Unstable angina: Secondary | ICD-10-CM

## 2020-05-27 HISTORY — PX: LEFT HEART CATH AND CORS/GRAFTS ANGIOGRAPHY: CATH118250

## 2020-05-27 LAB — LIPID PANEL
Cholesterol: 131 mg/dL (ref 0–200)
HDL: 28 mg/dL — ABNORMAL LOW (ref >40)
LDL Cholesterol: 74 mg/dL (ref 0–99)
Total CHOL/HDL Ratio: 4.7 RATIO
Triglycerides: 143 mg/dL (ref <150)
VLDL: 29 mg/dL (ref 0–40)

## 2020-05-27 LAB — BASIC METABOLIC PANEL
Anion gap: 8 (ref 5–15)
BUN: 27 mg/dL — ABNORMAL HIGH (ref 8–23)
CO2: 26 mmol/L (ref 22–32)
Calcium: 8.6 mg/dL — ABNORMAL LOW (ref 8.9–10.3)
Chloride: 102 mmol/L (ref 98–111)
Creatinine, Ser: 1.35 mg/dL — ABNORMAL HIGH (ref 0.61–1.24)
GFR calc Af Amer: 57 mL/min — ABNORMAL LOW (ref >60)
GFR calc non Af Amer: 50 mL/min — ABNORMAL LOW (ref >60)
Glucose, Bld: 150 mg/dL — ABNORMAL HIGH (ref 70–99)
Potassium: 4.3 mmol/L (ref 3.5–5.1)
Sodium: 136 mmol/L (ref 135–145)

## 2020-05-27 LAB — GLUCOSE, CAPILLARY
Glucose-Capillary: 140 mg/dL — ABNORMAL HIGH (ref 70–99)
Glucose-Capillary: 149 mg/dL — ABNORMAL HIGH (ref 70–99)
Glucose-Capillary: 146 mg/dL — ABNORMAL HIGH (ref 70–99)
Glucose-Capillary: 197 mg/dL — ABNORMAL HIGH (ref 70–99)
Glucose-Capillary: 158 mg/dL — ABNORMAL HIGH (ref 70–99)

## 2020-05-27 LAB — CBC
HCT: 26.9 % — ABNORMAL LOW (ref 39.0–52.0)
Hemoglobin: 8.2 g/dL — ABNORMAL LOW (ref 13.0–17.0)
MCH: 25.9 pg — ABNORMAL LOW (ref 26.0–34.0)
MCHC: 30.5 g/dL (ref 30.0–36.0)
MCV: 85.1 fL (ref 80.0–100.0)
Platelets: 229 10*3/uL (ref 150–400)
RBC: 3.16 MIL/uL — ABNORMAL LOW (ref 4.22–5.81)
RDW: 22.7 % — ABNORMAL HIGH (ref 11.5–15.5)
WBC: 6.1 10*3/uL (ref 4.0–10.5)
nRBC: 0.5 % — ABNORMAL HIGH (ref 0.0–0.2)

## 2020-05-27 LAB — TROPONIN I (HIGH SENSITIVITY)
Troponin I (High Sensitivity): 171 ng/L (ref <18)
Troponin I (High Sensitivity): 134 ng/L (ref <18)

## 2020-05-27 SURGERY — LEFT HEART CATH AND CORS/GRAFTS ANGIOGRAPHY
Anesthesia: LOCAL

## 2020-05-27 MED ORDER — OXYCODONE HCL 5 MG PO TABS: 5.0000 mg | ORAL_TABLET | ORAL | Status: DC | PRN

## 2020-05-27 MED ORDER — LABETALOL HCL 5 MG/ML IV SOLN: 10.0000 mg | INTRAVENOUS | Status: AC | PRN

## 2020-05-27 MED ORDER — LIDOCAINE HCL (PF) 1 % IJ SOLN
INTRAMUSCULAR | Status: DC | PRN
  Administered 2020-05-27: 20 mL

## 2020-05-27 MED ORDER — SODIUM CHLORIDE 0.9 % IV SOLN: INTRAVENOUS | Status: AC

## 2020-05-27 MED ORDER — HYDRALAZINE HCL 20 MG/ML IJ SOLN: 10.0000 mg | INTRAMUSCULAR | Status: AC | PRN

## 2020-05-27 MED ORDER — HEPARIN (PORCINE) IN NACL 1000-0.9 UT/500ML-% IV SOLN
INTRAVENOUS | Status: AC
  Filled 2020-05-27: qty 1000

## 2020-05-27 MED ORDER — IOHEXOL 350 MG/ML SOLN
INTRAVENOUS | Status: DC | PRN
  Administered 2020-05-27: 105 mL

## 2020-05-27 MED ORDER — MIDAZOLAM HCL 2 MG/2ML IJ SOLN
INTRAMUSCULAR | Status: AC
  Filled 2020-05-27: qty 2

## 2020-05-27 MED ORDER — IOHEXOL 350 MG/ML SOLN
INTRAVENOUS | Status: DC | PRN
  Administered 2020-05-27: 105 mL via INTRA_ARTERIAL

## 2020-05-27 MED ORDER — IOHEXOL 350 MG/ML SOLN
INTRAVENOUS | Status: AC
  Filled 2020-05-27: qty 1

## 2020-05-27 MED ORDER — ALBUTEROL SULFATE (2.5 MG/3ML) 0.083% IN NEBU: 2.5000 mg | INHALATION_SOLUTION | Freq: Four times a day (QID) | RESPIRATORY_TRACT | Status: DC | PRN

## 2020-05-27 MED ORDER — HEPARIN SODIUM (PORCINE) 5000 UNIT/ML IJ SOLN
5000.0000 [IU] | Freq: Three times a day (TID) | INTRAMUSCULAR | Status: DC
  Administered 2020-05-27 – 2020-05-28 (×2): 5000 [IU] via SUBCUTANEOUS
  Filled 2020-05-27 (×2): qty 1

## 2020-05-27 MED ORDER — ACETAMINOPHEN 325 MG PO TABS: 650.0000 mg | ORAL_TABLET | ORAL | Status: DC | PRN

## 2020-05-27 MED ORDER — HYDRALAZINE HCL 20 MG/ML IJ SOLN: 10.0000 mg | Freq: Three times a day (TID) | INTRAMUSCULAR | Status: DC | PRN

## 2020-05-27 MED ORDER — LIDOCAINE HCL (PF) 1 % IJ SOLN
INTRAMUSCULAR | Status: AC
  Filled 2020-05-27: qty 30

## 2020-05-27 MED ORDER — SODIUM CHLORIDE 0.9% FLUSH: 3.0000 mL | INTRAVENOUS | Status: DC | PRN

## 2020-05-27 MED ORDER — LABETALOL HCL 5 MG/ML IV SOLN
INTRAVENOUS | Status: DC | PRN
  Administered 2020-05-27 (×2): 10 mg via INTRAVENOUS

## 2020-05-27 MED ORDER — SODIUM CHLORIDE 0.9 % IV SOLN: 250.0000 mL | INTRAVENOUS | Status: DC | PRN

## 2020-05-27 MED ORDER — FENTANYL CITRATE (PF) 100 MCG/2ML IJ SOLN
INTRAMUSCULAR | Status: DC | PRN
  Administered 2020-05-27: 25 ug via INTRAVENOUS

## 2020-05-27 MED ORDER — LABETALOL HCL 5 MG/ML IV SOLN
INTRAVENOUS | Status: AC
  Filled 2020-05-27: qty 4

## 2020-05-27 MED ORDER — ASPIRIN 81 MG PO CHEW
81.0000 mg | CHEWABLE_TABLET | Freq: Every day | ORAL | Status: DC
  Administered 2020-05-28: 81 mg via ORAL
  Filled 2020-05-27: qty 1

## 2020-05-27 MED ORDER — MIDAZOLAM HCL 2 MG/2ML IJ SOLN
INTRAMUSCULAR | Status: DC | PRN
  Administered 2020-05-27: 1 mg via INTRAVENOUS

## 2020-05-27 MED ORDER — ONDANSETRON HCL 4 MG/2ML IJ SOLN: 4.0000 mg | Freq: Four times a day (QID) | INTRAMUSCULAR | Status: DC | PRN

## 2020-05-27 MED ORDER — SODIUM CHLORIDE 0.9% FLUSH: 3.0000 mL | Freq: Two times a day (BID) | INTRAVENOUS | Status: DC

## 2020-05-27 MED ORDER — FENTANYL CITRATE (PF) 100 MCG/2ML IJ SOLN
INTRAMUSCULAR | Status: AC
  Filled 2020-05-27: qty 2

## 2020-05-27 MED ORDER — HEPARIN (PORCINE) IN NACL 1000-0.9 UT/500ML-% IV SOLN
INTRAVENOUS | Status: DC | PRN
  Administered 2020-05-27 (×2): 500 mL

## 2020-05-27 SURGICAL SUPPLY — 8 items
CATH INFINITI 5 FR IM (CATHETERS) ×1 IMPLANT
CATH INFINITI 5FR JL4 (CATHETERS) ×1 IMPLANT
CATH INFINITI 5FR MPB2 (CATHETERS) ×1 IMPLANT
KIT HEART LEFT (KITS) ×2 IMPLANT
PACK CARDIAC CATHETERIZATION (CUSTOM PROCEDURE TRAY) ×2 IMPLANT
SHEATH PINNACLE 5F 10CM (SHEATH) ×1 IMPLANT
TRANSDUCER W/STOPCOCK (MISCELLANEOUS) ×2 IMPLANT
WIRE EMERALD 3MM-J .035X150CM (WIRE) ×1 IMPLANT

## 2020-05-27 NOTE — H&P (View-Only) (Signed)
Progress Note  Patient Name: Thomas Gray Date of Encounter: 05/27/2020  [facility] Cardiologist: Quay Burow MD  Subjective   No chest pain or dyspnea last night or this am.   Inpatient Medications    Scheduled Meds: . amLODipine  5 mg Oral Daily  . atorvastatin  20 mg Oral QPM  . enoxaparin (LOVENOX) injection  40 mg Subcutaneous Q24H  . insulin aspart  0-9 Units Subcutaneous TID WC  . metoprolol tartrate  50 mg Oral BID  . pantoprazole  40 mg Oral Daily  . sodium chloride flush  3 mL Intravenous Q12H   Continuous Infusions: . sodium chloride    . sodium chloride 1 mL/kg/hr (05/27/20 0505)   PRN Meds: sodium chloride, acetaminophen, albuterol, alum & mag hydroxide-simeth, ondansetron (ZOFRAN) IV, sodium chloride flush   Vital Signs    Vitals:   05/26/20 2113 05/26/20 2347 05/27/20 0346 05/27/20 0500  BP: (!) 147/47 (!) 152/63 (!) 124/41   Pulse: 70 63 69   Resp:  11 17   Temp:  97.8 F (36.6 C) 97.7 F (36.5 C)   TempSrc:  Oral Oral   SpO2:  100% 97%   Weight:    80.9 kg  Height:        Intake/Output Summary (Last 24 hours) at 05/27/2020 0736 Last data filed at 05/27/2020 0600 Gross per 24 hour  Intake 449.61 ml  Output 275 ml  Net 174.61 ml   Last 3 Weights 05/27/2020 05/26/2020 05/20/2020  Weight (lbs) 178 lb 5.6 oz 178 lb 1.6 oz 179 lb 9.6 oz  Weight (kg) 80.9 kg 80.786 kg 81.466 kg      Telemetry    NSR - Personally Reviewed  ECG    NSR with normal Ecg - Personally Reviewed  Physical Exam   GEN: No acute distress.   Neck: No JVD, bilateral CEA scars Cardiac: RRR, no murmurs, rubs, or gallops. Absent left radial pulse. Femoral pulses 2+ Respiratory: Clear to auscultation bilaterally. GI: Soft, nontender, non-distended  MS: No edema; No deformity. Neuro:  Nonfocal  Psych: Normal affect   Labs    High Sensitivity Troponin:  No results for input(s): TROPONINIHS in the last 720 hours.    Chemistry Recent Labs  Lab 05/21/20 1133  05/26/20 1547 05/27/20 0229  NA 137 139 136  K 4.7 4.6 4.3  CL 101 103 102  CO2 23 26 26   GLUCOSE 206* 176* 150*  BUN 25 27* 27*  CREATININE 1.31* 1.41* 1.35*  CALCIUM 9.5 9.2 8.6*  GFRNONAA 51* 47* 50*  GFRAA 59* 55* 71*  ANIONGAP  --  10 8     Hematology Recent Labs  Lab 05/21/20 1133 05/26/20 1547 05/27/20 0229  WBC 7.3 7.7 6.1  RBC 3.60* 3.52* 3.16*  HGB 9.7* 9.5* 8.2*  HCT 30.7* 30.5* 26.9*  MCV 85 86.6 85.1  MCH 26.9 27.0 25.9*  MCHC 31.6 31.1 30.5  RDW 22.1* 23.0* 22.7*  PLT 239 271 229    BNPNo results for input(s): BNP, PROBNP in the last 168 hours.   DDimer No results for input(s): DDIMER in the last 168 hours.   Radiology    ECHOCARDIOGRAM COMPLETE  Result Date: 05/26/2020    ECHOCARDIOGRAM REPORT   Patient Name:   Thomas Gray  Date of Exam: 05/26/2020 Medical Rec #:  [id number]  Height:       67.0 in Accession #:    [id number] Weight:       179.6 lb Date of  Birth:  1941/01/22  BSA:          1.932 m Patient Age:    79 years   BP:           140/61 mmHg Patient Gender: M          HR:           76 bpm. Exam Location:  Inpatient Procedure: 2D Echo, Color Doppler and Cardiac Doppler Indications:    R07.9* Chest pain, unspecified  History:        Patient has prior history of Echocardiogram examinations, most                 recent 12/18/2012. Prior CABG, COPD; Risk Factors:Hypertension,                 Diabetes and Dyslipidemia.  Sonographer:    Raquel Sarna Senior RDCS Referring Phys: [id number] [place]  1. Left ventricular ejection fraction, by estimation, is 65 to 70%. The left ventricle has normal function. The left ventricle has no regional wall motion abnormalities. There is moderate asymmetric left ventricular hypertrophy of the septal segment. Left ventricular diastolic parameters are indeterminate.  2. Right ventricular systolic function is normal. The right ventricular size is normal. There is mildly elevated pulmonary artery systolic pressure. The  estimated right ventricular systolic pressure is 74.0 mmHg.  3. Left atrial size was moderately dilated.  4. The mitral valve is grossly normal. Trivial mitral valve regurgitation.  5. The aortic valve is tricuspid. Aortic valve regurgitation is not visualized. Mild aortic valve sclerosis is present, with no evidence of aortic valve stenosis.  6. The inferior vena cava is normal in size with greater than 50% respiratory variability, suggesting right atrial pressure of 3 mmHg. FINDINGS  Left Ventricle: Left ventricular ejection fraction, by estimation, is 65 to 70%. The left ventricle has normal function. The left ventricle has no regional wall motion abnormalities. The left ventricular internal cavity size was normal in size. There is  moderate asymmetric left ventricular hypertrophy of the septal segment. Left ventricular diastolic parameters are indeterminate. Right Ventricle: The right ventricular size is normal. No increase in right ventricular wall thickness. Right ventricular systolic function is normal. There is mildly elevated pulmonary artery systolic pressure. The tricuspid regurgitant velocity is 3.05  m/s, and with an assumed right atrial pressure of 3 mmHg, the estimated right ventricular systolic pressure is 81.4 mmHg. Left Atrium: Left atrial size was moderately dilated. Right Atrium: Right atrial size was normal in size. Pericardium: There is no evidence of pericardial effusion. Mitral Valve: The mitral valve is grossly normal. Trivial mitral valve regurgitation. Tricuspid Valve: The tricuspid valve is grossly normal. Tricuspid valve regurgitation is mild. Aortic Valve: The aortic valve is tricuspid. Aortic valve regurgitation is not visualized. Mild aortic valve sclerosis is present, with no evidence of aortic valve stenosis. Pulmonic Valve: The pulmonic valve was grossly normal. Pulmonic valve regurgitation is trivial. Aorta: The aortic root is normal in size and structure. Venous: The inferior  vena cava is normal in size with greater than 50% respiratory variability, suggesting right atrial pressure of 3 mmHg. IAS/Shunts: No atrial level shunt detected by color flow Doppler.  LEFT VENTRICLE PLAX 2D LVIDd:         3.40 cm  Diastology LVIDs:         2.10 cm  LV e' lateral:   7.51 cm/s LV PW:         1.00 cm  LV E/e' lateral:  14.1 LV IVS:        1.40 cm  LV e' medial:    6.85 cm/s LVOT diam:     1.90 cm  LV E/e' medial:  15.5 LV SV:         67 LV SV Index:   35 LVOT Area:     2.84 cm  RIGHT VENTRICLE RV S prime:     9.57 cm/s TAPSE (M-mode): 1.8 cm LEFT ATRIUM             Index       RIGHT ATRIUM           Index LA diam:        4.30 cm 2.23 cm/m  RA Area:     17.70 cm LA Vol (A2C):   55.8 ml 28.88 ml/m RA Volume:   43.50 ml  22.52 ml/m LA Vol (A4C):   86.1 ml 44.57 ml/m LA Biplane Vol: 74.6 ml 38.62 ml/m  AORTIC VALVE LVOT Vmax:   107.00 cm/s LVOT Vmean:  75.100 cm/s LVOT VTI:    0.238 m  AORTA Ao Root diam: 3.20 cm Ao Asc diam:  3.50 cm MITRAL VALVE                TRICUSPID VALVE MV Area (PHT): 3.72 cm     TR Peak grad:   37.2 mmHg MV Decel Time: 204 msec     TR Vmax:        305.00 cm/s MV E velocity: 106.00 cm/s MV A velocity: 88.60 cm/s   SHUNTS MV E/A ratio:  1.20         Systemic VTI:  0.24 m                             Systemic Diam: 1.90 cm Rozann Lesches MD Electronically signed by Rozann Lesches MD Signature Date/Time: 05/26/2020/5:21:45 PM    Final     Cardiac Studies   Myoview 05/16/20: anteroseptal infarct. New since 2020  Patient Profile     79 y.o. male a hx of CAD with prior CABG who is being seen today for the evaluation of chest pain at the request of Dr Tamala Julian.  Assessment & Plan    1. Unstable angina. Cardiac enzymes are negative. Ecg is normal. Recent nuclear stress test reportedly showed a new anteroseptal perfusion abnormality compared to 2020. Unable to pull up images. Echo shows normal LV function. Patient is s/p CABG by Dr Roxan Hockey in 2011 with LIMA to the LAD,  SVG to ramus, and sequential SVG to PDA and PL. Patient on ASA, statin, metoprolol and amlodipine. Plan cardiac cath today. Patient has absent left radial pulse (reports he had an arterial line placed there when he had his left carotid endarterectomy) will need femoral access. The procedure and risks were reviewed including but not limited to death, myocardial infarction, stroke, arrythmias, bleeding, transfusion, emergency surgery, dye allergy, or renal dysfunction. The patient voices understanding and is agreeable to proceed..   2. HTN. BP improved with addition of amlodipine.   3. Chronic LE edema. Assess LV filling pressures today.  4. DM poorly controlled. A1c 8.3%. per primary care  5. Hyperlipidemia. On lipitor 20 mg. Will check lipid panel.  6. CKD stage 3a- hydrating for cath  7. Anemia. Patient reports history of iron deficiency anemia with remote Mallory Weiss tear. No recent bleeding. Anemia panel pending. May need to resume iron therapy. Heme check stool.  8. Carotid arterial disease. S/p bilateral CEAs.       For questions or updates, please contact [place] Please consult [email or web address] for contact info under        Signed, Jolee Critcher [place], MD  05/27/2020, 7:36 AM

## 2020-05-27 NOTE — Interval H&P Note (Signed)
Cath Lab Visit (complete for each Cath Lab visit)  Clinical Evaluation Leading to the Procedure:   ACS: Yes.    Non-ACS:    Anginal Classification: CCS III  Anti-ischemic medical therapy: Minimal Therapy (1 class of medications)  Non-Invasive Test Results: No non-invasive testing performed  Prior CABG: Previous CABG      History and Physical Interval Note:  05/27/2020 10:23 AM  Thomas Gray  has presented today for surgery, with the diagnosis of unstable angina.  The various methods of treatment have been discussed with the patient and family. After consideration of risks, benefits and other options for treatment, the patient has consented to  Procedure(s): LEFT HEART CATH AND CORS/GRAFTS ANGIOGRAPHY (N/A) as a surgical intervention.  The patient's history has been reviewed, patient examined, no change in status, stable for surgery.  I have reviewed the patient's chart and labs.  Questions were answered to the patient's satisfaction.     Belva Crome III

## 2020-05-27 NOTE — Progress Notes (Signed)
CRITICAL VALUE ALERT  Critical Value:  Troponin 171  Date & Time Notied:  05/27/20 at 0937  Provider Notified: Dr. Frederic Jericho  Orders Received/Actions taken:

## 2020-05-27 NOTE — Progress Notes (Signed)
75fr sheath aspirated and removed from rfa. Manual pressure applied for 20 minutes. Site level 0 no s+s of hematoma. Tegaderm dressing applied, bedrest instructions given.  Bilateral dp and pt pulses palpable.  Bedrest begins at 12:23:00

## 2020-05-27 NOTE — Progress Notes (Addendum)
PROGRESS NOTE    Thomas Gray  FBP:[id number] DOB: May 24, 1941 DOA: 05/26/2020 PCP: Algis Greenhouse, MD   Brief Narrative: 79 year old with past medical history significant for CAD with prior CABG, hypertension, hyperlipidemia, COPD, diabetes type 2 who presented to [facility] complaining of chest pain.  Episode lasted for a couple of hours, resolved on its own.  He has been having intermittent chest pain, for which he underwent stress test by his cardiologist.  Stress test results was intermediate risk plan was to proceed with heart cath on 7/8.  Now that  patient presented with recurrent chest pain plan is to proceed with inpatient heart cath. EKG with nonspecific ST changes, troponin outside facility negative.    Assessment & Plan:   Principal Problem:   Chest pain Active Problems:   CAD, multiple vessel   S/P CABG x 4   COPD  1-Chest pain, angina, CAD history of CABG Patient has abnormal intermediate risk stress test Echocardiogram ejection fraction 65 to 70% no wall motion abnormality. Cycle cardiac enzymes.  Plan for cath today:   2-Diabetes type 2: Hemoglobin A1c 8.3 Continue with a sliding scale insulin.  3-Hypertension: Hold hydrochlorothiazide and Avapro in anticipation of cath. Continue with Norvasc PRN hydralazine.   4-History of COPD: Nebulizer as needed.  5-anemia of chronic disease: Check anemia panel.  CKD stage IIIa: Creatinine baseline 1.4  Hyperlipidemia: Continue with atorvastatin.  Estimated body mass index is 28.79 kg/m as calculated from the following:   Height as of this encounter: 5\' 6"  (1.676 m).   Weight as of this encounter: 80.9 kg.   DVT prophylaxis: Lovenox Code Status: Full code Family Communication: care discussed with patient  Disposition Plan:  Status is: Inpatient  Remains inpatient appropriate because:Ongoing active pain requiring inpatient pain management   Dispo: The patient is from: Home              Anticipated  d/c is to: Home              Anticipated d/c date is: 1 day              Patient currently is not medically stable to d/c.  Patient will undergo cardiac cath.  Need to monitor renal function post cath        Consultants:   Cardiology  Procedures:   Echocardiogram:Left ventricular ejection fraction, by estimation, is 65 to 70%. The  left ventricle has normal function. The left ventricle has no regional  wall motion abnormalities. There is moderate asymmetric left ventricular  hypertrophy of the septal segment.  Left ventricular diastolic parameters are indeterminate.  2. Right ventricular systolic function is normal. The right ventricular  size is normal. There is mildly elevated pulmonary artery systolic  pressure. The estimated right ventricular systolic pressure is 82.4 mmHg.  3. Left atrial size was moderately dilated.  4. The mitral valve is grossly normal. Trivial mitral valve  regurgitation.  5. The aortic valve is tricuspid. Aortic valve regurgitation is not  visualized. Mild aortic valve sclerosis is present, with no evidence of  aortic valve stenosis.  6. The inferior vena cava is normal in size with greater than 50%  respiratory variability, suggesting right atrial pressure of 3 mmHg.   Antimicrobials:  None  Subjective: Chest pain free.  Denies dyspna, abdominal pain   Objective: Vitals:   05/26/20 2113 05/26/20 2347 05/27/20 0346 05/27/20 0500  BP: (!) 147/47 (!) 152/63 (!) 124/41   Pulse: 70 63 69  Resp:  11 17   Temp:  97.8 F (36.6 C) 97.7 F (36.5 C)   TempSrc:  Oral Oral   SpO2:  100% 97%   Weight:    80.9 kg  Height:        Intake/Output Summary (Last 24 hours) at 05/27/2020 0710 Last data filed at 05/27/2020 0600 Gross per 24 hour  Intake 449.61 ml  Output 275 ml  Net 174.61 ml   Filed Weights   05/26/20 0928 05/27/20 0500  Weight: 80.8 kg 80.9 kg    Examination:  General exam: Appears calm and comfortable  Respiratory system:  Clear to auscultation. Respiratory effort normal. Cardiovascular system: S1 & S2 heard, RRR. No JVD, murmurs, rubs, gallops or clicks. No pedal edema. Gastrointestinal system: Abdomen is nondistended, soft and nontender. No organomegaly or masses felt. Normal bowel sounds heard. Central nervous system: Alert and oriented. No focal neurological deficits. Extremities: Symmetric 5 x 5 power. Skin: No rashes, lesions or ulcers Psychiatry: Judgement and insight appear normal. Mood & affect appropriate.     Data Reviewed: I have personally reviewed following labs and imaging studies  CBC: Recent Labs  Lab 05/21/20 1133 05/26/20 1547 05/27/20 0229  WBC 7.3 7.7 6.1  HGB 9.7* 9.5* 8.2*  HCT 30.7* 30.5* 26.9*  MCV 85 86.6 85.1  PLT 239 271 676   Basic Metabolic Panel: Recent Labs  Lab 05/21/20 1133 05/26/20 1547 05/27/20 0229  NA 137 139 136  K 4.7 4.6 4.3  CL 101 103 102  CO2 23 26 26   GLUCOSE 206* 176* 150*  BUN 25 27* 27*  CREATININE 1.31* 1.41* 1.35*  CALCIUM 9.5 9.2 8.6*   GFR: Estimated Creatinine Clearance: 44.3 mL/min (A) (by C-G formula based on SCr of 1.35 mg/dL (H)). Liver Function Tests: No results for input(s): AST, ALT, ALKPHOS, BILITOT, PROT, ALBUMIN in the last 168 hours. No results for input(s): LIPASE, AMYLASE in the last 168 hours. No results for input(s): AMMONIA in the last 168 hours. Coagulation Profile: No results for input(s): INR, PROTIME in the last 168 hours. Cardiac Enzymes: No results for input(s): CKTOTAL, CKMB, CKMBINDEX, TROPONINI in the last 168 hours. BNP (last 3 results) No results for input(s): PROBNP in the last 8760 hours. HbA1C: Recent Labs    05/26/20 1547  HGBA1C 8.3*   CBG: Recent Labs  Lab 05/26/20 1205 05/26/20 1538 05/26/20 2057 05/27/20 0603  GLUCAP 171* 156* 202* 140*   Lipid Profile: No results for input(s): CHOL, HDL, LDLCALC, TRIG, CHOLHDL, LDLDIRECT in the last 72 hours. Thyroid Function Tests: No results  for input(s): TSH, T4TOTAL, FREET4, T3FREE, THYROIDAB in the last 72 hours. Anemia Panel: No results for input(s): VITAMINB12, FOLATE, FERRITIN, TIBC, IRON, RETICCTPCT in the last 72 hours. Sepsis Labs: No results for input(s): PROCALCITON, LATICACIDVEN in the last 168 hours.  Recent Results (from the past 240 hour(s))  MRSA PCR Screening     Status: None   Collection Time: 05/26/20  9:27 AM   Specimen: Nasal Mucosa; Nasopharyngeal  Result Value Ref Range Status   MRSA by PCR NEGATIVE NEGATIVE Final    Comment:        The GeneXpert MRSA Assay (FDA approved for NASAL specimens only), is one component of a comprehensive MRSA colonization surveillance program. It is not intended to diagnose MRSA infection nor to guide or monitor treatment for MRSA infections. Performed at [facility], [place] [address] [place], [place] [address]  Radiology Studies: ECHOCARDIOGRAM COMPLETE  Result Date: 05/26/2020    ECHOCARDIOGRAM REPORT   Patient Name:   Thomas Gray  Date of Exam: 05/26/2020 Medical Rec #:  [id number]  Height:       67.0 in Accession #:    [id number] Weight:       179.6 lb Date of Birth:  1941-05-29  BSA:          1.932 m Patient Age:    61 years   BP:           140/61 mmHg Patient Gender: M          HR:           76 bpm. Exam Location:  Inpatient Procedure: 2D Echo, Color Doppler and Cardiac Doppler Indications:    R07.9* Chest pain, unspecified  History:        Patient has prior history of Echocardiogram examinations, most                 recent 12/18/2012. Prior CABG, COPD; Risk Factors:Hypertension,                 Diabetes and Dyslipidemia.  Sonographer:    Raquel Sarna Senior RDCS Referring Phys: [id number] [place]  1. Left ventricular ejection fraction, by estimation, is 65 to 70%. The left ventricle has normal function. The left ventricle has no regional wall motion abnormalities. There is moderate asymmetric left ventricular hypertrophy of the septal segment.  Left ventricular diastolic parameters are indeterminate.  2. Right ventricular systolic function is normal. The right ventricular size is normal. There is mildly elevated pulmonary artery systolic pressure. The estimated right ventricular systolic pressure is 23.7 mmHg.  3. Left atrial size was moderately dilated.  4. The mitral valve is grossly normal. Trivial mitral valve regurgitation.  5. The aortic valve is tricuspid. Aortic valve regurgitation is not visualized. Mild aortic valve sclerosis is present, with no evidence of aortic valve stenosis.  6. The inferior vena cava is normal in size with greater than 50% respiratory variability, suggesting right atrial pressure of 3 mmHg. FINDINGS  Left Ventricle: Left ventricular ejection fraction, by estimation, is 65 to 70%. The left ventricle has normal function. The left ventricle has no regional wall motion abnormalities. The left ventricular internal cavity size was normal in size. There is  moderate asymmetric left ventricular hypertrophy of the septal segment. Left ventricular diastolic parameters are indeterminate. Right Ventricle: The right ventricular size is normal. No increase in right ventricular wall thickness. Right ventricular systolic function is normal. There is mildly elevated pulmonary artery systolic pressure. The tricuspid regurgitant velocity is 3.05  m/s, and with an assumed right atrial pressure of 3 mmHg, the estimated right ventricular systolic pressure is 62.8 mmHg. Left Atrium: Left atrial size was moderately dilated. Right Atrium: Right atrial size was normal in size. Pericardium: There is no evidence of pericardial effusion. Mitral Valve: The mitral valve is grossly normal. Trivial mitral valve regurgitation. Tricuspid Valve: The tricuspid valve is grossly normal. Tricuspid valve regurgitation is mild. Aortic Valve: The aortic valve is tricuspid. Aortic valve regurgitation is not visualized. Mild aortic valve sclerosis is present, with no  evidence of aortic valve stenosis. Pulmonic Valve: The pulmonic valve was grossly normal. Pulmonic valve regurgitation is trivial. Aorta: The aortic root is normal in size and structure. Venous: The inferior vena cava is normal in size with greater than 50% respiratory variability, suggesting right atrial pressure of 3 mmHg. IAS/Shunts: No atrial level shunt  detected by color flow Doppler.  LEFT VENTRICLE PLAX 2D LVIDd:         3.40 cm  Diastology LVIDs:         2.10 cm  LV e' lateral:   7.51 cm/s LV PW:         1.00 cm  LV E/e' lateral: 14.1 LV IVS:        1.40 cm  LV e' medial:    6.85 cm/s LVOT diam:     1.90 cm  LV E/e' medial:  15.5 LV SV:         67 LV SV Index:   35 LVOT Area:     2.84 cm  RIGHT VENTRICLE RV S prime:     9.57 cm/s TAPSE (M-mode): 1.8 cm LEFT ATRIUM             Index       RIGHT ATRIUM           Index LA diam:        4.30 cm 2.23 cm/m  RA Area:     17.70 cm LA Vol (A2C):   55.8 ml 28.88 ml/m RA Volume:   43.50 ml  22.52 ml/m LA Vol (A4C):   86.1 ml 44.57 ml/m LA Biplane Vol: 74.6 ml 38.62 ml/m  AORTIC VALVE LVOT Vmax:   107.00 cm/s LVOT Vmean:  75.100 cm/s LVOT VTI:    0.238 m  AORTA Ao Root diam: 3.20 cm Ao Asc diam:  3.50 cm MITRAL VALVE                TRICUSPID VALVE MV Area (PHT): 3.72 cm     TR Peak grad:   37.2 mmHg MV Decel Time: 204 msec     TR Vmax:        305.00 cm/s MV E velocity: 106.00 cm/s MV A velocity: 88.60 cm/s   SHUNTS MV E/A ratio:  1.20         Systemic VTI:  0.24 m                             Systemic Diam: 1.90 cm Rozann Lesches MD Electronically signed by Rozann Lesches MD Signature Date/Time: 05/26/2020/5:21:45 PM    Final         Scheduled Meds: . amLODipine  5 mg Oral Daily  . atorvastatin  20 mg Oral QPM  . enoxaparin (LOVENOX) injection  40 mg Subcutaneous Q24H  . hydrochlorothiazide  12.5 mg Oral Daily  . insulin aspart  0-9 Units Subcutaneous TID WC  . irbesartan  150 mg Oral Daily  . metoprolol tartrate  50 mg Oral BID  . pantoprazole  40  mg Oral Daily  . sodium chloride flush  3 mL Intravenous Q12H   Continuous Infusions: . sodium chloride    . sodium chloride 1 mL/kg/hr (05/27/20 0505)     LOS: 1 day    Time spent: 35 minutes.     Elmarie Shiley, MD [facility]   If 7PM-7AM, please contact night-coverage [email or web address]  05/27/2020, 7:10 AM

## 2020-05-27 NOTE — Progress Notes (Signed)
Progress Note  Patient Name: Thomas Gray Date of Encounter: 05/27/2020  [facility] Cardiologist: Quay Burow MD  Subjective   No chest pain or dyspnea last night or this am.   Inpatient Medications    Scheduled Meds: . amLODipine  5 mg Oral Daily  . atorvastatin  20 mg Oral QPM  . enoxaparin (LOVENOX) injection  40 mg Subcutaneous Q24H  . insulin aspart  0-9 Units Subcutaneous TID WC  . metoprolol tartrate  50 mg Oral BID  . pantoprazole  40 mg Oral Daily  . sodium chloride flush  3 mL Intravenous Q12H   Continuous Infusions: . sodium chloride    . sodium chloride 1 mL/kg/hr (05/27/20 0505)   PRN Meds: sodium chloride, acetaminophen, albuterol, alum & mag hydroxide-simeth, ondansetron (ZOFRAN) IV, sodium chloride flush   Vital Signs    Vitals:   05/26/20 2113 05/26/20 2347 05/27/20 0346 05/27/20 0500  BP: (!) 147/47 (!) 152/63 (!) 124/41   Pulse: 70 63 69   Resp:  11 17   Temp:  97.8 F (36.6 C) 97.7 F (36.5 C)   TempSrc:  Oral Oral   SpO2:  100% 97%   Weight:    80.9 kg  Height:        Intake/Output Summary (Last 24 hours) at 05/27/2020 0736 Last data filed at 05/27/2020 0600 Gross per 24 hour  Intake 449.61 ml  Output 275 ml  Net 174.61 ml   Last 3 Weights 05/27/2020 05/26/2020 05/20/2020  Weight (lbs) 178 lb 5.6 oz 178 lb 1.6 oz 179 lb 9.6 oz  Weight (kg) 80.9 kg 80.786 kg 81.466 kg      Telemetry    NSR - Personally Reviewed  ECG    NSR with normal Ecg - Personally Reviewed  Physical Exam   GEN: No acute distress.   Neck: No JVD, bilateral CEA scars Cardiac: RRR, no murmurs, rubs, or gallops. Absent left radial pulse. Femoral pulses 2+ Respiratory: Clear to auscultation bilaterally. GI: Soft, nontender, non-distended  MS: No edema; No deformity. Neuro:  Nonfocal  Psych: Normal affect   Labs    High Sensitivity Troponin:  No results for input(s): TROPONINIHS in the last 720 hours.    Chemistry Recent Labs  Lab 05/21/20 1133  05/26/20 1547 05/27/20 0229  NA 137 139 136  K 4.7 4.6 4.3  CL 101 103 102  CO2 23 26 26   GLUCOSE 206* 176* 150*  BUN 25 27* 27*  CREATININE 1.31* 1.41* 1.35*  CALCIUM 9.5 9.2 8.6*  GFRNONAA 51* 47* 50*  GFRAA 59* 55* 37*  ANIONGAP  --  10 8     Hematology Recent Labs  Lab 05/21/20 1133 05/26/20 1547 05/27/20 0229  WBC 7.3 7.7 6.1  RBC 3.60* 3.52* 3.16*  HGB 9.7* 9.5* 8.2*  HCT 30.7* 30.5* 26.9*  MCV 85 86.6 85.1  MCH 26.9 27.0 25.9*  MCHC 31.6 31.1 30.5  RDW 22.1* 23.0* 22.7*  PLT 239 271 229    BNPNo results for input(s): BNP, PROBNP in the last 168 hours.   DDimer No results for input(s): DDIMER in the last 168 hours.   Radiology    ECHOCARDIOGRAM COMPLETE  Result Date: 05/26/2020    ECHOCARDIOGRAM REPORT   Patient Name:   Thomas Gray  Date of Exam: 05/26/2020 Medical Rec #:  [id number]  Height:       67.0 in Accession #:    [id number] Weight:       179.6 lb Date of  Birth:  1941-06-27  BSA:          1.932 m Patient Age:    79 years   BP:           140/61 mmHg Patient Gender: M          HR:           76 bpm. Exam Location:  Inpatient Procedure: 2D Echo, Color Doppler and Cardiac Doppler Indications:    R07.9* Chest pain, unspecified  History:        Patient has prior history of Echocardiogram examinations, most                 recent 12/18/2012. Prior CABG, COPD; Risk Factors:Hypertension,                 Diabetes and Dyslipidemia.  Sonographer:    Raquel Sarna Senior RDCS Referring Phys: [id number] [place]  1. Left ventricular ejection fraction, by estimation, is 65 to 70%. The left ventricle has normal function. The left ventricle has no regional wall motion abnormalities. There is moderate asymmetric left ventricular hypertrophy of the septal segment. Left ventricular diastolic parameters are indeterminate.  2. Right ventricular systolic function is normal. The right ventricular size is normal. There is mildly elevated pulmonary artery systolic pressure. The  estimated right ventricular systolic pressure is 06.2 mmHg.  3. Left atrial size was moderately dilated.  4. The mitral valve is grossly normal. Trivial mitral valve regurgitation.  5. The aortic valve is tricuspid. Aortic valve regurgitation is not visualized. Mild aortic valve sclerosis is present, with no evidence of aortic valve stenosis.  6. The inferior vena cava is normal in size with greater than 50% respiratory variability, suggesting right atrial pressure of 3 mmHg. FINDINGS  Left Ventricle: Left ventricular ejection fraction, by estimation, is 65 to 70%. The left ventricle has normal function. The left ventricle has no regional wall motion abnormalities. The left ventricular internal cavity size was normal in size. There is  moderate asymmetric left ventricular hypertrophy of the septal segment. Left ventricular diastolic parameters are indeterminate. Right Ventricle: The right ventricular size is normal. No increase in right ventricular wall thickness. Right ventricular systolic function is normal. There is mildly elevated pulmonary artery systolic pressure. The tricuspid regurgitant velocity is 3.05  m/s, and with an assumed right atrial pressure of 3 mmHg, the estimated right ventricular systolic pressure is 69.4 mmHg. Left Atrium: Left atrial size was moderately dilated. Right Atrium: Right atrial size was normal in size. Pericardium: There is no evidence of pericardial effusion. Mitral Valve: The mitral valve is grossly normal. Trivial mitral valve regurgitation. Tricuspid Valve: The tricuspid valve is grossly normal. Tricuspid valve regurgitation is mild. Aortic Valve: The aortic valve is tricuspid. Aortic valve regurgitation is not visualized. Mild aortic valve sclerosis is present, with no evidence of aortic valve stenosis. Pulmonic Valve: The pulmonic valve was grossly normal. Pulmonic valve regurgitation is trivial. Aorta: The aortic root is normal in size and structure. Venous: The inferior  vena cava is normal in size with greater than 50% respiratory variability, suggesting right atrial pressure of 3 mmHg. IAS/Shunts: No atrial level shunt detected by color flow Doppler.  LEFT VENTRICLE PLAX 2D LVIDd:         3.40 cm  Diastology LVIDs:         2.10 cm  LV e' lateral:   7.51 cm/s LV PW:         1.00 cm  LV E/e' lateral:  14.1 LV IVS:        1.40 cm  LV e' medial:    6.85 cm/s LVOT diam:     1.90 cm  LV E/e' medial:  15.5 LV SV:         67 LV SV Index:   35 LVOT Area:     2.84 cm  RIGHT VENTRICLE RV S prime:     9.57 cm/s TAPSE (M-mode): 1.8 cm LEFT ATRIUM             Index       RIGHT ATRIUM           Index LA diam:        4.30 cm 2.23 cm/m  RA Area:     17.70 cm LA Vol (A2C):   55.8 ml 28.88 ml/m RA Volume:   43.50 ml  22.52 ml/m LA Vol (A4C):   86.1 ml 44.57 ml/m LA Biplane Vol: 74.6 ml 38.62 ml/m  AORTIC VALVE LVOT Vmax:   107.00 cm/s LVOT Vmean:  75.100 cm/s LVOT VTI:    0.238 m  AORTA Ao Root diam: 3.20 cm Ao Asc diam:  3.50 cm MITRAL VALVE                TRICUSPID VALVE MV Area (PHT): 3.72 cm     TR Peak grad:   37.2 mmHg MV Decel Time: 204 msec     TR Vmax:        305.00 cm/s MV E velocity: 106.00 cm/s MV A velocity: 88.60 cm/s   SHUNTS MV E/A ratio:  1.20         Systemic VTI:  0.24 m                             Systemic Diam: 1.90 cm Rozann Lesches MD Electronically signed by Rozann Lesches MD Signature Date/Time: 05/26/2020/5:21:45 PM    Final     Cardiac Studies   Myoview 05/16/20: anteroseptal infarct. New since 2020  Patient Profile     79 y.o. male a hx of CAD with prior CABG who is being seen today for the evaluation of chest pain at the request of Dr Tamala Julian.  Assessment & Plan    1. Unstable angina. Cardiac enzymes are negative. Ecg is normal. Recent nuclear stress test reportedly showed a new anteroseptal perfusion abnormality compared to 2020. Unable to pull up images. Echo shows normal LV function. Patient is s/p CABG by Dr Roxan Hockey in 2011 with LIMA to the LAD,  SVG to ramus, and sequential SVG to PDA and PL. Patient on ASA, statin, metoprolol and amlodipine. Plan cardiac cath today. Patient has absent left radial pulse (reports he had an arterial line placed there when he had his left carotid endarterectomy) will need femoral access. The procedure and risks were reviewed including but not limited to death, myocardial infarction, stroke, arrythmias, bleeding, transfusion, emergency surgery, dye allergy, or renal dysfunction. The patient voices understanding and is agreeable to proceed..   2. HTN. BP improved with addition of amlodipine.   3. Chronic LE edema. Assess LV filling pressures today.  4. DM poorly controlled. A1c 8.3%. per primary care  5. Hyperlipidemia. On lipitor 20 mg. Will check lipid panel.  6. CKD stage 3a- hydrating for cath  7. Anemia. Patient reports history of iron deficiency anemia with remote Mallory Weiss tear. No recent bleeding. Anemia panel pending. May need to resume iron therapy. Heme check stool.  8. Carotid arterial disease. S/p bilateral CEAs.       For questions or updates, please contact [place] Please consult [email or web address] for contact info under        Signed, Jasiel Belisle [place], MD  05/27/2020, 7:36 AM

## 2020-05-28 ENCOUNTER — Telehealth: Payer: Self-pay | Admitting: Cardiovascular Disease

## 2020-05-28 ENCOUNTER — Telehealth (HOSPITAL_COMMUNITY): Payer: Self-pay | Admitting: Cardiovascular Disease

## 2020-05-28 ENCOUNTER — Other Ambulatory Visit: Payer: Self-pay

## 2020-05-28 DIAGNOSIS — Z951 Presence of aortocoronary bypass graft: Secondary | ICD-10-CM

## 2020-05-28 DIAGNOSIS — D649 Anemia, unspecified: Secondary | ICD-10-CM

## 2020-05-28 DIAGNOSIS — I208 Other forms of angina pectoris: Secondary | ICD-10-CM

## 2020-05-28 DIAGNOSIS — I10 Essential (primary) hypertension: Secondary | ICD-10-CM

## 2020-05-28 LAB — FERRITIN: Ferritin: 30 ng/mL (ref 24–336)

## 2020-05-28 LAB — GLUCOSE, CAPILLARY: Glucose-Capillary: 145 mg/dL — ABNORMAL HIGH (ref 70–99)

## 2020-05-28 LAB — IRON AND TIBC
Iron: 113 ug/dL (ref 45–182)
Saturation Ratios: 27 % (ref 17.9–39.5)
TIBC: 424 ug/dL (ref 250–450)
UIBC: 311 ug/dL

## 2020-05-28 LAB — RETICULOCYTES
Immature Retic Fract: 30.5 % — ABNORMAL HIGH (ref 2.3–15.9)
RBC.: 3.37 MIL/uL — ABNORMAL LOW (ref 4.22–5.81)
Retic Count, Absolute: 48.5 10*3/uL (ref 19.0–186.0)
Retic Ct Pct: 1.4 % (ref 0.4–3.1)

## 2020-05-28 LAB — FOLATE: Folate: 39.5 ng/mL (ref >5.9)

## 2020-05-28 LAB — VITAMIN B12: Vitamin B-12: 232 pg/mL (ref 180–914)

## 2020-05-28 MED ORDER — ATORVASTATIN CALCIUM 40 MG PO TABS: 40.0000 mg | ORAL_TABLET | Freq: Every evening | ORAL | 2 refills | Status: AC

## 2020-05-28 MED ORDER — AMLODIPINE BESYLATE 5 MG PO TABS: 5.0000 mg | ORAL_TABLET | Freq: Every day | ORAL | 2 refills | Status: DC

## 2020-05-28 MED ORDER — VITAMIN B-12 1000 MCG PO TABS
1000.0000 ug | ORAL_TABLET | Freq: Every day | ORAL | 1 refills | Status: DC
Start: 2020-05-28 — End: 2021-07-16

## 2020-05-28 MED ORDER — ATORVASTATIN CALCIUM 40 MG PO TABS: 40.0000 mg | ORAL_TABLET | Freq: Every evening | ORAL | Status: DC

## 2020-05-28 NOTE — Discharge Summary (Addendum)
Physician Discharge Summary  Thomas SPAINHOUR ELF:[id number] DOB: Jan 12, 1941 DOA: 05/26/2020  PCP: Algis Greenhouse, MD  Admit date: 05/26/2020 Discharge date: 05/28/2020  Time spent: 60 minutes  Recommendations for Outpatient Follow-up:  1. Outpatient stool for occult blood testing.  Patient will be given FOBT cards, which he will sent to his PCP for occult blood testing. 2. Follow-up cardiology as outpatient  Discharge Diagnoses:  Principal Problem:   Chest pain Active Problems:   CAD, multiple vessel   S/P CABG x 4   COPD exacerbation ([place])   Discharge Condition: Stable  Diet recommendation: Heart healthy/carb modified diet  Filed Weights   05/26/20 0928 05/27/20 0500  Weight: 80.8 kg 80.9 kg    History of present illness:  79 year old male with a history of CAD, prior CABG, hypertension, hyperlipidemia, COPD, diabetes mellitus type 2 presented with chest pain.  Patient has been having intermittent chest pain for which she went underwent cardiac stress test by his cardiology.  Stress test results for intermediate risk, plan was to proceed with heart catheterization on 05/27/2020.  Hospital Course:  Chest pain-resolved cardiac enzymes were negative, ECG was normal.  Patient underwent left heart cath which showed patent saphenous vein grafts and severe native vessel CAD with total occlusion of proximal to mid LAD, total occlusion of the native circumflex.  Recommended medical management.  Patient is currently on aspirin, statin, metoprolol, amlodipine.  Patient will follow up with cardiology in 2 weeks.  Hypertension-continue home medication including HCTZ, Benicar, metoprolol.  Amlodipine 5 mg added.  Anemia of chronic disease-patient has anemia of chronic disease, he has history of GI bleed in the past.  No blood in the stool or black stool noted in the hospital.  Hemoglobin dropped from 9.5-8.2.  Patient does not want to stay in the hospital to check FOBT.  We will give him FOBT cards to  be done as outpatient and follow-up with PCP.  Patient is hemodynamically stable.  Anemia panel shows normal serum iron, B12 is 232 with borderline.  Will start B12 supplementation 1000 mcg daily.  Diabetes mellitus type 2-continue Metformin.  Hemoglobin A1c is 8.3.  Follow-up with PCP as outpatient for adjustment of medications.  Hyperlipidemia-Lipitor increased to 40 mg p.o. daily.  CKD stage IIIa-creatinine is stable.  Carotid artery disease-s/p bilateral CEAs.  COPD-continue nebulizers as needed.    Procedures:  Left heart cardiac catheterization  Consultations:  Cardiology  Discharge Exam: Vitals:   05/28/20 0310 05/28/20 0750  BP: (!) 169/65 (!) 148/57  Pulse: 66   Resp: 15 19  Temp: 97.6 F (36.4 C) 98 F (36.7 C)  SpO2: 100%     General: Appears in no acute distress Cardiovascular: S1-S2, regular, no murmur auscultated Respiratory: Clear to auscultation bilaterally, no wheezing or crackles auscultated  Discharge Instructions   Discharge Instructions    Diet - low sodium heart healthy   Complete by: As directed    Increase activity slowly   Complete by: As directed      Allergies as of 05/28/2020   No Known Allergies     Medication List    TAKE these medications   amLODipine 5 MG tablet Commonly known as: NORVASC Take 1 tablet (5 mg total) by mouth daily.   aspirin EC 81 MG tablet Take 81 mg by mouth every evening.   atorvastatin 40 MG tablet Commonly known as: LIPITOR Take 1 tablet (40 mg total) by mouth every evening. What changed:   medication strength  how much  to take  when to take this   cetirizine 10 MG tablet Commonly known as: ZYRTEC Take 10 mg by mouth daily as needed for allergies or rhinitis.   hydrochlorothiazide 25 MG tablet Commonly known as: HYDRODIURIL TAKE 1 TABLET BY MOUTH EVERY DAY What changed: how much to take   metFORMIN 500 MG tablet Commonly known as: GLUCOPHAGE Take 500 mg by mouth 2 (two) times daily  with a meal.   metoprolol tartrate 50 MG tablet Commonly known as: LOPRESSOR Take 1 tablet (50 mg total) by mouth 2 (two) times daily.   olmesartan 20 MG tablet Commonly known as: BENICAR TAKE 1 TABLET BY MOUTH EVERY DAY   omeprazole 40 MG capsule Commonly known as: PRILOSEC Take 1 capsule (40 mg total) by mouth daily. KEEP OV.   [facility] Health Caps Take 1 capsule by mouth daily.   vitamin B-12 1000 MCG tablet Commonly known as: CYANOCOBALAMIN Take 1 tablet (1,000 mcg total) by mouth daily.      No Known Allergies  Follow-up Information    [name], MD Follow up in 2 week(s).   Specialties: Cardiology, Radiology Contact information: [address] [place] [address] [phone]        Algis Greenhouse, MD Follow up in 1 week(s).   Specialty: Family Medicine Why: Call to submit FOBT cards Contact information: [place] [address] [phone]                The results of significant diagnostics from this hospitalization (including imaging, microbiology, ancillary and laboratory) are listed below for reference.    Significant Diagnostic Studies: CARDIAC CATHETERIZATION  Result Date: 05/27/2020  Severe native vessel CAD with total occlusion of proximal to mid LAD, total occlusion of the native circumflex, high-grade diffuse obstruction and a large ramus intermedius, and diffuse high-grade 99% stenosis in proximal and distal RCA.  Patent sequential saphenous vein graft to PDA and PL.  Patent saphenous vein graft to trifurcating ramus intermedius.  Patent LIMA to distal LAD.  Normal left ventricular systolic function.  LVEF 55%.  LVEDP between 15 and 20 mmHg. RECOMMENDATIONS:  Further evaluation is needed by the primary team.  Preventive therapy.  Aggressive risk factor modification including blood pressure control.  MYOCARDIAL PERFUSION IMAGING  Result Date: 05/16/2020  Nuclear stress EF: 65%.  The left  ventricular ejection fraction is normal (55-65%).  ST segment depression was noted during stress.  Defect 1: There is a medium defect of severe severity present in the basal anterior and mid anterior location.  Findings consistent with prior myocardial infarction.  This is an intermediate risk study.  Abnormal intermediate risk stress nuclear study with increased ST depression during Lexiscan infusion.  There appears to be a prior anterior infarct but no ischemia.  Gated ejection fraction 65% with normal wall motion.  Study was reviewed with Dr. Gwenlyn Found prior to discharge and patient has been scheduled for follow-up.   ECHOCARDIOGRAM COMPLETE  Result Date: 05/26/2020    ECHOCARDIOGRAM REPORT   Patient Name:   Thomas Gray  Date of Exam: 05/26/2020 Medical Rec #:  [id number]  Height:       67.0 in Accession #:    [id number] Weight:       179.6 lb Date of Birth:  06-18-1941  BSA:          1.932 m Patient Age:    54 years   BP:  140/61 mmHg Patient Gender: M          HR:           76 bpm. Exam Location:  Inpatient Procedure: 2D Echo, Color Doppler and Cardiac Doppler Indications:    R07.9* Chest pain, unspecified  History:        Patient has prior history of Echocardiogram examinations, most                 recent 12/18/2012. Prior CABG, COPD; Risk Factors:Hypertension,                 Diabetes and Dyslipidemia.  Sonographer:    Raquel Sarna Senior RDCS Referring Phys: [id number] [place]  1. Left ventricular ejection fraction, by estimation, is 65 to 70%. The left ventricle has normal function. The left ventricle has no regional wall motion abnormalities. There is moderate asymmetric left ventricular hypertrophy of the septal segment. Left ventricular diastolic parameters are indeterminate.  2. Right ventricular systolic function is normal. The right ventricular size is normal. There is mildly elevated pulmonary artery systolic pressure. The estimated right ventricular systolic pressure is 45.4 mmHg.   3. Left atrial size was moderately dilated.  4. The mitral valve is grossly normal. Trivial mitral valve regurgitation.  5. The aortic valve is tricuspid. Aortic valve regurgitation is not visualized. Mild aortic valve sclerosis is present, with no evidence of aortic valve stenosis.  6. The inferior vena cava is normal in size with greater than 50% respiratory variability, suggesting right atrial pressure of 3 mmHg. FINDINGS  Left Ventricle: Left ventricular ejection fraction, by estimation, is 65 to 70%. The left ventricle has normal function. The left ventricle has no regional wall motion abnormalities. The left ventricular internal cavity size was normal in size. There is  moderate asymmetric left ventricular hypertrophy of the septal segment. Left ventricular diastolic parameters are indeterminate. Right Ventricle: The right ventricular size is normal. No increase in right ventricular wall thickness. Right ventricular systolic function is normal. There is mildly elevated pulmonary artery systolic pressure. The tricuspid regurgitant velocity is 3.05  m/s, and with an assumed right atrial pressure of 3 mmHg, the estimated right ventricular systolic pressure is 09.8 mmHg. Left Atrium: Left atrial size was moderately dilated. Right Atrium: Right atrial size was normal in size. Pericardium: There is no evidence of pericardial effusion. Mitral Valve: The mitral valve is grossly normal. Trivial mitral valve regurgitation. Tricuspid Valve: The tricuspid valve is grossly normal. Tricuspid valve regurgitation is mild. Aortic Valve: The aortic valve is tricuspid. Aortic valve regurgitation is not visualized. Mild aortic valve sclerosis is present, with no evidence of aortic valve stenosis. Pulmonic Valve: The pulmonic valve was grossly normal. Pulmonic valve regurgitation is trivial. Aorta: The aortic root is normal in size and structure. Venous: The inferior vena cava is normal in size with greater than 50% respiratory  variability, suggesting right atrial pressure of 3 mmHg. IAS/Shunts: No atrial level shunt detected by color flow Doppler.  LEFT VENTRICLE PLAX 2D LVIDd:         3.40 cm  Diastology LVIDs:         2.10 cm  LV e' lateral:   7.51 cm/s LV PW:         1.00 cm  LV E/e' lateral: 14.1 LV IVS:        1.40 cm  LV e' medial:    6.85 cm/s LVOT diam:     1.90 cm  LV E/e' medial:  15.5 LV  SV:         67 LV SV Index:   35 LVOT Area:     2.84 cm  RIGHT VENTRICLE RV S prime:     9.57 cm/s TAPSE (M-mode): 1.8 cm LEFT ATRIUM             Index       RIGHT ATRIUM           Index LA diam:        4.30 cm 2.23 cm/m  RA Area:     17.70 cm LA Vol (A2C):   55.8 ml 28.88 ml/m RA Volume:   43.50 ml  22.52 ml/m LA Vol (A4C):   86.1 ml 44.57 ml/m LA Biplane Vol: 74.6 ml 38.62 ml/m  AORTIC VALVE LVOT Vmax:   107.00 cm/s LVOT Vmean:  75.100 cm/s LVOT VTI:    0.238 m  AORTA Ao Root diam: 3.20 cm Ao Asc diam:  3.50 cm MITRAL VALVE                TRICUSPID VALVE MV Area (PHT): 3.72 cm     TR Peak grad:   37.2 mmHg MV Decel Time: 204 msec     TR Vmax:        305.00 cm/s MV E velocity: 106.00 cm/s MV A velocity: 88.60 cm/s   SHUNTS MV E/A ratio:  1.20         Systemic VTI:  0.24 m                             Systemic Diam: 1.90 cm Rozann Lesches MD Electronically signed by Rozann Lesches MD Signature Date/Time: 05/26/2020/5:21:45 PM    Final     Microbiology: Recent Results (from the past 240 hour(s))  MRSA PCR Screening     Status: None   Collection Time: 05/26/20  9:27 AM   Specimen: Nasal Mucosa; Nasopharyngeal  Result Value Ref Range Status   MRSA by PCR NEGATIVE NEGATIVE Final    Comment:        The GeneXpert MRSA Assay (FDA approved for NASAL specimens only), is one component of a comprehensive MRSA colonization surveillance program. It is not intended to diagnose MRSA infection nor to guide or monitor treatment for MRSA infections. Performed at [facility], [place] [address] [place], [place] [address]       Labs: Basic Metabolic Panel: Recent Labs  Lab 05/21/20 1133 05/26/20 1547 05/27/20 0229  NA 137 139 136  K 4.7 4.6 4.3  CL 101 103 102  CO2 23 26 26   GLUCOSE 206* 176* 150*  BUN 25 27* 27*  CREATININE 1.31* 1.41* 1.35*  CALCIUM 9.5 9.2 8.6*   CBC: Recent Labs  Lab 05/21/20 1133 05/26/20 1547 05/27/20 0229  WBC 7.3 7.7 6.1  HGB 9.7* 9.5* 8.2*  HCT 30.7* 30.5* 26.9*  MCV 85 86.6 85.1  PLT 239 271 229    CBG: Recent Labs  Lab 05/27/20 1159 05/27/20 1238 05/27/20 1612 05/27/20 2057 05/28/20 0555  GLUCAP 149* 146* 197* 158* 145*       Signed:  Oswald Hillock MD.  [facility] 05/28/2020, 9:23 AM

## 2020-05-28 NOTE — Progress Notes (Signed)
Discharge instructions given to patient, follow up appointments and education regarding new medications. Patient is discharging home with son who will be staying with patient. IV's were removed without complication with skin intact upon removal.

## 2020-05-28 NOTE — Plan of Care (Signed)

## 2020-05-28 NOTE — Telephone Encounter (Signed)
Patient called and cancelled echocardiogram that was scheduled at [facility] office due to he was admitted to the hospital at [facility] and they did it there. Order was cancelled from the [facility].Marland Kitchen

## 2020-05-28 NOTE — Progress Notes (Addendum)
Progress Note  Patient Name: Thomas Gray Date of Encounter: 05/28/2020  [facility] Cardiologist: Quay Burow MD  Subjective   No chest pain or dyspnea since admission  Inpatient Medications    Scheduled Meds: . amLODipine  5 mg Oral Daily  . aspirin  81 mg Oral Daily  . atorvastatin  20 mg Oral QPM  . heparin  5,000 Units Subcutaneous Q8H  . insulin aspart  0-9 Units Subcutaneous TID WC  . metoprolol tartrate  50 mg Oral BID  . pantoprazole  40 mg Oral Daily  . sodium chloride flush  3 mL Intravenous Q12H  . sodium chloride flush  3 mL Intravenous Q12H   Continuous Infusions: . sodium chloride     PRN Meds: sodium chloride, acetaminophen, albuterol, alum & mag hydroxide-simeth, hydrALAZINE, ondansetron (ZOFRAN) IV, oxyCODONE, sodium chloride flush   Vital Signs    Vitals:   05/27/20 1215 05/27/20 1912 05/27/20 2342 05/28/20 0310  BP: (!) 129/38 (!) 149/62 (!) 137/59 (!) 169/65  Pulse: 63 71 (!) 58 66  Resp: 15 18 14 15   Temp:  98 F (36.7 C) 97.7 F (36.5 C) 97.6 F (36.4 C)  TempSrc:  Oral Oral Oral  SpO2: 98% 94% 90% 100%  Weight:      Height:        Intake/Output Summary (Last 24 hours) at 05/28/2020 0706 Last data filed at 05/28/2020 0650 Gross per 24 hour  Intake 884.54 ml  Output 1900 ml  Net -1015.46 ml   Last 3 Weights 05/27/2020 05/26/2020 05/20/2020  Weight (lbs) 178 lb 5.6 oz 178 lb 1.6 oz 179 lb 9.6 oz  Weight (kg) 80.9 kg 80.786 kg 81.466 kg      Telemetry    NSR - Personally Reviewed  ECG    NSR with normal Ecg - Personally Reviewed  Physical Exam   GEN: No acute distress.   Neck: No JVD, bilateral CEA scars Cardiac: RRR, no murmurs, rubs, or gallops. Absent left radial pulse. Femoral pulses 2+ Respiratory: Clear to auscultation bilaterally. GI: Soft, nontender, non-distended  MS: No edema; No deformity. No right groin hematoma Neuro:  Nonfocal  Psych: Normal affect   Labs    High Sensitivity Troponin:   Recent Labs  Lab  05/27/20 0749 05/27/20 0853  TROPONINIHS 171* 134*      Chemistry Recent Labs  Lab 05/21/20 1133 05/26/20 1547 05/27/20 0229  NA 137 139 136  K 4.7 4.6 4.3  CL 101 103 102  CO2 23 26 26   GLUCOSE 206* 176* 150*  BUN 25 27* 27*  CREATININE 1.31* 1.41* 1.35*  CALCIUM 9.5 9.2 8.6*  GFRNONAA 51* 47* 50*  GFRAA 59* 55* 57*  ANIONGAP  --  10 8     Hematology Recent Labs  Lab 05/21/20 1133 05/26/20 1547 05/27/20 0229 05/28/20 0339  WBC 7.3 7.7 6.1  --   RBC 3.60* 3.52* 3.16* 3.37*  HGB 9.7* 9.5* 8.2*  --   HCT 30.7* 30.5* 26.9*  --   MCV 85 86.6 85.1  --   MCH 26.9 27.0 25.9*  --   MCHC 31.6 31.1 30.5  --   RDW 22.1* 23.0* 22.7*  --   PLT 239 271 229  --     BNPNo results for input(s): BNP, PROBNP in the last 168 hours.   DDimer No results for input(s): DDIMER in the last 168 hours.   Radiology    CARDIAC CATHETERIZATION  Result Date: 05/27/2020  Severe native vessel  CAD with total occlusion of proximal to mid LAD, total occlusion of the native circumflex, high-grade diffuse obstruction and a large ramus intermedius, and diffuse high-grade 99% stenosis in proximal and distal RCA.  Patent sequential saphenous vein graft to PDA and PL.  Patent saphenous vein graft to trifurcating ramus intermedius.  Patent LIMA to distal LAD.  Normal left ventricular systolic function.  LVEF 55%.  LVEDP between 15 and 20 mmHg. RECOMMENDATIONS:  Further evaluation is needed by the primary team.  Preventive therapy.  Aggressive risk factor modification including blood pressure control.  ECHOCARDIOGRAM COMPLETE  Result Date: 05/26/2020    ECHOCARDIOGRAM REPORT   Patient Name:   Thomas Gray  Date of Exam: 05/26/2020 Medical Rec #:  [id number]  Height:       67.0 in Accession #:    [id number] Weight:       179.6 lb Date of Birth:  1941/09/03  BSA:          1.932 m Patient Age:    79 years   BP:           140/61 mmHg Patient Gender: M          HR:           76 bpm. Exam Location:  Inpatient  Procedure: 2D Echo, Color Doppler and Cardiac Doppler Indications:    R07.9* Chest pain, unspecified  History:        Patient has prior history of Echocardiogram examinations, most                 recent 12/18/2012. Prior CABG, COPD; Risk Factors:Hypertension,                 Diabetes and Dyslipidemia.  Sonographer:    Raquel Sarna Senior RDCS Referring Phys: [id number] Shavawn Stobaugh  1. Left ventricular ejection fraction, by estimation, is 65 to 70%. The left ventricle has normal function. The left ventricle has no regional wall motion abnormalities. There is moderate asymmetric left ventricular hypertrophy of the septal segment. Left ventricular diastolic parameters are indeterminate.  2. Right ventricular systolic function is normal. The right ventricular size is normal. There is mildly elevated pulmonary artery systolic pressure. The estimated right ventricular systolic pressure is 27.5 mmHg.  3. Left atrial size was moderately dilated.  4. The mitral valve is grossly normal. Trivial mitral valve regurgitation.  5. The aortic valve is tricuspid. Aortic valve regurgitation is not visualized. Mild aortic valve sclerosis is present, with no evidence of aortic valve stenosis.  6. The inferior vena cava is normal in size with greater than 50% respiratory variability, suggesting right atrial pressure of 3 mmHg. FINDINGS  Left Ventricle: Left ventricular ejection fraction, by estimation, is 65 to 70%. The left ventricle has normal function. The left ventricle has no regional wall motion abnormalities. The left ventricular internal cavity size was normal in size. There is  moderate asymmetric left ventricular hypertrophy of the septal segment. Left ventricular diastolic parameters are indeterminate. Right Ventricle: The right ventricular size is normal. No increase in right ventricular wall thickness. Right ventricular systolic function is normal. There is mildly elevated pulmonary artery systolic pressure. The  tricuspid regurgitant velocity is 3.05  m/s, and with an assumed right atrial pressure of 3 mmHg, the estimated right ventricular systolic pressure is 17.0 mmHg. Left Atrium: Left atrial size was moderately dilated. Right Atrium: Right atrial size was normal in size. Pericardium: There is no evidence of pericardial effusion. Mitral Valve: The mitral valve is  grossly normal. Trivial mitral valve regurgitation. Tricuspid Valve: The tricuspid valve is grossly normal. Tricuspid valve regurgitation is mild. Aortic Valve: The aortic valve is tricuspid. Aortic valve regurgitation is not visualized. Mild aortic valve sclerosis is present, with no evidence of aortic valve stenosis. Pulmonic Valve: The pulmonic valve was grossly normal. Pulmonic valve regurgitation is trivial. Aorta: The aortic root is normal in size and structure. Venous: The inferior vena cava is normal in size with greater than 50% respiratory variability, suggesting right atrial pressure of 3 mmHg. IAS/Shunts: No atrial level shunt detected by color flow Doppler.  LEFT VENTRICLE PLAX 2D LVIDd:         3.40 cm  Diastology LVIDs:         2.10 cm  LV e' lateral:   7.51 cm/s LV PW:         1.00 cm  LV E/e' lateral: 14.1 LV IVS:        1.40 cm  LV e' medial:    6.85 cm/s LVOT diam:     1.90 cm  LV E/e' medial:  15.5 LV SV:         67 LV SV Index:   35 LVOT Area:     2.84 cm  RIGHT VENTRICLE RV S prime:     9.57 cm/s TAPSE (M-mode): 1.8 cm LEFT ATRIUM             Index       RIGHT ATRIUM           Index LA diam:        4.30 cm 2.23 cm/m  RA Area:     17.70 cm LA Vol (A2C):   55.8 ml 28.88 ml/m RA Volume:   43.50 ml  22.52 ml/m LA Vol (A4C):   86.1 ml 44.57 ml/m LA Biplane Vol: 74.6 ml 38.62 ml/m  AORTIC VALVE LVOT Vmax:   107.00 cm/s LVOT Vmean:  75.100 cm/s LVOT VTI:    0.238 m  AORTA Ao Root diam: 3.20 cm Ao Asc diam:  3.50 cm MITRAL VALVE                TRICUSPID VALVE MV Area (PHT): 3.72 cm     TR Peak grad:   37.2 mmHg MV Decel Time: 204 msec      TR Vmax:        305.00 cm/s MV E velocity: 106.00 cm/s MV A velocity: 88.60 cm/s   SHUNTS MV E/A ratio:  1.20         Systemic VTI:  0.24 m                             Systemic Diam: 1.90 cm Rozann Lesches MD Electronically signed by Rozann Lesches MD Signature Date/Time: 05/26/2020/5:21:45 PM    Final     Cardiac Studies   Myoview 05/16/20: anteroseptal infarct. 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  Patent sequential saphenous vein graft to PDA and PL.  Patent saphenous vein graft to trifurcating ramus intermedius.  Patent LIMA to distal LAD.  Normal left ventricular systolic function.  LVEF 55%.  LVEDP between 15 and 20 mmHg.  RECOMMENDATIONS:   Further evaluation is needed  by the primary team.  Preventive therapy.  Aggressive risk factor modification including blood pressure control.   Patient Profile     79 y.o. male a hx of CAD with prior CABG who is being seen today for the evaluation of chest pain at the request of Dr Tamala Julian.  Assessment & Plan    1. Chest pain. Cardiac enzymes are negative. Ecg is normal. Recent nuclear stress test reportedly showed a new anteroseptal perfusion abnormality compared to 2020. Unable to pull up images. Echo shows normal LV function. Patient is s/p CABG by Dr Roxan Hockey in 2011 with LIMA to the LAD, SVG to ramus, and sequential SVG to PDA and PL. Cardiac cath yesterday showed all grafts still patent. Occluded LCx which was not previously grafted. No targets for PCI.  Patient on ASA, statin, metoprolol and amlodipine. Recommend continued medical therapy. Stable for DC from our standpoint today. Follow up with Dr Gwenlyn Found.  2. HTN. BP improved with addition of amlodipine.    3. Chronic LE edema. LV filling pressures Ok with EDP 15-20 post hydration.  4. DM poorly controlled. A1c 8.3%. per primary care  5. Hyperlipidemia. On lipitor 20 mg. LDL 74. Would increase lipitor to 40 mg   6. CKD stage 3a- creatinine improved with hydration to 1.35.   7. Anemia. Patient reports history of iron deficiency anemia with remote Mallory Weiss tear. No recent bleeding. Heme check stool. Plans per primary team.  8. Carotid arterial disease. S/p bilateral CEAs.   [facility] will sign off.   Medication Recommendations:  As per [facility] Other recommendations (labs, testing, etc):  none Follow up as an outpatient:  With Dr Quay Burow in 2 weeks.       For questions or updates, please contact [place] Please consult [email or web address] for contact info under        Signed, Kamaria Lucia Martinique, MD  05/28/2020, 7:06 AM

## 2020-05-28 NOTE — Telephone Encounter (Signed)
   Pt wanted to cancel heart cath for tomorrow since he already had the procedure yesterday. Advised it has been canceled

## 2020-05-29 ENCOUNTER — Encounter (HOSPITAL_COMMUNITY): Admission: RE | Payer: Self-pay | Source: Home / Self Care

## 2020-05-29 ENCOUNTER — Ambulatory Visit (HOSPITAL_COMMUNITY): Admission: RE | Admit: 2020-05-29 | Payer: Medicare Other | Source: Home / Self Care | Admitting: Cardiovascular Disease

## 2020-05-29 SURGERY — LEFT HEART CATH AND CORS/GRAFTS ANGIOGRAPHY
Anesthesia: LOCAL

## 2020-06-05 ENCOUNTER — Other Ambulatory Visit (HOSPITAL_COMMUNITY): Payer: Medicare Other

## 2020-06-25 ENCOUNTER — Ambulatory Visit (INDEPENDENT_AMBULATORY_CARE_PROVIDER_SITE_OTHER): Payer: Medicare Other | Admitting: Cardiology

## 2020-06-25 ENCOUNTER — Other Ambulatory Visit: Payer: Self-pay

## 2020-06-25 ENCOUNTER — Encounter: Payer: Self-pay | Admitting: Cardiology

## 2020-06-25 VITALS — BP 142/76 | HR 67 | Ht 66.0 in | Wt 182.4 lb

## 2020-06-25 DIAGNOSIS — I6523 Occlusion and stenosis of bilateral carotid arteries: Secondary | ICD-10-CM

## 2020-06-25 DIAGNOSIS — I208 Other forms of angina pectoris: Secondary | ICD-10-CM

## 2020-06-25 DIAGNOSIS — E782 Mixed hyperlipidemia: Secondary | ICD-10-CM

## 2020-06-25 DIAGNOSIS — N1831 Chronic kidney disease, stage 3a: Secondary | ICD-10-CM | POA: Diagnosis not present

## 2020-06-25 DIAGNOSIS — I10 Essential (primary) hypertension: Secondary | ICD-10-CM | POA: Diagnosis not present

## 2020-06-25 DIAGNOSIS — D631 Anemia in chronic kidney disease: Secondary | ICD-10-CM

## 2020-06-25 DIAGNOSIS — Z951 Presence of aortocoronary bypass graft: Secondary | ICD-10-CM | POA: Diagnosis not present

## 2020-06-25 DIAGNOSIS — D649 Anemia, unspecified: Secondary | ICD-10-CM

## 2020-06-25 NOTE — Assessment & Plan Note (Signed)
LCEA 2011, RCE 2020

## 2020-06-25 NOTE — Progress Notes (Signed)
Cardiology Office Note:    Date:  06/25/2020   ID:  Thomas, Gray 16-Sep-1941, MRN [id number]  PCP:  Algis Greenhouse, MD  Cardiologist:  No primary care provider on file.  Electrophysiologist:  None   Referring MD: Algis Greenhouse, MD   No chief complaint on file. Post hospital follow up  History of Present Illness:    Thomas Gray is a 79 y.o. male with a hx of CAD- s/p RCA, LAD, and OD PTCA in 1987 by Dr Rollene Fare.  The patient remembered me from that hospitalization. He eventually underwent LCEA followed by CABG in 2011.  He had a RCE in 2020.  The patient presented to the hospital with chest pain c/w [place] 05/26/2020.  His troponin peaked at 171.  He underwent coronary angiogram 05/27/2020 which revealed a patent LIMA-LAD, patent SVG-OD, patent SVG- PDA1-PDA2.  He has a small occluded CFX that was not grafted in 2011.  Echo showed LVF 65-70%, moderate LAE. The plan is for medical Rx.   Since discharge he has done well, no c/o chest pain. He was noted to be anemic during this past admission.  Hgb 8.2, serum Fe 113.  He tells me his stool cards were negative for blood.     Past Medical History:  Diagnosis Date  . Anemia   . CAD (coronary artery disease)    12/29/2011 normal R/S nuclear study;08/05/2010 cath smooth 50% ostial left main 60% distal left main, 60% ca+ smooth prox LAD, 70% ramus intermed., 90% prox. near ostial, 80% prox. CX, diffuse 30%prox. RCA, 95% eccentric stenosis mid RCA 5o0-90%distal RCA; 50% distal aortic stenosis  . 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 COPD (chronic obstructive pulmonary disease) ([place])   . Diabetes mellitus without complication ([place])   . H/O discoid lupus erythematosus   . H/O discoid lupus erythematosus   . Heart murmur    Echo:  Mitral regurg. EF 55-65%  . Hyperlipidemia   . Hypertension   . 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 Early and Roxan Hockey    Past Surgical History:  Procedure  Laterality Date  . CAROTID ENDARTERECTOMY  08/07/2010  . CHOLECYSTECTOMY  1991   gall bladder  . CORONARY ARTERY BYPASS GRAFT  08/07/2010  . ENDARTERECTOMY Right 08/03/2019   Procedure: ENDARTERECTOMY RIGHT CAROTID;  Surgeon: Rosetta Posner, MD;  Location: [place];  Service: Vascular;  Laterality: Right;  . LEFT HEART CATH AND CORS/GRAFTS ANGIOGRAPHY N/A 05/27/2020   Procedure: LEFT HEART CATH AND CORS/GRAFTS ANGIOGRAPHY;  Surgeon: Belva Crome, MD;  Location: [facility];  Service: Cardiovascular;  Laterality: N/A;  . PATCH ANGIOPLASTY Right 08/03/2019   Procedure: Patch Angioplasty using Hemashield Platinum Finesse.;  Surgeon: Rosetta Posner, MD;  Location: [place];  Service: Vascular;  Laterality: Right;  . TONSILLECTOMY  1976  . TOOTH EXTRACTION  Jan. and Aug.  2015    Current Medications: Current Meds  Medication Sig  . amLODipine (NORVASC) 5 MG tablet Take 1 tablet (5 mg total) by mouth daily.  Marland Kitchen aspirin EC 81 MG tablet Take 81 mg by mouth every evening.   Marland Kitchen atorvastatin (LIPITOR) 40 MG tablet Take 1 tablet (40 mg total) by mouth every evening.  . cetirizine (ZYRTEC) 10 MG tablet Take 10 mg by mouth daily as needed for allergies or rhinitis.  . hydrochlorothiazide (HYDRODIURIL) 25 MG tablet TAKE 1 TABLET BY MOUTH EVERY DAY (Patient taking differently:  Take 12.5 mg by mouth daily. )  . metFORMIN (GLUCOPHAGE) 500 MG tablet Take 500 mg by mouth 2 (two) times daily with a meal.   . metoprolol tartrate (LOPRESSOR) 50 MG tablet Take 1 tablet (50 mg total) by mouth 2 (two) times daily.  Marland Kitchen olmesartan (BENICAR) 20 MG tablet TAKE 1 TABLET BY MOUTH EVERY DAY (Patient taking differently: Take 20 mg by mouth daily. )  . omeprazole (PRILOSEC) 40 MG capsule Take 1 capsule (40 mg total) by mouth daily. KEEP OV.  . Probiotic Product ([place]) CAPS Take 1 capsule by mouth daily.  . vitamin B-12 (CYANOCOBALAMIN) 1000 MCG tablet Take 1 tablet (1,000 mcg total) by mouth daily.      Allergies:   Patient has no known allergies.   Social History   Socioeconomic History  . Marital status: Single    Spouse name: Not on file  . Number of children: Not on file  . Years of education: Not on file  . Highest education level: Not on file  Occupational History  . Not on file  Tobacco Use  . Smoking status: Former Smoker    Packs/day: 1.00    Types: Cigarettes    Quit date: 08/07/2010    Years since quitting: 9.8  . Smokeless tobacco: Never Used  Vaping Use  . Vaping Use: Never used  Substance and Sexual Activity  . Alcohol use: Yes    Comment: 1 glass of wine weekly  . Drug use: No  . Sexual activity: Not on file  Other Topics Concern  . Not on file  Social History Narrative  . Not on file   Social Determinants of Health   Financial Resource Strain:   . Difficulty of Paying Living Expenses:   Food Insecurity:   . Worried About Charity fundraiser in the Last Year:   . Arboriculturist in the Last Year:   Transportation Needs:   . Film/video editor (Medical):   Marland Kitchen Lack of Transportation (Non-Medical):   Physical Activity:   . Days of Exercise per Week:   . Minutes of Exercise per Session:   Stress:   . Feeling of Stress :   Social Connections:   . Frequency of Communication with Friends and Family:   . Frequency of Social Gatherings with Friends and Family:   . Attends Religious Services:   . Active Member of Clubs or Organizations:   . Attends Archivist Meetings:   Marland Kitchen Marital Status:      Family History: The patient's family history includes Diabetes in his mother; Heart disease in his mother; Hypertension in his mother.  ROS:   Please see the history of present illness.     All other systems reviewed and are negative.  EKGs/Labs/Other Studies Reviewed:    The following studies were reviewed today: Cath 05/27/2020  EKG:  EKG is ordered today.  The ekg ordered 05/26/2020 demonstrates NSR- HR 65  Recent Labs: 07/31/2019: ALT  44 05/27/2020: BUN 27; Creatinine, Ser 1.35; Hemoglobin 8.2; Platelets 229; Potassium 4.3; Sodium 136  Recent Lipid Panel    Component Value Date/Time   CHOL 131 05/27/2020 0749   TRIG 143 05/27/2020 0749   HDL 28 (L) 05/27/2020 0749   CHOLHDL 4.7 05/27/2020 0749   VLDL 29 05/27/2020 0749   LDLCALC 74 05/27/2020 0749    Physical Exam:    VS:  BP (!) 142/76   Pulse 67   Ht 5\' 6"  (1.676 m)  Wt 182 lb 6.4 oz (82.7 kg)   SpO2 92%   BMI 29.44 kg/m     Wt Readings from Last 3 Encounters:  06/25/20 182 lb 6.4 oz (82.7 kg)  05/27/20 178 lb 5.6 oz (80.9 kg)  05/20/20 179 lb 9.6 oz (81.5 kg)     GEN:  Well nourished, well developed in no acute distress HEENT: Normal NECK: No JVD;  LYMPHATICS: No lymphadenopathy CARDIAC: RRR, no murmurs, rubs, gallops RESPIRATORY:  Clear to auscultation without rales, wheezing or rhonchi  ABDOMEN: Soft, non-tender, non-distended MUSCULOSKELETAL:  No edema; No deformity  SKIN: Warm and dry NEUROLOGIC:  Alert and oriented x 3 PSYCHIATRIC:  Normal affect   ASSESSMENT:    Chest pain Slightly elevated Troponin- medical Rx  S/P CABG x 4 Patent grafts at cath July 2021  Essential hypertension, benign Controlled- normal LVF by echo with mod LAE July 2021  Hyperlipidemia Treated  Carotid artery disease ([place]) LCEA 2011, RCE 2020  Anemia I encouraged him to contact his PCP for follow up for this  PLAN:    F/U with your PCP about anemia.  Check f/u CBC and BMP (he would like this done in [place]). F/U Dr Gwenlyn Found in Dec as scheduled.   Medication Adjustments/Labs and Tests Ordered: Current medicines are reviewed at length with the patient today.  Concerns regarding medicines are outlined above.  Orders Placed This Encounter  Procedures  . CBC  . Basic Metabolic Panel (BMET)   No orders of the defined types were placed in this encounter.   Patient Instructions  .Medication Instructions:  Continue current medications  *If you need a  refill on your cardiac medications before your next appointment, please call your pharmacy*   Lab Work: CBC and BMP  If you have labs (blood work) drawn today and your tests are completely normal, you will receive your results only by: Marland Kitchen MyChart Message (if you have MyChart) OR . A paper copy in the mail If you have any lab test that is abnormal or we need to change your treatment, we will call you to review the results.   Testing/Procedures: None Ordered   Follow-Up: At [facility], you and your health needs are our priority.  As part of our continuing mission to provide you with exceptional heart care, we have created designated Provider Care Teams.  These Care Teams include your primary Cardiologist (physician) and Advanced Practice Providers (APPs -  Physician Assistants and Nurse Practitioners) who all work together to provide you with the care you need, when you need it.  We recommend signing up for the patient portal called "MyChart".  Sign up information is provided on this After Visit Summary.  MyChart is used to connect with patients for Virtual Visits (Telemedicine).  Patients are able to view lab/test results, encounter notes, upcoming appointments, etc.  Non-urgent messages can be sent to your provider as well.   To learn more about what you can do with MyChart, go to [email or web address].    Your next appointment:   Keep appointment on December 17th @ 9:45 am  The format for your next appointment:   In Person  Provider:   Quay Burow, MD        Signed, Kerin Ransom, PA-C  06/25/2020 4:57 PM    [place]

## 2020-06-25 NOTE — Assessment & Plan Note (Signed)
Treated

## 2020-06-25 NOTE — Assessment & Plan Note (Signed)
Controlled- normal LVF by echo with mod LAE July 2021

## 2020-06-25 NOTE — Assessment & Plan Note (Signed)
Patent grafts at cath July 2021

## 2020-06-25 NOTE — Assessment & Plan Note (Signed)
I encouraged him to contact his PCP for follow up for this

## 2020-06-25 NOTE — Patient Instructions (Addendum)
.  Medication Instructions:  Continue current medications  *If you need a refill on your cardiac medications before your next appointment, please call your pharmacy*   Lab Work: CBC and BMP  If you have labs (blood work) drawn today and your tests are completely normal, you will receive your results only by: Marland Kitchen MyChart Message (if you have MyChart) OR . A paper copy in the mail If you have any lab test that is abnormal or we need to change your treatment, we will call you to review the results.   Testing/Procedures: None Ordered   Follow-Up: At [facility], you and your health needs are our priority.  As part of our continuing mission to provide you with exceptional heart care, we have created designated Provider Care Teams.  These Care Teams include your primary Cardiologist (physician) and Advanced Practice Providers (APPs -  Physician Assistants and Nurse Practitioners) who all work together to provide you with the care you need, when you need it.  We recommend signing up for the patient portal called "MyChart".  Sign up information is provided on this After Visit Summary.  MyChart is used to connect with patients for Virtual Visits (Telemedicine).  Patients are able to view lab/test results, encounter notes, upcoming appointments, etc.  Non-urgent messages can be sent to your provider as well.   To learn more about what you can do with MyChart, go to [email or web address].    Your next appointment:   Keep appointment on December 17th @ 9:45 am  The format for your next appointment:   In Person  Provider:   Quay Burow, MD

## 2020-06-25 NOTE — Assessment & Plan Note (Signed)
Slightly elevated Troponin- medical Rx

## 2020-06-27 LAB — BASIC METABOLIC PANEL
BUN/Creatinine Ratio: 18 (ref 10–24)
BUN: 24 mg/dL (ref 8–27)
CO2: 23 mmol/L (ref 20–29)
Calcium: 9.4 mg/dL (ref 8.6–10.2)
Chloride: 104 mmol/L (ref 96–106)
Creatinine, Ser: 1.34 mg/dL — ABNORMAL HIGH (ref 0.76–1.27)
GFR calc Af Amer: 58 mL/min/{1.73_m2} — ABNORMAL LOW (ref >59)
GFR calc non Af Amer: 50 mL/min/{1.73_m2} — ABNORMAL LOW (ref >59)
Glucose: 230 mg/dL — ABNORMAL HIGH (ref 65–99)
Potassium: 5.1 mmol/L (ref 3.5–5.2)
Sodium: 139 mmol/L (ref 134–144)

## 2020-06-27 LAB — CBC
Hematocrit: 30 % — ABNORMAL LOW (ref 37.5–51.0)
Hemoglobin: 9.2 g/dL — ABNORMAL LOW (ref 13.0–17.7)
MCH: 26.8 pg (ref 26.6–33.0)
MCHC: 30.7 g/dL — ABNORMAL LOW (ref 31.5–35.7)
MCV: 88 fL (ref 79–97)
Platelets: 231 10*3/uL (ref 150–450)
RBC: 3.43 x10E6/uL — ABNORMAL LOW (ref 4.14–5.80)
RDW: 22.7 % — ABNORMAL HIGH (ref 11.6–15.4)
WBC: 7.5 10*3/uL (ref 3.4–10.8)

## 2020-07-08 ENCOUNTER — Ambulatory Visit (INDEPENDENT_AMBULATORY_CARE_PROVIDER_SITE_OTHER): Payer: Medicare Other | Admitting: Vascular Surgery

## 2020-07-08 ENCOUNTER — Encounter: Payer: Self-pay | Admitting: Vascular Surgery

## 2020-07-08 ENCOUNTER — Other Ambulatory Visit: Payer: Self-pay

## 2020-07-08 ENCOUNTER — Ambulatory Visit (HOSPITAL_COMMUNITY)
Admission: RE | Admit: 2020-07-08 | Discharge: 2020-07-08 | Disposition: A | Discharge status: Home / Self Care | Payer: Medicare Other | Source: Ambulatory Visit | Attending: Family Medicine | Admitting: Family Medicine

## 2020-07-08 VITALS — BP 173/72 | HR 65 | Temp 98.2°F | Resp 20 | Ht 66.0 in | Wt 182.0 lb

## 2020-07-08 DIAGNOSIS — I6523 Occlusion and stenosis of bilateral carotid arteries: Secondary | ICD-10-CM | POA: Diagnosis present

## 2020-07-08 NOTE — Progress Notes (Signed)
[facility] [place]  Patient name: Thomas Gray MRN: [id number] DOB: 11-Nov-1941 Sex: male  REASON FOR VISIT: Follow-up bilateral carotid disease  HPI: Thomas Gray is a 79 y.o. male here today for follow-up. He had undergone prior left carotid endarterectomy in conjunction with coronary artery bypass grafting in 2011. He underwent right carotid endarterectomy for severe asymptomatic disease on 08/03/2019. He continues to do well. He denies any neurologic deficits.  Past Medical History:  Diagnosis Date  . Anemia   . CAD (coronary artery disease)    12/29/2011 normal R/S nuclear study;08/05/2010 cath smooth 50% ostial left main 60% distal left main, 60% ca+ smooth prox LAD, 70% ramus intermed., 90% prox. near ostial, 80% prox. CX, diffuse 30%prox. RCA, 95% eccentric stenosis mid RCA 5o0-90%distal RCA; 50% distal aortic stenosis  . 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 COPD (chronic obstructive pulmonary disease) ([place])   . Diabetes mellitus without complication ([place])   . H/O discoid lupus erythematosus   . H/O discoid lupus erythematosus   . Heart murmur    Echo:  Mitral regurg. EF 55-65%  . Hyperlipidemia   . Hypertension   . 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 Pascale Maves and Hendrickson    Family History  Problem Relation Age of Onset  . Hypertension Mother   . Diabetes Mother   . Heart disease Mother        After age 75    SOCIAL HISTORY: Social History   Tobacco Use  . Smoking status: Former Smoker    Packs/day: 1.00    Types: Cigarettes    Quit date: 08/07/2010    Years since quitting: 9.9  . Smokeless tobacco: Never Used  Substance Use Topics  . Alcohol use: Yes    Comment: 1 glass of wine weekly    No Known Allergies  Current Outpatient Medications  Medication Sig Dispense Refill  . amLODipine (NORVASC) 5 MG tablet Take 1 tablet (5 mg total) by mouth daily. 30  tablet 2  . aspirin EC 81 MG tablet Take 81 mg by mouth every evening.     Marland Kitchen atorvastatin (LIPITOR) 40 MG tablet Take 1 tablet (40 mg total) by mouth every evening. 30 tablet 2  . cetirizine (ZYRTEC) 10 MG tablet Take 10 mg by mouth daily as needed for allergies or rhinitis.    . hydrochlorothiazide (HYDRODIURIL) 25 MG tablet TAKE 1 TABLET BY MOUTH EVERY DAY (Patient taking differently: Take 12.5 mg by mouth daily. ) 90 tablet 1  . metFORMIN (GLUCOPHAGE) 500 MG tablet Take 500 mg by mouth 2 (two) times daily with a meal.   3  . metoprolol tartrate (LOPRESSOR) 50 MG tablet Take 1 tablet (50 mg total) by mouth 2 (two) times daily. 180 tablet 0  . olmesartan (BENICAR) 20 MG tablet TAKE 1 TABLET BY MOUTH EVERY DAY (Patient taking differently: Take 20 mg by mouth daily. ) 90 tablet 2  . omeprazole (PRILOSEC) 40 MG capsule Take 1 capsule (40 mg total) by mouth daily. KEEP OV. 30 capsule 2  . Probiotic Product ([place]) CAPS Take 1 capsule by mouth daily.    . vitamin B-12 (CYANOCOBALAMIN) 1000 MCG tablet Take 1 tablet (1,000 mcg total) by mouth daily. 30 tablet 1   No current facility-administered medications for this visit.    REVIEW OF SYSTEMS:  [X]  denotes positive finding, [ ]  denotes negative finding Cardiac  Comments:  Chest pain or chest pressure:    Shortness of breath upon exertion:    Short of breath when lying flat:    Irregular heart rhythm:        Vascular    Pain in calf, thigh, or hip brought on by ambulation:    Pain in feet at night that wakes you up from your sleep:     Blood clot in your veins:    Leg swelling:           PHYSICAL EXAM: Vitals:   07/08/20 0838 07/08/20 0840  BP: (!) 183/72 (!) 173/72  Pulse: 65   Resp: 20   Temp: 98.2 F (36.8 C)   SpO2: 97%   Weight: 182 lb (82.6 kg)   Height: 5\' 6"  (1.676 m)     GENERAL: The patient is a well-nourished male, in no acute distress. The vital signs are documented above. CARDIOVASCULAR: Carotid  artery incisions are well-healed bilaterally with no bruits bilaterally. 2+ radial pulses bilaterally PULMONARY: There is good air exchange  MUSCULOSKELETAL: There are no major deformities or cyanosis. NEUROLOGIC: No focal weakness or paresthesias are detected. SKIN: There are no ulcers or rashes noted. PSYCHIATRIC: The patient has a normal affect.  DATA:  Carotid duplex was reviewed with the patient. This reveals widely patent endarterectomies bilaterally.  MEDICAL ISSUES: Stable overall. Will be seen again in 1 year with repeat carotid duplex.    Rosetta Posner, MD FACS [facility] Tel [phone] Pager [phone]

## 2020-07-10 ENCOUNTER — Other Ambulatory Visit: Payer: Self-pay | Admitting: Cardiovascular Disease

## 2020-07-15 DIAGNOSIS — L600 Ingrowing nail: Secondary | ICD-10-CM | POA: Insufficient documentation

## 2020-07-21 ENCOUNTER — Other Ambulatory Visit: Payer: Self-pay | Admitting: Cardiovascular Disease

## 2020-07-26 ENCOUNTER — Other Ambulatory Visit: Payer: Self-pay | Admitting: Cardiovascular Disease

## 2020-07-30 ENCOUNTER — Telehealth: Payer: Self-pay

## 2020-07-30 ENCOUNTER — Other Ambulatory Visit: Payer: Self-pay

## 2020-07-30 MED ORDER — AMLODIPINE BESYLATE 5 MG PO TABS: 5.0000 mg | ORAL_TABLET | Freq: Every day | ORAL | 3 refills | Status: DC

## 2020-07-30 MED ORDER — LOSARTAN POTASSIUM 50 MG PO TABS
50.0000 mg | ORAL_TABLET | Freq: Every day | ORAL | 3 refills | Status: DC
Start: 2020-07-30 — End: 2021-08-07

## 2020-07-30 NOTE — Telephone Encounter (Signed)
Spoke to [facility] about Olmesartan too expensive.He advised when finished start back Losartan 50 mg daily.

## 2020-08-21 ENCOUNTER — Other Ambulatory Visit: Payer: Self-pay | Admitting: Cardiovascular Disease

## 2020-10-08 ENCOUNTER — Other Ambulatory Visit: Payer: Self-pay | Admitting: Cardiovascular Disease

## 2020-11-07 ENCOUNTER — Encounter: Payer: Self-pay | Admitting: Cardiovascular Disease

## 2020-11-07 ENCOUNTER — Ambulatory Visit (INDEPENDENT_AMBULATORY_CARE_PROVIDER_SITE_OTHER): Payer: Medicare Other | Admitting: Cardiovascular Disease

## 2020-11-07 ENCOUNTER — Other Ambulatory Visit: Payer: Self-pay

## 2020-11-07 VITALS — BP 164/68 | HR 70 | Ht 66.0 in | Wt 183.0 lb

## 2020-11-07 DIAGNOSIS — I10 Essential (primary) hypertension: Secondary | ICD-10-CM

## 2020-11-07 DIAGNOSIS — Z951 Presence of aortocoronary bypass graft: Secondary | ICD-10-CM | POA: Diagnosis not present

## 2020-11-07 DIAGNOSIS — E782 Mixed hyperlipidemia: Secondary | ICD-10-CM | POA: Diagnosis not present

## 2020-11-07 DIAGNOSIS — I6523 Occlusion and stenosis of bilateral carotid arteries: Secondary | ICD-10-CM

## 2020-11-07 NOTE — Assessment & Plan Note (Signed)
History of CAD status post bypass grafting x4 by Dr. Roxan Hockey in 2011.  I got a Myoview stress test 05/16/2020 that showed new anteroapical scar.  Based on this and his symptoms he underwent coronary angiography by Dr. Tamala Julian 05/27/2020 revealing patent grafts with an occluded nondominant circumflex and normal LV function.  It turned out that his hemoglobin was low and after being being put on iron replacement therapy it came up 2 g and his symptoms resolved.

## 2020-11-07 NOTE — Assessment & Plan Note (Signed)
History of carotid artery disease status post left carotid endarterectomy by Dr. [name] at the time of bypass surgery 2011 and recent right carotid endarterectomy.  Carotid Dopplers performed 07/08/2020 revealed widely patent carotid arteries.

## 2020-11-07 NOTE — Progress Notes (Signed)
11/07/2020 Thomas Gray   [birth year]  [id number]  Primary Physician Garlon Hatchet Jaymes Graff, MD Primary Cardiologist: [name] MD Garret Reddish, [place], [place]  HPI:  Thomas Gray is a 79 y.o.  moderately overweight the first Caucasian male father of 3 sons, grandfather to 5 granddaughters who was formerly a patient of Dr. Terance Ice. I last saw him  in the office 05/20/2020. His primary care physician is Dr. Herbie Baltimore Doughin Gordon. His cardiac risk factor profile is significant for 50 pack years of tobacco abuse having quit 4 years ago at the time of his coronary artery bypass grafting. History of hypertension and hyperlipidemia. There is no family history. He has never had a heart attack or stroke. He denies chest pain or shortness of breath. He had coronary artery bypass grafting x4 by Dr. Merilynn Finland in 2011 concomitant left carotid endarterectomy performed by Dr. Curt Jews. A Myoview stress test performed in 2013 was nonischemic. He denies chest pain or shortness of breath.He apparently recently had carotid Dopplers1/04/2019 that showed a widely patent left endarterectomy site with at least moderate to moderately severe right ICA stenosis representing some mild progression.   He exercises at [facility] wellness program at least 5 days a week without symptoms. His normal blood pressures there are 130/60 with a pulse of 65.Has had mild progression of his carotid artery by duplex ultrasound followed byDr..Early.He had a negative Myoview stress test done for preoperative clearance 06/15/2019 and ultimately underwent elective right carotid endarterectomy by Dr. [name] 2 months later.  When I saw him last he was complaining of some dyspnea and chest pain.  A Myoview stress test performed 05/16/2020 showed anteroapical scar without ischemia which is new since his prior stress test performed 06/15/2019.  Based on this he underwent cardiac catheterization by Dr. Tamala Julian 05/27/2020  revealing patent grafts with an occluded nondominant circumflex and normal LV function.  His hemoglobin at that time was 9.2 which ultimately came up with iron repletion therapy to 11.2 and his symptoms completely resolved.  Current Meds  Medication Sig  . amLODipine (NORVASC) 5 MG tablet Take 1 tablet (5 mg total) by mouth daily.  Marland Kitchen aspirin EC 81 MG tablet Take 81 mg by mouth every evening.  Marland Kitchen atorvastatin (LIPITOR) 40 MG tablet Take 1 tablet (40 mg total) by mouth every evening.  . cetirizine (ZYRTEC) 10 MG tablet Take 10 mg by mouth daily as needed for allergies or rhinitis.  . hydrochlorothiazide (HYDRODIURIL) 25 MG tablet TAKE 1 TABLET BY MOUTH EVERY DAY  . losartan ([place]AR) 50 MG tablet Take 1 tablet (50 mg total) by mouth daily.  . metFORMIN (GLUCOPHAGE) 500 MG tablet Take 500 mg by mouth 2 (two) times daily with a meal.   . metoprolol tartrate (LOPRESSOR) 50 MG tablet TAKE 1 TABLET BY MOUTH TWICE A DAY  . omeprazole (PRILOSEC) 40 MG capsule Take 1 capsule (40 mg total) by mouth daily. KEEP OV.  . Probiotic Product ([place]) CAPS Take 1 capsule by mouth daily.  . vitamin B-12 (CYANOCOBALAMIN) 1000 MCG tablet Take 1 tablet (1,000 mcg total) by mouth daily.  . [DISCONTINUED] atorvastatin (LIPITOR) 20 MG tablet TAKE 1 TABLET BY MOUTH EVERY DAY     No Known Allergies  Social History   Socioeconomic History  . Marital status: Single    Spouse name: Not on file  . Number of children: Not on file  . Years of education: Not on file  . Highest  education level: Not on file  Occupational History  . Not on file  Tobacco Use  . Smoking status: Former Smoker    Packs/day: 1.00    Types: Cigarettes    Quit date: 08/07/2010    Years since quitting: 10.2  . Smokeless tobacco: Never Used  Vaping Use  . Vaping Use: Never used  Substance and Sexual Activity  . Alcohol use: Yes    Comment: 1 glass of wine weekly  . Drug use: No  . Sexual activity: Not on file  Other Topics  Concern  . Not on file  Social History Narrative  . Not on file   Social Determinants of Health   Financial Resource Strain: Not on file  Food Insecurity: Not on file  Transportation Needs: Not on file  Physical Activity: Not on file  Stress: Not on file  Social Connections: Not on file  Intimate Partner Violence: Not on file     Review of Systems: General: negative for chills, fever, night sweats or weight changes.  Cardiovascular: negative for chest pain, dyspnea on exertion, edema, orthopnea, palpitations, paroxysmal nocturnal dyspnea or shortness of breath Dermatological: negative for rash Respiratory: negative for cough or wheezing Urologic: negative for hematuria Abdominal: negative for nausea, vomiting, diarrhea, bright red blood per rectum, melena, or hematemesis Neurologic: negative for visual changes, syncope, or dizziness All other systems reviewed and are otherwise negative except as noted above.    Blood pressure (!) 164/68, pulse 70, height 5\' 6"  (1.676 m), weight 183 lb (83 kg).  General appearance: alert and no distress Neck: no adenopathy, no JVD, supple, symmetrical, trachea midline, thyroid not enlarged, symmetric, no tenderness/mass/nodules and Right carotid bruit Lungs: clear to auscultation bilaterally Heart: regular rate and rhythm, S1, S2 normal, no murmur, click, rub or gallop Extremities: extremities normal, atraumatic, no cyanosis or edema Pulses: 2+ and symmetric Skin: Skin color, texture, turgor normal. No rashes or lesions Neurologic: Alert and oriented X 3, normal strength and tone. Normal symmetric reflexes. Normal coordination and gait  EKG sinus rhythm at 70 with nonspecific ST and T wave changes.  Personally reviewed this EKG.  ASSESSMENT AND PLAN:   Occlusion and stenosis of carotid artery History of carotid artery disease status post left carotid endarterectomy by Dr. [name] at the time of bypass surgery 2011 and recent right carotid  endarterectomy.  Carotid Dopplers performed 07/08/2020 revealed widely patent carotid arteries.  S/P CABG x 4 History of CAD status post bypass grafting x4 by Dr. Roxan Hockey in 2011.  I got a Myoview stress test 05/16/2020 that showed new anteroapical scar.  Based on this and his symptoms he underwent coronary angiography by Dr. Tamala Julian 05/27/2020 revealing patent grafts with an occluded nondominant circumflex and normal LV function.  It turned out that his hemoglobin was low and after being being put on iron replacement therapy it came up 2 g and his symptoms resolved.  Essential hypertension, benign History of essential hypertension blood pressure measured at 164/68.  He says usually his blood pressure is much better than this at home.  He is on amlodipine, hydrochlorothiazide, losartan and metoprolol.  Hyperlipidemia History of hyperlipidemia on statin therapy with lipid profile performed 05/27/2020 revealing total cholesterol of 131, LDL of 74 and HDL of 20.      [name] MD FACP,FACC,FAHA, [facility] 11/07/2020 10:38 AM

## 2020-11-07 NOTE — Patient Instructions (Signed)
Medication Instructions:  Your physician recommends that you continue on your current medications as directed. Please refer to the Current Medication list given to you today.  *If you need a refill on your cardiac medications before your next appointment, please call your pharmacy*   Follow-Up: At [facility], you and your health needs are our priority.  As part of our continuing mission to provide you with exceptional heart care, we have created designated Provider Care Teams.  These Care Teams include your primary Cardiologist (physician) and Advanced Practice Providers (APPs -  Physician Assistants and Nurse Practitioners) who all work together to provide you with the care you need, when you need it.  We recommend signing up for the patient portal called "MyChart".  Sign up information is provided on this After Visit Summary.  MyChart is used to connect with patients for Virtual Visits (Telemedicine).  Patients are able to view lab/test results, encounter notes, upcoming appointments, etc.  Non-urgent messages can be sent to your provider as well.   To learn more about what you can do with MyChart, go to [email or web address].    Your next appointment:   12 month(s)  The format for your next appointment:   In Person  Provider:   Dr. Quay Burow

## 2020-11-07 NOTE — Assessment & Plan Note (Signed)
History of hyperlipidemia on statin therapy with lipid profile performed 05/27/2020 revealing total cholesterol of 131, LDL of 74 and HDL of 20.

## 2020-11-07 NOTE — Assessment & Plan Note (Signed)
History of essential hypertension blood pressure measured at 164/68.  He says usually his blood pressure is much better than this at home.  He is on amlodipine, hydrochlorothiazide, losartan and metoprolol.

## 2021-01-19 ENCOUNTER — Telehealth: Payer: Self-pay | Admitting: Oncology

## 2021-01-19 NOTE — Telephone Encounter (Signed)
Patient referred by Dr [name] for Anemia.  Appt made for 01/27/21 Labs 10:30 am - Consult 11:00 am

## 2021-01-26 ENCOUNTER — Other Ambulatory Visit: Payer: Self-pay | Admitting: Oncology

## 2021-01-26 DIAGNOSIS — D539 Nutritional anemia, unspecified: Secondary | ICD-10-CM

## 2021-01-26 DIAGNOSIS — D649 Anemia, unspecified: Secondary | ICD-10-CM

## 2021-01-27 ENCOUNTER — Telehealth: Payer: Self-pay | Admitting: Oncology

## 2021-01-27 ENCOUNTER — Inpatient Hospital Stay: Payer: Medicare Other | Attending: Oncology

## 2021-01-27 ENCOUNTER — Encounter: Payer: Self-pay | Admitting: Oncology

## 2021-01-27 ENCOUNTER — Inpatient Hospital Stay (INDEPENDENT_AMBULATORY_CARE_PROVIDER_SITE_OTHER): Payer: Medicare Other | Admitting: Oncology

## 2021-01-27 ENCOUNTER — Other Ambulatory Visit: Payer: Self-pay

## 2021-01-27 ENCOUNTER — Other Ambulatory Visit: Payer: Self-pay | Admitting: Hematology and Oncology

## 2021-01-27 VITALS — BP 202/86 | HR 86 | Temp 98.0°F | Resp 18 | Ht 66.0 in | Wt 180.0 lb

## 2021-01-27 DIAGNOSIS — M109 Gout, unspecified: Secondary | ICD-10-CM | POA: Insufficient documentation

## 2021-01-27 DIAGNOSIS — D649 Anemia, unspecified: Secondary | ICD-10-CM | POA: Diagnosis not present

## 2021-01-27 DIAGNOSIS — D539 Nutritional anemia, unspecified: Secondary | ICD-10-CM | POA: Diagnosis not present

## 2021-01-27 LAB — HEPATIC FUNCTION PANEL
ALT: 36 (ref 10–40)
AST: 56 — AB (ref 14–40)
Alkaline Phosphatase: 74 (ref 25–125)
Bilirubin, Total: 1.3

## 2021-01-27 LAB — COMPREHENSIVE METABOLIC PANEL
Albumin: 4.7 (ref 3.5–5.0)
Calcium: 9.3 (ref 8.7–10.7)

## 2021-01-27 LAB — VITAMIN B12: Vitamin B-12: 1905 pg/mL — ABNORMAL HIGH (ref 180–914)

## 2021-01-27 LAB — IRON AND TIBC
Iron: 83 ug/dL (ref 45–182)
Saturation Ratios: 24 % (ref 17.9–39.5)
TIBC: 349 ug/dL (ref 250–450)
UIBC: 266 ug/dL

## 2021-01-27 LAB — CBC AND DIFFERENTIAL
HCT: 33 — AB (ref 41–53)
Hemoglobin: 11.1 — AB (ref 13.5–17.5)
Neutrophils Absolute: 5.42
Platelets: 288 (ref 150–399)
WBC: 9.5

## 2021-01-27 LAB — FOLATE: Folate: 21.1 ng/mL (ref >5.9)

## 2021-01-27 LAB — BASIC METABOLIC PANEL
BUN: 19 (ref 4–21)
CO2: 25 — AB (ref 13–22)
Chloride: 100 (ref 99–108)
Creatinine: 1.1 (ref 0.6–1.3)
Glucose: 158
Potassium: 4.2 (ref 3.4–5.3)
Sodium: 137 (ref 137–147)

## 2021-01-27 LAB — FERRITIN: Ferritin: 127 ng/mL (ref 24–336)

## 2021-01-27 LAB — TSH: TSH: 3.4 u[IU]/mL (ref 0.350–4.500)

## 2021-01-27 LAB — URIC ACID: Uric Acid: 8.1 (ref 3.5–8.5)

## 2021-01-27 LAB — CBC
MCV: 101 — AB (ref 80–94)
RBC: 3.22 — AB (ref 3.87–5.11)

## 2021-01-27 NOTE — Telephone Encounter (Signed)
Per 3/8 los next appt sched and given to patient 

## 2021-01-27 NOTE — Progress Notes (Unsigned)
t

## 2021-01-29 LAB — PROTEIN ELECTROPHORESIS, SERUM
A/G Ratio: 1.2 (ref 0.7–1.7)
Albumin ELP: 4.1 g/dL (ref 2.9–4.4)
Alpha-1-Globulin: 0.3 g/dL (ref 0.0–0.4)
Alpha-2-Globulin: 0.8 g/dL (ref 0.4–1.0)
Beta Globulin: 1.1 g/dL (ref 0.7–1.3)
Gamma Globulin: 1.3 g/dL (ref 0.4–1.8)
Globulin, Total: 3.5 g/dL (ref 2.2–3.9)
Total Protein ELP: 7.6 g/dL (ref 6.0–8.5)

## 2021-02-04 NOTE — Progress Notes (Signed)
[facility]  [address] [place],  [place]  [address] [phone]  Clinic Day:  01/27/2021  Referring physician: Algis Greenhouse, MD   HISTORY OF PRESENT ILLNESS:  The patient is a 80 y.o. male who I was asked to consult upon for anemia.  Labs in February 2022 showed a low hemoglobin of 10.3, with an elevated MCV of 100.2.  His iron and B12 levels were fine.  Of note, haptoglobin levels came back undetectable at <30.   The patient denies having any overt forms of blood loss to explain his anemia.  He has been Hemoccult tested 3 times, all of which came back negative.  His last colonoscopy 8 years ago came back negative.  He denies being placed on any new medications that can cause anemia via bone marrow suppression.  To his knowledge, there is no family history of anemia or other hematologic disorders.    PAST MEDICAL HISTORY:   Past Medical History:  Diagnosis Date  . Anemia   . CAD (coronary artery disease)    12/29/2011 normal R/S nuclear study;08/05/2010 cath smooth 50% ostial left main 60% distal left main, 60% ca+ smooth prox LAD, 70% ramus intermed., 90% prox. near ostial, 80% prox. CX, diffuse 30%prox. RCA, 95% eccentric stenosis mid RCA 5o0-90%distal RCA; 50% distal aortic stenosis  . 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 COPD (chronic obstructive pulmonary disease) ([place])   . Diabetes mellitus without complication ([place])   . H/O discoid lupus erythematosus   . H/O discoid lupus erythematosus   . Heart murmur    Echo:  Mitral regurg. EF 55-65%  . Hyperlipidemia   . Hypertension   . 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 Early and Hendrickson  Gout  PAST SURGICAL HISTORY:   Past Surgical History:  Procedure Laterality Date  . CAROTID ENDARTERECTOMY  08/07/2010  . CHOLECYSTECTOMY  1991   gall bladder  . CORONARY ARTERY BYPASS GRAFT  08/07/2010  . ENDARTERECTOMY Right 08/03/2019   Procedure:  ENDARTERECTOMY RIGHT CAROTID;  Surgeon: Rosetta Posner, MD;  Location: [facility];  Service: Vascular;  Laterality: Right;  . LEFT HEART CATH AND CORS/GRAFTS ANGIOGRAPHY N/A 05/27/2020   Procedure: LEFT HEART CATH AND CORS/GRAFTS ANGIOGRAPHY;  Surgeon: Belva Crome, MD;  Location: [facility];  Service: Cardiovascular;  Laterality: N/A;  . PATCH ANGIOPLASTY Right 08/03/2019   Procedure: Patch Angioplasty using Hemashield Platinum Finesse.;  Surgeon: Rosetta Posner, MD;  Location: [facility];  Service: Vascular;  Laterality: Right;  . TONSILLECTOMY  1976  . TOOTH EXTRACTION  Jan. and Aug.  2015    CURRENT MEDICATIONS:   Current Outpatient Medications  Medication Sig Dispense Refill  . Ferrous Sulfate (SLOW RELEASE IRON PO) Take by mouth.    Marland Kitchen amLODipine (NORVASC) 5 MG tablet Take 1 tablet (5 mg total) by mouth daily. 90 tablet 3  . aspirin EC 81 MG tablet Take 81 mg by mouth every evening.    Marland Kitchen atorvastatin (LIPITOR) 40 MG tablet Take 1 tablet (40 mg total) by mouth every evening. 30 tablet 2  . cetirizine (ZYRTEC) 10 MG tablet Take 10 mg by mouth daily as needed for allergies or rhinitis.    . hydrochlorothiazide (HYDRODIURIL) 25 MG tablet TAKE 1 TABLET BY MOUTH EVERY DAY 90 tablet 1  . losartan (COZAAR) 50 MG tablet Take 1 tablet (50 mg total) by mouth daily. 90 tablet 3  .  metFORMIN (GLUCOPHAGE) 500 MG tablet Take 500 mg by mouth 2 (two) times daily with a meal.   3  . metoprolol tartrate (LOPRESSOR) 50 MG tablet TAKE 1 TABLET BY MOUTH TWICE A DAY 180 tablet 3  . omeprazole (PRILOSEC) 40 MG capsule Take 1 capsule (40 mg total) by mouth daily. KEEP OV. 90 capsule 3  . Probiotic Product ([place]) CAPS Take 1 capsule by mouth daily.    . vitamin B-12 (CYANOCOBALAMIN) 1000 MCG tablet Take 1 tablet (1,000 mcg total) by mouth daily. 30 tablet 1   No current facility-administered medications for this visit.    ALLERGIES:  No Known Allergies  FAMILY HISTORY:   Family History   Problem Relation Age of Onset  . Hypertension Mother   . Diabetes Mother   . Heart disease Mother        After age 54  . Tuberculosis Father     SOCIAL HISTORY:  The patient was born in [place].  He lives in town.  He is divorced, with 3 children and 5 grandchildren.  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  He did smoke for 50 years.  He only drank alcohol minimally.    REVIEW OF SYSTEMS:  Review of Systems  Constitutional: Negative for fatigue, fever and unexpected weight change.  Respiratory: Negative for chest tightness, cough, hemoptysis and shortness of breath.   Cardiovascular: Negative for chest pain and palpitations.  Gastrointestinal: Negative for abdominal distention, abdominal pain, blood in stool, constipation, diarrhea, nausea and vomiting.  Genitourinary: Negative for dysuria, frequency and hematuria.   Musculoskeletal: Negative for arthralgias, back pain and myalgias.       Left foot swelling  Skin: Negative for itching and rash.  Neurological: Negative for dizziness, headaches and light-headedness.  Psychiatric/Behavioral: Negative for depression and suicidal ideas. The patient is not nervous/anxious.      PHYSICAL EXAM:  Blood pressure (!) 202/86, pulse 86, temperature 98 F (36.7 C), resp. rate 18, height 5' 6"  (1.676 m), weight 180 lb (81.6 kg), SpO2 96 %. Wt Readings from Last 3 Encounters:  01/27/21 180 lb (81.6 kg)  11/07/20 183 lb (83 kg)  07/08/20 182 lb (82.6 kg)   Body mass index is 29.05 kg/m. Performance status (ECOG): 1 - Symptomatic but completely ambulatory Physical Exam Constitutional:      Appearance: Normal appearance. He is not ill-appearing.  HENT:     Mouth/Throat:     Mouth: Mucous membranes are moist.     Pharynx: Oropharynx is clear. No oropharyngeal exudate or posterior oropharyngeal erythema.  Cardiovascular:     Rate and Rhythm: Normal rate and regular rhythm.     Heart sounds: No murmur heard. No friction rub. No gallop.    Pulmonary:     Effort: Pulmonary effort is normal. No respiratory distress.     Breath sounds: Normal breath sounds. No wheezing, rhonchi or rales.  Chest:  Breasts:     Right: No axillary adenopathy or supraclavicular adenopathy.     Left: No axillary adenopathy or supraclavicular adenopathy.    Abdominal:     General: Bowel sounds are normal. There is no distension.     Palpations: Abdomen is soft. There is no mass.     Tenderness: There is no abdominal tenderness.  Musculoskeletal:        General: Swelling (left foot) present.     Right lower leg: No edema.     Left lower leg: No edema.  Lymphadenopathy:  Cervical: No cervical adenopathy.     Upper Body:     Right upper body: No supraclavicular or axillary adenopathy.     Left upper body: No supraclavicular or axillary adenopathy.     Lower Body: No right inguinal adenopathy. No left inguinal adenopathy.  Skin:    General: Skin is warm.     Coloration: Skin is not jaundiced.     Findings: No lesion or rash.  Neurological:     General: No focal deficit present.     Mental Status: He is alert and oriented to person, place, and time. Mental status is at baseline.     Cranial Nerves: Cranial nerves are intact.  Psychiatric:        Mood and Affect: Mood normal.        Behavior: Behavior normal.        Thought Content: Thought content normal.     LABS:     ASSESSMENT & PLAN:  An 80 y.o. male who I was asked to consult upon for anemia.  I am pleased as his hemoglobin has improved.  As mentioned previously, his past iron and B12 levels were normal.  I will check a serum protein electrophoresis to ensure no underlying plasma cell dyscrasia, such as multiple myeloma, is factoring into his anemia.  As mentioned previously, his haptoglobin level was <30 on 2 occasions, which suggests a hemolytic or consumptive process may be present.  However, his hemogloin has actually risen over time.  Clinically, the patient is doing well.   I will see him back in 3 weeks to go over his labs today as well as to reassess his hemoglobin at that time.  The patient understands all the plans discussed today and is in agreement with them.  I do appreciate Dough, Jaymes Graff, MD for his new consult.   Ark Agrusa Macarthur Critchley, MD

## 2021-02-16 ENCOUNTER — Other Ambulatory Visit: Payer: Self-pay | Admitting: Oncology

## 2021-02-16 DIAGNOSIS — D539 Nutritional anemia, unspecified: Secondary | ICD-10-CM

## 2021-02-16 NOTE — Progress Notes (Signed)
[facility]  [address] [place],  [place]  [address] [phone]  Clinic Day:  02/17/2021  Referring physician: Algis Greenhouse, MD  HISTORY OF PRESENT ILLNESS:  The patient is a 80 y.o. male who I recently began seeing for anemia.  He comes in today to reassess his hemoglobin.  Of note, past labs showed evidence of a possible hemolytic component being present.  Since his last visit, the patient has been doing well. He is currently dealing with gout.  With respect to his anemia, he denies having increased fatigue or any forms of blood loss.  He also denies having brown-colored urine, which would be concerning for hemolytic anemia being present.    PHYSICAL EXAM:  Blood pressure (!) 226/92, pulse 74, temperature 97.9 F (36.6 C), resp. rate 16, height 5\' 6"  (1.676 m), weight 177 lb 14.4 oz (80.7 kg), SpO2 98 %. Wt Readings from Last 3 Encounters:  02/17/21 177 lb 14.4 oz (80.7 kg)  01/27/21 180 lb (81.6 kg)  11/07/20 183 lb (83 kg)   Body mass index is 28.71 kg/m. Performance status (ECOG): 1 - Symptomatic but completely ambulatory Physical Exam Constitutional:      Appearance: Normal appearance. He is not ill-appearing.  HENT:     Mouth/Throat:     Mouth: Mucous membranes are moist.     Pharynx: Oropharynx is clear. No oropharyngeal exudate or posterior oropharyngeal erythema.  Cardiovascular:     Rate and Rhythm: Normal rate and regular rhythm.     Heart sounds: No murmur heard. No friction rub. No gallop.   Pulmonary:     Effort: Pulmonary effort is normal. No respiratory distress.     Breath sounds: Normal breath sounds. No wheezing, rhonchi or rales.  Chest:  Breasts:     Right: No axillary adenopathy or supraclavicular adenopathy.     Left: No axillary adenopathy or supraclavicular adenopathy.    Abdominal:     General: Bowel sounds are normal. There is no distension.     Palpations: Abdomen is soft. There is no mass.      Tenderness: There is no abdominal tenderness.  Musculoskeletal:        General: Swelling (left foot) present.     Right lower leg: No edema.     Left lower leg: No edema.  Lymphadenopathy:     Cervical: No cervical adenopathy.     Upper Body:     Right upper body: No supraclavicular or axillary adenopathy.     Left upper body: No supraclavicular or axillary adenopathy.     Lower Body: No right inguinal adenopathy. No left inguinal adenopathy.  Skin:    General: Skin is warm.     Coloration: Skin is not jaundiced.     Findings: No lesion or rash.  Neurological:     General: No focal deficit present.     Mental Status: He is alert and oriented to person, place, and time. Mental status is at baseline.     Cranial Nerves: Cranial nerves are intact.  Psychiatric:        Mood and Affect: Mood normal.        Behavior: Behavior normal.        Thought Content: Thought content normal.    LABS:    Ref. Range 01/27/2021 10:28  Iron Latest Ref Range: 45 - 182 ug/dL 83  UIBC Latest Units: ug/dL 266  TIBC Latest Ref Range: 250 - 450 ug/dL 349  Saturation Ratios  Latest Ref Range: 17.9 - 39.5 % 24  Ferritin Latest Ref Range: 24 - 336 ng/mL 127  Folate Latest Ref Range: >5.9 ng/mL 21.1    Ref. Range 01/27/2021 10:28  Vitamin B12 Latest Ref Range: 180 - 914 pg/mL 1,905 (H)    Ref. Range 01/27/2021 10:28  Total Protein ELP Latest Ref Range: 6.0 - 8.5 g/dL 7.6  Albumin ELP Latest Ref Range: 2.9 - 4.4 g/dL 4.1  Globulin, Total Latest Ref Range: 2.2 - 3.9 g/dL 3.5  A/G Ratio Latest Ref Range: 0.7 - 1.7  1.2  Alpha-1-Globulin Latest Ref Range: 0.0 - 0.4 g/dL 0.3  Alpha-2-Globulin Latest Ref Range: 0.4 - 1.0 g/dL 0.8  Beta Globulin Latest Ref Range: 0.7 - 1.3 g/dL 1.1  Gamma Globulin Latest Ref Range: 0.4 - 1.8 g/dL 1.3  M-SPIKE, % Latest Ref Range: Not Observed g/dL Not Observed    Ref. Range 02/17/2021 10:18  LDH Latest Ref Range: 98 - 192 U/L 202 (H)   ASSESSMENT & PLAN:  An 80 y.o. male who  I recently began seeing for anemia.  I am pleased as his hemoglobin has held stable over these past few weeks.  Furthermore, his LDH is only minimally elevated.  If hemolysis was taking place, there would have been a noticeable decline in his hemoglobin; furthermore his LDH would be much higher.  His LDH is pending.  However, as his hemoglobin is stable and no other labs have shown another potential etiology behind his anemia, he will be followed conservatively for now.  I will see him back in 3 months for repeat clinical assessment.  The patient understands all the plans discussed today and is in agreement with them.    Omarri Eich Macarthur Critchley, MD

## 2021-02-17 ENCOUNTER — Other Ambulatory Visit: Payer: Self-pay

## 2021-02-17 ENCOUNTER — Other Ambulatory Visit: Payer: Self-pay | Admitting: Hematology and Oncology

## 2021-02-17 ENCOUNTER — Other Ambulatory Visit: Payer: Self-pay | Admitting: Oncology

## 2021-02-17 ENCOUNTER — Inpatient Hospital Stay: Payer: Medicare Other

## 2021-02-17 ENCOUNTER — Inpatient Hospital Stay (INDEPENDENT_AMBULATORY_CARE_PROVIDER_SITE_OTHER): Payer: Medicare Other | Admitting: Oncology

## 2021-02-17 DIAGNOSIS — D649 Anemia, unspecified: Secondary | ICD-10-CM

## 2021-02-17 DIAGNOSIS — D539 Nutritional anemia, unspecified: Secondary | ICD-10-CM | POA: Diagnosis not present

## 2021-02-17 LAB — CBC AND DIFFERENTIAL
HCT: 34 — AB (ref 41–53)
Hemoglobin: 11.2 — AB (ref 13.5–17.5)
Neutrophils Absolute: 4.14
Platelets: 246 (ref 150–399)
WBC: 6.9

## 2021-02-17 LAB — LACTATE DEHYDROGENASE: LDH: 202 U/L — ABNORMAL HIGH (ref 98–192)

## 2021-02-17 LAB — CBC: RBC: 3.28 — AB (ref 3.87–5.11)

## 2021-02-18 LAB — HAPTOGLOBIN: Haptoglobin: 22 mg/dL — ABNORMAL LOW (ref 34–355)

## 2021-04-11 ENCOUNTER — Other Ambulatory Visit: Payer: Self-pay

## 2021-04-11 DIAGNOSIS — I6523 Occlusion and stenosis of bilateral carotid arteries: Secondary | ICD-10-CM

## 2021-04-12 ENCOUNTER — Other Ambulatory Visit: Payer: Self-pay | Admitting: Cardiovascular Disease

## 2021-04-13 NOTE — Telephone Encounter (Signed)
Rx(s) sent to pharmacy electronically.  

## 2021-05-18 NOTE — Progress Notes (Signed)
[facility]  [address] [place],  [place]  [address] [phone]  Clinic Day:  05/20/2021  Referring physician: Algis Greenhouse, MD  This document serves as a record of services personally performed by Dequincy Macarthur Critchley, MD. It was created on their behalf by [facility], a trained medical scribe. The creation of this record is based on the scribe's personal observations and the provider's statements to them.  HISTORY OF PRESENT ILLNESS:  The patient is a 80 y.o. male with mild anemia.  He comes in today to reassess his hemoglobin.  Of note, past labs showed evidence of a possible hemolysis being present.  Since his last visit, the patient has been doing well.  With respect to his anemia, he denies having increased fatigue or any forms of blood loss.  He also denies having brown-colored urine, which would be concerning for hemolytic anemia being present.    PHYSICAL EXAM:  Blood pressure (!) 202/83, pulse 62, resp. rate 16, height 5\' 6"  (1.676 m), weight 183 lb 14.4 oz (83.4 kg), SpO2 99 %. Wt Readings from Last 3 Encounters:  05/20/21 183 lb 14.4 oz (83.4 kg)  02/17/21 177 lb 14.4 oz (80.7 kg)  01/27/21 180 lb (81.6 kg)   Body mass index is 29.68 kg/m. Performance status (ECOG): 1 - Symptomatic but completely ambulatory Physical Exam Constitutional:      Appearance: Normal appearance. He is not ill-appearing.  HENT:     Mouth/Throat:     Mouth: Mucous membranes are moist.     Pharynx: Oropharynx is clear. No oropharyngeal exudate or posterior oropharyngeal erythema.  Cardiovascular:     Rate and Rhythm: Normal rate and regular rhythm.     Heart sounds: No murmur heard.   No friction rub. No gallop.  Pulmonary:     Effort: Pulmonary effort is normal. No respiratory distress.     Breath sounds: Normal breath sounds. No wheezing, rhonchi or rales.  Chest:  Breasts:    Right: No axillary adenopathy or supraclavicular adenopathy.     Left:  No axillary adenopathy or supraclavicular adenopathy.  Abdominal:     General: Bowel sounds are normal. There is no distension.     Palpations: Abdomen is soft. There is no mass.     Tenderness: There is no abdominal tenderness.  Musculoskeletal:        General: Swelling (left foot) present.     Right lower leg: No edema.     Left lower leg: No edema.  Lymphadenopathy:     Cervical: No cervical adenopathy.     Upper Body:     Right upper body: No supraclavicular or axillary adenopathy.     Left upper body: No supraclavicular or axillary adenopathy.     Lower Body: No right inguinal adenopathy. No left inguinal adenopathy.  Skin:    General: Skin is warm.     Coloration: Skin is not jaundiced.     Findings: No lesion or rash.  Neurological:     General: No focal deficit present.     Mental Status: He is alert and oriented to person, place, and time. Mental status is at baseline.     Cranial Nerves: Cranial nerves are intact.  Psychiatric:        Mood and Affect: Mood normal.        Behavior: Behavior normal.        Thought Content: Thought content normal.   LABS:     Ref. Range  05/20/2021 00:00  Sodium Latest Ref Range: 137 - 147  140  Potassium Latest Ref Range: 3.4 - 5.3  4.7  Chloride Latest Ref Range: 99 - 108  106  CO2 Latest Ref Range: 13 - 22  25 (A)  Glucose Unknown 195  BUN Latest Ref Range: 4 - 21  17  Creatinine Latest Ref Range: 0.6 - 1.3  1.1  Calcium Latest Ref Range: 8.7 - 10.7  8.8  Alkaline Phosphatase Latest Ref Range: 25 - 125  68  Albumin Latest Ref Range: 3.5 - 5.0  4.4  AST Latest Ref Range: 14 - 40  54 (A)  ALT Latest Ref Range: 10 - 40  33  Bilirubin, Total Unknown 0.7    Ref. Range 05/20/2021 09:30  LDH Latest Ref Range: 98 - 192 U/L 223 (H)     ASSESSMENT & PLAN:  An 80 y.o. male with mild anemia.  I am pleased as his hemoglobin has held stable over the past 3 months.   His LDH and haptoglobin levels still suggest hemolysis is taking place,  but his stable hemoglobin suggests his marrow is compensating.  There remains no plausible etiology behind his mild hemolysis, but it will continue to be followed conservatively.  Clinically, the patient appears continues to do well.  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 The patient understands all the plans discussed today and is in agreement with them.     I, Rita Ohara, am acting as scribe for Marice Potter, MD    I have reviewed this report as typed by the medical scribe, and it is complete and accurate.  Dequincy Macarthur Critchley, MD

## 2021-05-20 ENCOUNTER — Other Ambulatory Visit: Payer: Self-pay

## 2021-05-20 ENCOUNTER — Other Ambulatory Visit: Payer: Self-pay | Admitting: Oncology

## 2021-05-20 ENCOUNTER — Inpatient Hospital Stay: Payer: Medicare Other

## 2021-05-20 ENCOUNTER — Telehealth: Payer: Self-pay | Admitting: Oncology

## 2021-05-20 ENCOUNTER — Encounter: Payer: Self-pay | Admitting: Oncology

## 2021-05-20 ENCOUNTER — Inpatient Hospital Stay: Payer: Medicare Other | Attending: Oncology | Admitting: Oncology

## 2021-05-20 VITALS — BP 202/83 | HR 62 | Resp 16 | Ht 66.0 in | Wt 183.9 lb

## 2021-05-20 DIAGNOSIS — D649 Anemia, unspecified: Secondary | ICD-10-CM | POA: Diagnosis present

## 2021-05-20 DIAGNOSIS — D539 Nutritional anemia, unspecified: Secondary | ICD-10-CM

## 2021-05-20 LAB — HEPATIC FUNCTION PANEL
ALT: 33 (ref 10–40)
AST: 54 — AB (ref 14–40)
Alkaline Phosphatase: 68 (ref 25–125)
Bilirubin, Total: 0.7

## 2021-05-20 LAB — CBC: RBC: 3.28 — AB (ref 3.87–5.11)

## 2021-05-20 LAB — BASIC METABOLIC PANEL
BUN: 17 (ref 4–21)
CO2: 25 — AB (ref 13–22)
Chloride: 106 (ref 99–108)
Creatinine: 1.1 (ref 0.6–1.3)
Glucose: 195
Potassium: 4.7 (ref 3.4–5.3)
Sodium: 140 (ref 137–147)

## 2021-05-20 LAB — CBC AND DIFFERENTIAL
HCT: 33 — AB (ref 41–53)
Hemoglobin: 11.1 — AB (ref 13.5–17.5)
Neutrophils Absolute: 3.69
Platelets: 223 (ref 150–399)
WBC: 7.1

## 2021-05-20 LAB — COMPREHENSIVE METABOLIC PANEL
Albumin: 4.4 (ref 3.5–5.0)
Calcium: 8.8 (ref 8.7–10.7)

## 2021-05-20 LAB — LACTATE DEHYDROGENASE: LDH: 223 U/L — ABNORMAL HIGH (ref 98–192)

## 2021-05-20 NOTE — Telephone Encounter (Signed)
Per 6/29 LOS, patient scheduled for Oct Appt's.  Gave patient Appt Summary

## 2021-05-22 LAB — HAPTOGLOBIN: Haptoglobin: <10 mg/dL — ABNORMAL LOW (ref 34–355)

## 2021-07-09 ENCOUNTER — Ambulatory Visit: Payer: Medicare Other

## 2021-07-09 ENCOUNTER — Inpatient Hospital Stay (HOSPITAL_COMMUNITY): Admission: RE | Admit: 2021-07-09 | Payer: Medicare Other | Source: Ambulatory Visit

## 2021-07-15 ENCOUNTER — Other Ambulatory Visit: Payer: Self-pay | Admitting: Cardiovascular Disease

## 2021-07-16 ENCOUNTER — Ambulatory Visit (INDEPENDENT_AMBULATORY_CARE_PROVIDER_SITE_OTHER): Payer: Medicare Other | Admitting: Physician Assistant

## 2021-07-16 ENCOUNTER — Ambulatory Visit (HOSPITAL_COMMUNITY)
Admission: RE | Admit: 2021-07-16 | Discharge: 2021-07-16 | Disposition: A | Discharge status: Home / Self Care | Payer: Medicare Other | Source: Ambulatory Visit | Attending: Vascular Surgery | Admitting: Vascular Surgery

## 2021-07-16 ENCOUNTER — Other Ambulatory Visit: Payer: Self-pay

## 2021-07-16 VITALS — BP 191/67 | HR 68 | Temp 97.9°F | Resp 20 | Ht 66.0 in | Wt 183.6 lb

## 2021-07-16 DIAGNOSIS — I6523 Occlusion and stenosis of bilateral carotid arteries: Secondary | ICD-10-CM | POA: Insufficient documentation

## 2021-07-16 NOTE — Progress Notes (Signed)
HISTORY AND PHYSICAL     CC:  follow up. Requesting Provider:  Algis Greenhouse, MD  HPI: This is a 80 y.o. male here for follow up for carotid artery stenosis.  Pt is s/p left CEA for asymptomatic carotid artery stenosis on 08/07/2010 by Dr. [name] and right CEA for asymptomatic carotid artery stenosis on 08/03/2019.  He did undergo CABG in 2011.    Pt was last seen 07/08/2020 by Dr. [name] and at that time he was doing well without any stroke sx.   Pt returns today for follow up.  He has been doing well.  He had a bout of gout but this has improved and has not come back.  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  He does wear compression.  His blood pressure is elevated today.  He takes his pressure at home everyday and it runs [id number] systolic.    Pt denies any amaurosis fugax, speech difficulties, weakness, numbness, paralysis or clumsiness or facial droop.    He denies any rest pain, claudication or non healing wounds.    The pt is on a statin for cholesterol management.  The pt is on a daily aspirin.   Other AC:  none The pt is on CCB ARB BB for hypertension.   The pt is diabetic.   Tobacco hx:  former  Pt does not have family hx of AAA.  Past Medical History:  Diagnosis Date   Anemia    CAD (coronary artery disease)    12/29/2011 normal R/S nuclear study;08/05/2010 cath smooth 50% ostial left main 60% distal left main, 60% ca+ smooth prox LAD, 70% ramus intermed., 90% prox. near ostial, 80% prox. CX, diffuse 30%prox. 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 Early and Roxan Hockey    Past Surgical History:  Procedure Laterality Date   CAROTID ENDARTERECTOMY  08/07/2010   CHOLECYSTECTOMY  1991   gall bladder   CORONARY ARTERY BYPASS GRAFT  08/07/2010   ENDARTERECTOMY Right 08/03/2019   Procedure: ENDARTERECTOMY RIGHT CAROTID;  Surgeon: Rosetta Posner, MD;  Location: [facility] OR;  Service: Vascular;  Laterality: Right;   LEFT HEART CATH AND CORS/GRAFTS ANGIOGRAPHY N/A 05/27/2020   Procedure: LEFT HEART CATH AND CORS/GRAFTS ANGIOGRAPHY;  Surgeon: Belva Crome, MD;  Location: [facility];  Service: Cardiovascular;  Laterality: N/A;   PATCH ANGIOPLASTY Right 08/03/2019   Procedure: Patch Angioplasty using Hemashield Platinum Finesse.;  Surgeon: Rosetta Posner, MD;  Location: [facility] OR;  Service: Vascular;  Laterality: Right;   TONSILLECTOMY  1976   TOOTH EXTRACTION  Jan. and Aug.  2015    No Known Allergies  Current Outpatient Medications  Medication Sig Dispense Refill   amLODipine (NORVASC) 5 MG tablet Take 1 tablet (5 mg total) by mouth daily. 90 tablet 3   aspirin EC 81 MG tablet Take 81 mg by mouth every evening.     atorvastatin (LIPITOR) 40 MG tablet Take 1 tablet (40 mg total) by mouth every evening.  30 tablet 2   cetirizine (ZYRTEC) 10 MG tablet Take 10 mg by mouth daily as needed for allergies or rhinitis.     metFORMIN (GLUCOPHAGE) 500 MG tablet Take 500 mg by mouth 2 (two) times daily with a meal.   3   metoprolol tartrate (LOPRESSOR) 50 MG tablet TAKE 1 TABLET BY MOUTH TWICE A DAY 180 tablet 3   omeprazole (PRILOSEC) 40 MG capsule TAKE 1 CAPSULE (40 MG TOTAL) BY MOUTH DAILY. KEEP OV. 90 capsule 3   Probiotic Product ([place]) CAPS Take 1 capsule by mouth daily.     losartan (COZAAR) 50 MG tablet Take 1 tablet (50 mg total) by mouth daily. 90 tablet 3   No current facility-administered medications for this visit.    Family History  Problem Relation Age of Onset   Hypertension Mother    Diabetes  Mother    Heart disease Mother        After age 61   Tuberculosis Father     Social History   Socioeconomic History   Marital status: Divorced    Spouse name: Not on file   Number of children: 3   Years of education: 12 + 3   Highest education level: Not on file  Occupational History   Occupation: RETIRED    Comment: MEDICAL TECHNICIAN  Tobacco Use   Smoking status: Former    Packs/day: 1.00    Types: Cigarettes    Quit date: 08/07/2010    Years since quitting: 10.9   Smokeless tobacco: Never  Vaping Use   Vaping Use: Never used  Substance and Sexual Activity   Alcohol use: Yes    Comment: 1 glass of wine weekly   Drug use: No   Sexual activity: Not Currently  Other Topics Concern   Not on file  Social History Narrative   Not on file   Social Determinants of Health   Financial Resource Strain: Not on file  Food Insecurity: Not on file  Transportation Needs: Not on file  Physical Activity: Not on file  Stress: Not on file  Social Connections: Not on file  Intimate Partner Violence: Not on file     REVIEW OF SYSTEMS:   '[X]'$  denotes positive finding, '[ ]'$  denotes negative finding Cardiac  Comments:  Chest pain or chest pressure:    Shortness of breath upon exertion:    Short of breath when lying flat:    Irregular heart rhythm:        Vascular    Pain in calf, thigh, or hip brought on by ambulation:    Pain in feet at night that wakes you up from your sleep:     Blood clot in your veins:    Leg swelling:  x       Pulmonary    Oxygen at home:    Productive cough:     Wheezing:         Neurologic    Sudden weakness in arms or legs:     Sudden numbness in arms or legs:     Sudden onset of difficulty speaking or slurred speech:    Temporary loss of vision in one eye:     Problems with dizziness:         Gastrointestinal    Blood in stool:     Vomited blood:         Genitourinary    Burning when urinating:     Blood in urine:  Psychiatric     Major depression:         Hematologic    Bleeding problems:    Problems with blood clotting too easily:        Skin    Rashes or ulcers:        Constitutional    Fever or chills:      PHYSICAL EXAMINATION:  Today's Vitals   07/16/21 1017 07/16/21 1019  BP: (!) 171/73 (!) 191/67  Pulse: 68   Resp: 20   Temp: 97.9 F (36.6 C)   TempSrc: Temporal   SpO2: 97%   Weight: 183 lb 9.6 oz (83.3 kg)   Height: '5\' 6"'$  (1.676 m)    Body mass index is 29.63 kg/m.   General:  WDWN in NAD; vital signs documented above Gait: Not observed HENT: WNL, normocephalic Pulmonary: normal non-labored breathing Cardiac: regular HR, without carotid bruits Abdomen: soft, NT; aortic pulse is not palpable Skin: with rashes Vascular Exam/Pulses:  Right Left  Radial 2+ (normal) 2+ (normal)  DP Unable to palpate Unable to palpate  PT Unable to palpate Unable to palpate   Extremities: without ischemic changes, without Gangrene , without cellulitis; without open wounds; BLE swelling with spider veins present bilaterally Musculoskeletal: no muscle wasting or atrophy  Neurologic: A&O X 3; moving all extremities equally; speech is fluent/normal Psychiatric:  The pt has Normal affect.   Non-Invasive Vascular Imaging:   Carotid Duplex on 07/16/2021: Right:  1-39% ICA stenosis Left:  1-39% ICA stenosis   Previous Carotid duplex on 07/08/2020: Right: 1-39% ICA stenosis Left:   1-39% ICA stenosis    ASSESSMENT/PLAN:: 80 y.o. male who is s/p left CEA for asymptomatic carotid artery stenosis on 08/07/2010 by Dr. [name] and right CEA for asymptomatic carotid artery stenosis on 08/03/2019  -duplex today reveals 1-39% bilateral ICA stenosis.  Pt remains asymptomatic -discussed s/s of stroke with pt and he understands should he develop any of these sx, he will go to the nearest ER or call 911. -pt will f/u in one year with carotid duplex -pt will call sooner should they have any issues. -continue  statin/asa   -he does have BLE leg swelling in presence of varicosities.  Handout on leg elevation, activity, compression and maintaining healthy weight given.  Discussed with him to continue wearing his compression.  He will contact us if the swelling worsens or is bothersome.   Leontine Locket, [facility] [phone]  Clinic MD:  Scot Dock

## 2021-08-02 ENCOUNTER — Other Ambulatory Visit: Payer: Self-pay | Admitting: Cardiovascular Disease

## 2021-08-07 ENCOUNTER — Telehealth: Payer: Self-pay | Admitting: Cardiovascular Disease

## 2021-08-07 MED ORDER — METOPROLOL TARTRATE 50 MG PO TABS: 50.0000 mg | ORAL_TABLET | Freq: Two times a day (BID) | ORAL | 3 refills | Status: DC

## 2021-08-07 MED ORDER — LOSARTAN POTASSIUM 50 MG PO TABS: 50.0000 mg | ORAL_TABLET | Freq: Every day | ORAL | 3 refills | Status: DC

## 2021-08-07 NOTE — Telephone Encounter (Signed)
*  STAT* If patient is at the pharmacy, call can be transferred to refill team.   1. Which medications need to be refilled? (please list name of each medication and dose if known) losartan (COZAAR) 50 MG tablet (Expired) metoprolol tartrate (LOPRESSOR) 50 MG tablet  2. Which pharmacy/location (including street and city if local pharmacy) is medication to be sent to? [id number] [place] [place] [address]. Do they need a 30 day or 90 day supply? [address]

## 2021-08-26 ENCOUNTER — Other Ambulatory Visit: Payer: Self-pay | Admitting: Cardiovascular Disease

## 2021-08-26 NOTE — Telephone Encounter (Signed)
D/C 07/16/21

## 2021-09-14 NOTE — Progress Notes (Incomplete)
Thomas Gray  [address] [place],  [place]  [address] [phone]  Clinic Day:  09/18/2021  Referring physician: Algis Greenhouse, MD  This document serves as a record of services personally performed by Thomas Macarthur Critchley, MD. It was created on their behalf by [facility], a trained medical scribe. The creation of this record is based on the scribe's personal observations and the provider's statements to them.  HISTORY OF PRESENT ILLNESS:  The patient is a 80 y.o. male with mild anemia.  He comes in today to reassess his hemoglobin.  Of note, past labs showed evidence of a possible hemolysis being present.  Since his last visit, the patient has been doing well.  With respect to his anemia, he denies having increased fatigue or any forms of blood loss.  He also denies having brown-colored urine, which would be concerning for hemolytic anemia being present.    PHYSICAL EXAM:  There were no vitals taken for this visit. Wt Readings from Last 3 Encounters:  07/16/21 183 lb 9.6 oz (83.3 kg)  05/20/21 183 lb 14.4 oz (83.4 kg)  02/17/21 177 lb 14.4 oz (80.7 kg)   There is no height or weight on file to calculate BMI. Performance status (ECOG): 1 - Symptomatic but completely ambulatory Physical Exam Constitutional:      General: He is not in acute distress.    Appearance: Normal appearance. He is normal weight.  HENT:     Head: Normocephalic and atraumatic.  Eyes:     General: No scleral icterus.    Extraocular Movements: Extraocular movements intact.     Conjunctiva/sclera: Conjunctivae normal.     Pupils: Pupils are equal, round, and reactive to light.  Cardiovascular:     Rate and Rhythm: Normal rate and regular rhythm.     Pulses: Normal pulses.     Heart sounds: Normal heart sounds. No murmur heard.   No friction rub. No gallop.  Pulmonary:     Effort: Pulmonary effort is normal. No respiratory distress.     Breath sounds: Normal breath  sounds.  Abdominal:     General: Bowel sounds are normal. There is no distension.     Palpations: Abdomen is soft. There is no hepatomegaly, splenomegaly or mass.     Tenderness: There is no abdominal tenderness.  Musculoskeletal:        General: Normal range of motion.     Cervical back: Normal range of motion and neck supple.     Right lower leg: No edema.     Left lower leg: No edema.  Lymphadenopathy:     Cervical: No cervical adenopathy.  Skin:    General: Skin is warm and dry.  Neurological:     General: No focal deficit present.     Mental Status: He is alert and oriented to person, place, and time. Mental status is at baseline.  Psychiatric:        Mood and Affect: Mood normal.        Behavior: Behavior normal.        Thought Content: Thought content normal.        Judgment: Judgment normal.   LABS:     Ref. Range 05/20/2021 00:00  Sodium Latest Ref Range: 137 - 147  140  Potassium Latest Ref Range: 3.4 - 5.3  4.7  Chloride Latest Ref Range: 99 - 108  106  CO2 Latest Ref Range: 13 - 22  25 (A)  Glucose Unknown  195  BUN Latest Ref Range: 4 - 21  17  Creatinine Latest Ref Range: 0.6 - 1.3  1.1  Calcium Latest Ref Range: 8.7 - 10.7  8.8  Alkaline Phosphatase Latest Ref Range: 25 - 125  68  Albumin Latest Ref Range: 3.5 - 5.0  4.4  AST Latest Ref Range: 14 - 40  54 (A)  ALT Latest Ref Range: 10 - 40  33  Bilirubin, Total Unknown 0.7    Ref. Range 05/20/2021 09:30  LDH Latest Ref Range: 98 - 192 U/L 223 (H)     ASSESSMENT & PLAN:  An 80 y.o. male with mild anemia. I am pleased as his hemoglobin has held stable over the past 4 months. His LDH and haptoglobin levels still suggest hemolysis is taking place, but his stable hemoglobin suggests his marrow is compensating.  There remains no plausible etiology behind his mild hemolysis, but it will continue to be followed conservatively.  Clinically, the patient appears continues to do well.  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 The patient understands all the plans discussed today and is in agreement with them.     I, Rita Ohara, am acting as scribe for Marice Potter, MD    I have reviewed this report as typed by the medical scribe, and it is complete and accurate.  Thomas Macarthur Critchley, MD

## 2021-09-18 ENCOUNTER — Ambulatory Visit: Payer: Medicare Other | Admitting: Oncology

## 2021-09-18 ENCOUNTER — Other Ambulatory Visit: Payer: Medicare Other

## 2021-11-03 ENCOUNTER — Encounter: Payer: Self-pay | Admitting: Cardiovascular Disease

## 2021-11-03 ENCOUNTER — Ambulatory Visit (INDEPENDENT_AMBULATORY_CARE_PROVIDER_SITE_OTHER): Payer: Medicare Other | Admitting: Cardiovascular Disease

## 2021-11-03 ENCOUNTER — Other Ambulatory Visit: Payer: Self-pay

## 2021-11-03 DIAGNOSIS — Z951 Presence of aortocoronary bypass graft: Secondary | ICD-10-CM | POA: Diagnosis not present

## 2021-11-03 DIAGNOSIS — I6523 Occlusion and stenosis of bilateral carotid arteries: Secondary | ICD-10-CM

## 2021-11-03 DIAGNOSIS — E782 Mixed hyperlipidemia: Secondary | ICD-10-CM

## 2021-11-03 DIAGNOSIS — I10 Essential (primary) hypertension: Secondary | ICD-10-CM | POA: Diagnosis not present

## 2021-11-03 NOTE — Assessment & Plan Note (Signed)
History of left carotid endarterectomy performed at the time of CABG in 2011 followed by Dr. [name].  He ultimately underwent right carotid endarterectomy as well.

## 2021-11-03 NOTE — Progress Notes (Signed)
11/03/2021 [name]   1941-06-01  [id number]  Primary Physician Garlon Hatchet Jaymes Graff, MD Primary Cardiologist: [name] MD Garret Reddish, [place], [place]  HPI:  Thomas Gray is a 80 y.o.  moderately overweight the first Caucasian male father of 3 sons, grandfather to 5 granddaughters who was formerly a patient of Dr. Terance Ice. I last saw him  in the office 11/07/2020.  His primary care physician is Dr. [name] in [place]. His cardiac risk factor profile is significant for 50 pack years of tobacco abuse having quit 4 years ago at the time of his coronary artery bypass grafting. History of hypertension and hyperlipidemia. There is no family history. He has never had a heart attack or stroke. He denies chest pain or shortness of breath. He had coronary artery bypass grafting x4 by Dr. Merilynn Finland in 2011 concomitant left carotid endarterectomy performed by Dr. Curt Jews. A Myoview stress test performed in 2013 was nonischemic. He denies chest pain or shortness of breath. He apparently recently had carotid Dopplers 11/27/2018 that showed a widely patent left endarterectomy site with at least moderate to moderately severe right ICA stenosis representing some mild progression.     He exercises at [facility] wellness program at least 5 days a week without symptoms.  His normal blood pressures there are 130/60 with a pulse of 65. Has had mild progression of his carotid artery by duplex ultrasound followed by Dr. .[name].  He had a negative Myoview stress test done for preoperative clearance 06/15/2019 and ultimately underwent elective right carotid endarterectomy by Dr. [name] 2 months later.   When I saw him last he was complaining of some dyspnea and chest pain.  A Myoview stress test performed 05/16/2020 showed anteroapical scar without ischemia which is new since his prior stress test performed 06/15/2019.  Based on this he underwent cardiac catheterization by Dr. Tamala Julian 05/27/2020  revealing patent grafts with an occluded nondominant circumflex and normal LV function.  His hemoglobin at that time was 9.2 which ultimately came up with iron repletion therapy to 11.2 and his symptoms completely resolved.  Since I saw him a year ago he continues to do well.  He exercises 5 days a week for 8-1/2 and is totally asymptomatic.  He denies chest pain or shortness of breath.   Current Meds  Medication Sig   amLODipine (NORVASC) 5 MG tablet TAKE 1 TABLET BY MOUTH EVERY DAY   aspirin EC 81 MG tablet Take 81 mg by mouth every evening.   atorvastatin (LIPITOR) 40 MG tablet Take 1 tablet (40 mg total) by mouth every evening.   cetirizine (ZYRTEC) 10 MG tablet Take 10 mg by mouth daily as needed for allergies or rhinitis.   FARXIGA 10 MG TABS tablet Take 10 mg by mouth every morning.   losartan (COZAAR) 50 MG tablet Take 1 tablet (50 mg total) by mouth daily.   metFORMIN (GLUCOPHAGE) 500 MG tablet Take 500 mg by mouth 2 (two) times daily with a meal.    metoprolol tartrate (LOPRESSOR) 50 MG tablet Take 1 tablet (50 mg total) by mouth 2 (two) times daily.   omeprazole (PRILOSEC) 40 MG capsule TAKE 1 CAPSULE (40 MG TOTAL) BY MOUTH DAILY. KEEP OV.   Probiotic Product ([place]) CAPS Take 1 capsule by mouth daily.     No Known Allergies  Social History   Socioeconomic History   Marital status: Divorced    Spouse name: Not on file  Number of children: 3   Years of education: 12 + 3   Highest education level: Not on file  Occupational History   Occupation: RETIRED    Comment: MEDICAL TECHNICIAN  Tobacco Use   Smoking status: Former    Packs/day: 1.00    Types: Cigarettes    Quit date: 08/07/2010    Years since quitting: 11.2   Smokeless tobacco: Never  Vaping Use   Vaping Use: Never used  Substance and Sexual Activity   Alcohol use: Yes    Comment: 1 glass of wine weekly   Drug use: No   Sexual activity: Not Currently  Other Topics Concern   Not on file   Social History Narrative   Not on file   Social Determinants of Health   Financial Resource Strain: Not on file  Food Insecurity: Not on file  Transportation Needs: Not on file  Physical Activity: Not on file  Stress: Not on file  Social Connections: Not on file  Intimate Partner Violence: Not on file     Review of Systems: General: negative for chills, fever, night sweats or weight changes.  Cardiovascular: negative for chest pain, dyspnea on exertion, edema, orthopnea, palpitations, paroxysmal nocturnal dyspnea or shortness of breath Dermatological: negative for rash Respiratory: negative for cough or wheezing Urologic: negative for hematuria Abdominal: negative for nausea, vomiting, diarrhea, bright red blood per rectum, melena, or hematemesis Neurologic: negative for visual changes, syncope, or dizziness All other systems reviewed and are otherwise negative except as noted above.    Blood pressure (!) 196/76, pulse 68, height 5\' 6"  (1.676 m), weight 173 lb 6.4 oz (78.7 kg), SpO2 98 %.  General appearance: alert and no distress Neck: no adenopathy, no carotid bruit, no JVD, supple, symmetrical, trachea midline, and thyroid not enlarged, symmetric, no tenderness/mass/nodules Lungs: clear to auscultation bilaterally Heart: regular rate and rhythm, S1, S2 normal, no murmur, click, rub or gallop Extremities: extremities normal, atraumatic, no cyanosis or edema Pulses: 2+ and symmetric Skin: Skin color, texture, turgor normal. No rashes or lesions Neurologic: Grossly normal  EKG sinus rhythm at 68 with nonspecific ST and T wave changes.  I personally reviewed this EKG.  ASSESSMENT AND PLAN:   Occlusion and stenosis of carotid artery History of left carotid endarterectomy performed at the time of CABG in 2011 followed by Dr. [name].  He ultimately underwent right carotid endarterectomy as well.  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 Roxan Hockey in 2011.  He  underwent cardiac catheterization by Dr. Tamala Julian 05/27/2020 which revealed patent grafts with an occluded nondominant circumflex and normal LV function.  He is totally asymptomatic.  Essential hypertension, benign History of essential hypertension a blood pressure measured today at 196/76.  He is on amlodipine, metoprolol and losartan.  He says that he takes his blood pressure at home regularly and it usually runs in the 130-140/60 range.  Hyperlipidemia History of hyperlipidemia on statin therapy with lipid profile performed 05/27/2020 revealing total cholesterol 131, LDL 74 and HDL 28.     [name] MD [facility], [facility] 11/03/2021 11:39 AM

## 2021-11-03 NOTE — Assessment & Plan Note (Signed)
History of CAD status post CABG x4 by Dr. Roxan Hockey in 2011.  He underwent cardiac catheterization by Dr. Tamala Julian 05/27/2020 which revealed patent grafts with an occluded nondominant circumflex and normal LV function.  He is totally asymptomatic.

## 2021-11-03 NOTE — Assessment & Plan Note (Signed)
History of hyperlipidemia on statin therapy with lipid profile performed 05/27/2020 revealing total cholesterol 131, LDL 74 and HDL 28.

## 2021-11-03 NOTE — Assessment & Plan Note (Signed)
History of essential hypertension a blood pressure measured today at 196/76.  He is on amlodipine, metoprolol and losartan.  He says that he takes his blood pressure at home regularly and it usually runs in the 130-140/60 range.

## 2021-11-03 NOTE — Patient Instructions (Signed)

## 2022-07-02 ENCOUNTER — Other Ambulatory Visit: Payer: Self-pay | Admitting: Cardiovascular Disease

## 2022-07-21 ENCOUNTER — Other Ambulatory Visit: Payer: Self-pay | Admitting: Cardiovascular Disease

## 2022-07-30 ENCOUNTER — Other Ambulatory Visit: Payer: Self-pay | Admitting: Cardiovascular Disease

## 2022-10-30 ENCOUNTER — Other Ambulatory Visit: Payer: Self-pay | Admitting: Cardiovascular Disease

## 2022-11-03 ENCOUNTER — Other Ambulatory Visit: Payer: Self-pay | Admitting: *Deleted

## 2022-11-03 DIAGNOSIS — I6523 Occlusion and stenosis of bilateral carotid arteries: Secondary | ICD-10-CM

## 2022-11-04 ENCOUNTER — Encounter: Payer: Self-pay | Admitting: Cardiovascular Disease

## 2022-11-04 ENCOUNTER — Ambulatory Visit: Payer: Medicare Other | Attending: Cardiovascular Disease | Admitting: Cardiovascular Disease

## 2022-11-04 VITALS — BP 180/60 | HR 68 | Ht 65.0 in | Wt 172.6 lb

## 2022-11-04 DIAGNOSIS — I10 Essential (primary) hypertension: Secondary | ICD-10-CM | POA: Insufficient documentation

## 2022-11-04 DIAGNOSIS — F17200 Nicotine dependence, unspecified, uncomplicated: Secondary | ICD-10-CM | POA: Insufficient documentation

## 2022-11-04 DIAGNOSIS — Z951 Presence of aortocoronary bypass graft: Secondary | ICD-10-CM | POA: Diagnosis not present

## 2022-11-04 DIAGNOSIS — E782 Mixed hyperlipidemia: Secondary | ICD-10-CM | POA: Diagnosis not present

## 2022-11-04 DIAGNOSIS — I6523 Occlusion and stenosis of bilateral carotid arteries: Secondary | ICD-10-CM | POA: Diagnosis not present

## 2022-11-04 NOTE — Assessment & Plan Note (Addendum)
History of hyperlipidemia on statin therapy with lipid profile performed 05/27/2020 revealing total cholesterol 131, LDL 74 and HDL of 28.  Recent lipid profile performed by his PCP 10/05/2022 revealed total cholesterol 121 with an LDL of 73.

## 2022-11-04 NOTE — Patient Instructions (Signed)
Medication Instructions:  The current medical regimen is effective;  continue present plan and medications.  *If you need a refill on your cardiac medications before your next appointment, please call your pharmacy*   Follow-Up: At [facility], you and your health needs are our priority.  As part of our continuing mission to provide you with exceptional heart care, we have created designated Provider Care Teams.  These Care Teams include your primary Cardiologist (physician) and Advanced Practice Providers (APPs -  Physician Assistants and Nurse Practitioners) who all work together to provide you with the care you need, when you need it.  We recommend signing up for the patient portal called "MyChart".  Sign up information is provided on this After Visit Summary.  MyChart is used to connect with patients for Virtual Visits (Telemedicine).  Patients are able to view lab/test results, encounter notes, upcoming appointments, etc.  Non-urgent messages can be sent to your provider as well.   To learn more about what you can do with MyChart, go to [email or web address].    Your next appointment:   12 month(s)  The format for your next appointment:   In Person  Provider:   Quay Burow, MD     Take BP at home- come back and see PHARMD in 1 month. They will contact you for an appointment.

## 2022-11-04 NOTE — Assessment & Plan Note (Signed)
CAD status post bypass grafting x 4 by Dr. Roxan Hockey in 2011.  Catheterization performed by Dr. Tamala Julian 05/27/2020 revealed patent grafts with occluded nondominant circumflex and normal LV function.  He is very active and is asymptomatic.

## 2022-11-04 NOTE — Assessment & Plan Note (Signed)
History of essential hypertension a blood pressure measured today at 180/60.  He is on amlodipine and losartan.  He checks his blood pressure at home it usually runs in the 1 30-1 40 range over 60.  I asked him to keep a 30-day blood pressure log and to come back in 4 weeks to review this with a Pharm.D. and make appropriate changes as necessary.

## 2022-11-04 NOTE — Assessment & Plan Note (Signed)
History of carotid artery disease status post bilateral carotid endarterectomies by Dr. [name] who he follows by duplex ultrasound.

## 2022-11-04 NOTE — Assessment & Plan Note (Signed)
History of disc to tobacco abuse 12 years ago with COPD.

## 2022-11-04 NOTE — Progress Notes (Signed)
11/04/2022 [name]   07-31-1941  [id number]  Primary Physician Garlon Hatchet Jaymes Graff, MD Primary Cardiologist: [name] MD Garret Reddish, [place], [place]  HPI:  Thomas Gray is a 81 y.o.  moderately overweight the first Caucasian male father of 3 sons, grandfather to 5 granddaughters who was formerly a patient of Dr. Terance Ice. I last saw him  in the office 11/03/2021.  His primary care physician is Dr. [name] in [place]. His cardiac risk factor profile is significant for 50 pack years of tobacco abuse having quit 4 years ago at the time of his coronary artery bypass grafting. History of hypertension and hyperlipidemia. There is no family history. He has never had a heart attack or stroke. He denies chest pain or shortness of breath. He had coronary artery bypass grafting x4 by Dr. Merilynn Finland in 2011 concomitant left carotid endarterectomy performed by Dr. Curt Jews. A Myoview stress test performed in 2013 was nonischemic. He denies chest pain or shortness of breath. He apparently recently had carotid Dopplers 11/27/2018 that showed a widely patent left endarterectomy site with at least moderate to moderately severe right ICA stenosis representing some mild progression.     He exercises at [facility] wellness program at least 5 days a week without symptoms.  His normal blood pressures there are 130/60 with a pulse of 65. Has had mild progression of his carotid artery by duplex ultrasound followed by Dr. .[name].  He had a negative Myoview stress test done for preoperative clearance 06/15/2019 and ultimately underwent elective right carotid endarterectomy by Dr. [name] 2 months later.   When I saw him last he was complaining of some dyspnea and chest pain.  A Myoview stress test performed 05/16/2020 showed anteroapical scar without ischemia which is new since his prior stress test performed 06/15/2019.  Based on this he underwent cardiac catheterization by Dr. Tamala Julian 05/27/2020  revealing patent grafts with an occluded nondominant circumflex and normal LV function.  His hemoglobin at that time was 9.2 which ultimately came up with iron repletion therapy to 11.2 and his symptoms completely resolved.  Since I saw him a year ago he continues to do well.  He exercises 5 days a week and is totally asymptomatic.  He denies chest pain or shortness of breath.     Current Meds  Medication Sig   amLODipine (NORVASC) 5 MG tablet TAKE 1 TABLET BY MOUTH EVERY DAY   aspirin EC 81 MG tablet Take 81 mg by mouth every evening.   atorvastatin (LIPITOR) 40 MG tablet Take 1 tablet (40 mg total) by mouth every evening.   cetirizine (ZYRTEC) 10 MG tablet Take 10 mg by mouth daily as needed for allergies or rhinitis.   Difluprednate 0.05 % EMUL Apply to eye.   FARXIGA 10 MG TABS tablet Take 10 mg by mouth every morning.   losartan (COZAAR) 50 MG tablet TAKE 1 TABLET BY MOUTH EVERY DAY   metFORMIN (GLUCOPHAGE) 500 MG tablet Take 500 mg by mouth 2 (two) times daily with a meal.    metoprolol tartrate (LOPRESSOR) 50 MG tablet TAKE 1 TABLET BY MOUTH TWICE A DAY   ofloxacin (OCUFLOX) 0.3 % ophthalmic solution PLEASE SEE ATTACHED FOR DETAILED DIRECTIONS   omeprazole (PRILOSEC) 40 MG capsule TAKE 1 CAPSULE (40 MG TOTAL) BY MOUTH DAILY. KEEP OV.   Probiotic Product ([place]) CAPS Take 1 capsule by mouth daily.     No Known Allergies  Social History  Socioeconomic History   Marital status: Divorced    Spouse name: Not on file   Number of children: 3   Years of education: 69 + 3   Highest education level: Not on file  Occupational History   Occupation: RETIRED    Comment: MEDICAL TECHNICIAN  Tobacco Use   Smoking status: Former    Packs/day: 1.00    Types: Cigarettes    Quit date: 08/07/2010    Years since quitting: 12.2   Smokeless tobacco: Never  Vaping Use   Vaping Use: Never used  Substance and Sexual Activity   Alcohol use: Yes    Comment: 1 glass of wine  weekly   Drug use: No   Sexual activity: Not Currently  Other Topics Concern   Not on file  Social History Narrative   Not on file   Social Determinants of Health   Financial Resource Strain: Not on file  Food Insecurity: Not on file  Transportation Needs: Not on file  Physical Activity: Not on file  Stress: Not on file  Social Connections: Not on file  Intimate Partner Violence: Not on file     Review of Systems: General: negative for chills, fever, night sweats or weight changes.  Cardiovascular: negative for chest pain, dyspnea on exertion, edema, orthopnea, palpitations, paroxysmal nocturnal dyspnea or shortness of breath Dermatological: negative for rash Respiratory: negative for cough or wheezing Urologic: negative for hematuria Abdominal: negative for nausea, vomiting, diarrhea, bright red blood per rectum, melena, or hematemesis Neurologic: negative for visual changes, syncope, or dizziness All other systems reviewed and are otherwise negative except as noted above.    Blood pressure (!) 180/60, pulse 68, height '5\' 5"'$  (1.651 m), weight 172 lb 9.6 oz (78.3 kg), SpO2 97 %.  General appearance: alert and no distress Neck: no adenopathy, no carotid bruit, no JVD, supple, symmetrical, trachea midline, and thyroid not enlarged, symmetric, no tenderness/mass/nodules Lungs: clear to auscultation bilaterally Heart: regular rate and rhythm, S1, S2 normal, no murmur, click, rub or gallop Extremities: extremities normal, atraumatic, no cyanosis or edema Pulses: 2+ and symmetric Skin: Skin color, texture, turgor normal. No rashes or lesions Neurologic: Grossly normal  EKG sinus rhythm at 68 without ST or T wave changes.  I personally reviewed this EKG.  ASSESSMENT AND PLAN:   Occlusion and stenosis of carotid artery History of carotid artery disease status post bilateral carotid endarterectomies by Dr. [name] who he follows by duplex ultrasound.  S/P CABG x 4 CAD status  post bypass grafting x 4 by Dr. Roxan Hockey in 2011.  Catheterization performed by Dr. Tamala Julian 05/27/2020 revealed patent grafts with occluded nondominant circumflex and normal LV function.  He is very active and is asymptomatic.  Essential hypertension, benign History of essential hypertension a blood pressure measured today at 180/60.  He is on amlodipine and losartan.  He checks his blood pressure at home it usually runs in the 1 30-1 40 range over 60.  I asked him to keep a 30-day blood pressure log and to come back in 4 weeks to review this with a Pharm.D. and make appropriate changes as necessary.  Hyperlipidemia History of hyperlipidemia on statin therapy with lipid profile performed 05/27/2020 revealing total cholesterol 131, LDL 74 and HDL of 28.  Tobacco use disorder History of disc to tobacco abuse 12 years ago with COPD.     [name] MD FACP,FACC,FAHA, [facility] 11/04/2022 11:50 AM

## 2022-11-12 ENCOUNTER — Ambulatory Visit: Payer: Medicare Other

## 2022-11-12 ENCOUNTER — Encounter (HOSPITAL_COMMUNITY): Payer: Medicare Other

## 2022-12-10 ENCOUNTER — Ambulatory Visit: Payer: Medicare Other

## 2022-12-21 ENCOUNTER — Ambulatory Visit: Payer: Medicare Other | Admitting: Cardiovascular Disease

## 2022-12-25 ENCOUNTER — Other Ambulatory Visit: Payer: Self-pay | Admitting: Cardiovascular Disease

## 2023-04-24 ENCOUNTER — Other Ambulatory Visit: Payer: Self-pay | Admitting: Cardiovascular Disease

## 2023-06-19 ENCOUNTER — Other Ambulatory Visit: Payer: Self-pay | Admitting: Cardiovascular Disease

## 2023-06-21 ENCOUNTER — Other Ambulatory Visit: Payer: Self-pay | Admitting: *Deleted

## 2023-06-21 DIAGNOSIS — M79605 Pain in left leg: Secondary | ICD-10-CM

## 2023-07-09 ENCOUNTER — Other Ambulatory Visit: Payer: Self-pay | Admitting: Cardiovascular Disease

## 2023-07-13 ENCOUNTER — Encounter: Payer: Self-pay | Admitting: Vascular Surgery

## 2023-07-13 ENCOUNTER — Ambulatory Visit (HOSPITAL_COMMUNITY)
Admission: RE | Admit: 2023-07-13 | Discharge: 2023-07-13 | Disposition: A | Discharge status: Home / Self Care | Payer: Medicare Other | Source: Ambulatory Visit | Attending: Vascular Surgery | Admitting: Vascular Surgery

## 2023-07-13 ENCOUNTER — Ambulatory Visit: Payer: Medicare Other | Admitting: Vascular Surgery

## 2023-07-13 VITALS — BP 204/74 | HR 80 | Temp 97.9°F | Resp 18 | Ht 65.0 in | Wt 168.8 lb

## 2023-07-13 DIAGNOSIS — I890 Lymphedema, not elsewhere classified: Secondary | ICD-10-CM | POA: Diagnosis not present

## 2023-07-13 DIAGNOSIS — I872 Venous insufficiency (chronic) (peripheral): Secondary | ICD-10-CM | POA: Diagnosis not present

## 2023-07-13 DIAGNOSIS — M79605 Pain in left leg: Secondary | ICD-10-CM | POA: Diagnosis not present

## 2023-07-13 DIAGNOSIS — M79604 Pain in right leg: Secondary | ICD-10-CM | POA: Insufficient documentation

## 2023-07-13 NOTE — Progress Notes (Signed)
ASSESSMENT & PLAN   COMBINED CHRONIC VENOUS INSUFFICIENCY AND LYMPHEDEMA: Based on his exam I think he has evidence of combined chronic venous insufficiency and lymphedema.  I have explained that the treatment for these is exactly the same.  I have encouraged him to avoid prolonged sitting and standing.  We have discussed importance of exercise specifically walking and water aerobics.  He will continue to wear his knee-high compression stockings.  It sounds like he needs to get a pair that fits better however.  We have also discussed the importance of daily leg elevation and the proper positioning for this.  In addition we have discussed the importance of maintaining a healthy weight.  Based on his venous duplex study he is not a candidate for laser ablation of his saphenous vein on the right as the vein only has a short segment of reflux and is not significantly dilated.  If his swelling worsens the only other consideration would be a CT venogram to rule out a proximal obstruction that is contributing to his venous hypertension.  His venous waveforms were somewhat pulsatile suggesting he may have some mild underlying congestive heart failure.   REASON FOR CONSULT:    Bilateral leg swelling.  Right worse than left.  The consult is requested by Dr. Dina Rich.   HPI:   Thomas Gray is a 82 y.o. male who was referred with bilateral lower extremity swelling.  The patient states that he had swelling in both legs for several years.  He did undergo previous coronary revascularization and vein was taken from the right leg.  Has had more significant swelling on the right ever since that surgery.  He has no previous history of DVT.  Has had no previous venous procedures.  The swelling is warm worse at the end of the day and is best when he wakes up in the morning.  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  He denies any history of congestive heart failure, chest pain, or significant  shortness of breath with exertion.  Past Medical History:  Diagnosis Date   Anemia    CAD (coronary artery disease)    12/29/2011 normal R/S nuclear study;08/05/2010 cath smooth 50% ostial left main 60% distal left main, 60% ca+ smooth prox LAD, 70% ramus intermed., 90% prox. near ostial, 80% prox. CX, diffuse 30%prox. 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 Early and Hendrickson    Family History  Problem Relation Age of Onset   Hypertension Mother    Diabetes Mother    Heart disease Mother        After age 22   Tuberculosis Father     SOCIAL HISTORY: Social History   Tobacco Use   Smoking status: Former    Current packs/day: 0.00    Types: Cigarettes    Quit date: 08/07/2010    Years since quitting: 12.9   Smokeless tobacco: Never  Substance Use Topics   Alcohol use: Yes    Comment:  1 glass of wine weekly    No Known Allergies  Current Outpatient Medications  Medication Sig Dispense Refill   amLODipine (NORVASC) 5 MG tablet TAKE 1 TABLET BY MOUTH EVERY DAY 30 tablet 0   aspirin EC 81 MG tablet Take 81 mg by mouth every evening.     atorvastatin (LIPITOR) 40 MG tablet Take 1 tablet (40 mg total) by mouth every evening. 30 tablet 2   cetirizine (ZYRTEC) 10 MG tablet Take 10 mg by mouth daily as needed for allergies or rhinitis.     Difluprednate 0.05 % EMUL Apply to eye.     doxycycline (ADOXA) 50 MG tablet Take 50 mg by mouth 2 (two) times daily.     FARXIGA 10 MG TABS tablet Take 10 mg by mouth every morning.     losartan (COZAAR) 50 MG tablet TAKE  1 TABLET BY MOUTH EVERY DAY 90 tablet 0   metFORMIN (GLUCOPHAGE) 500 MG tablet Take 500 mg by mouth 2 (two) times daily with a meal.   3   metoprolol tartrate (LOPRESSOR) 50 MG tablet TAKE 1 TABLET BY MOUTH TWICE A DAY 180 tablet 0   ofloxacin (OCUFLOX) 0.3 % ophthalmic solution PLEASE SEE ATTACHED FOR DETAILED DIRECTIONS     omeprazole (PRILOSEC) 40 MG capsule TAKE 1 CAPSULE (40 MG TOTAL) BY MOUTH DAILY. KEEP OV. 90 capsule 3   Probiotic Product ([facility]) CAPS Take 1 capsule by mouth daily.     No current facility-administered medications for this visit.    REVIEW OF SYSTEMS:  [X]  denotes positive finding, [ ]  denotes negative finding Cardiac  Comments:  Chest pain or chest pressure:    Shortness of breath upon exertion:    Short of breath when lying flat:    Irregular heart rhythm:        Vascular    Pain in calf, thigh, or hip brought on by ambulation:    Pain in feet at night that wakes you up from your sleep:     Blood clot in your veins:    Leg swelling:         Pulmonary    Oxygen at home:    Productive cough:     Wheezing:         Neurologic    Sudden weakness in arms or legs:     Sudden numbness in arms or legs:     Sudden onset of difficulty speaking or slurred speech:    Temporary loss of vision in one eye:     Problems with dizziness:         Gastrointestinal    Blood in stool:     Vomited blood:         Genitourinary    Burning when urinating:     Blood in urine:        Psychiatric    Major depression:         Hematologic    Bleeding problems:    Problems with blood clotting too easily:        Skin    Rashes or ulcers:        Constitutional    Fever or chills:    -  PHYSICAL EXAM:   Vitals:   07/13/23 1342  BP: (!) 204/74  Pulse: 80  Resp: 18  Temp: 97.9 F (36.6 C)  TempSrc: Temporal  SpO2: 94%  Weight: 168 lb 12.8 oz (76.6 kg)  Height: 5\' 5"  (1.651 m)   Body  mass index is 28.09 kg/m. GENERAL: The patient is a  well-nourished male, in no acute distress. The vital signs are documented above. CARDIAC: There is a regular rate and rhythm.  VASCULAR: I do not detect carotid bruits. He has palpable femoral pulses. Is a biphasic dorsalis pedis and posterior tibial signal bilaterally. PULMONARY: There is good air exchange bilaterally without wheezing or rales. ABDOMEN: Soft and non-tender with normal pitched bowel sounds.  MUSCULOSKELETAL: There are no major deformities. NEUROLOGIC: No focal weakness or paresthesias are detected. SKIN: There are no ulcers or rashes noted. PSYCHIATRIC: The patient has a normal affect.  DATA:    VENOUS DUPLEX: I have independently interpreted his venous duplex scan today.  This was of the right lower extremity only.  There was no evidence of DVT.  There was no significant deep venous reflux.  There was superficial venous reflux in the right great saphenous vein from the distal thigh to the proximal calf however the vein was not dilated.  Diameters ranged from 2.6-3.5 mm.  Waverly Ferrari [facility]

## 2023-07-14 ENCOUNTER — Other Ambulatory Visit: Payer: Self-pay | Admitting: Cardiology

## 2023-07-22 ENCOUNTER — Other Ambulatory Visit: Payer: Self-pay | Admitting: Cardiovascular Disease

## 2023-08-04 ENCOUNTER — Other Ambulatory Visit: Payer: Self-pay | Admitting: Cardiology

## 2023-08-04 ENCOUNTER — Other Ambulatory Visit: Payer: Self-pay | Admitting: Cardiovascular Disease

## 2023-08-10 ENCOUNTER — Other Ambulatory Visit: Payer: Self-pay | Admitting: Cardiovascular Disease

## 2023-08-11 MED ORDER — LOSARTAN POTASSIUM 50 MG PO TABS: 50.0000 mg | ORAL_TABLET | Freq: Every day | ORAL | 0 refills | Status: DC

## 2023-08-25 ENCOUNTER — Encounter (HOSPITAL_COMMUNITY): Payer: Self-pay | Admitting: Student

## 2023-08-25 ENCOUNTER — Encounter (HOSPITAL_COMMUNITY): Payer: Self-pay

## 2023-08-25 ENCOUNTER — Other Ambulatory Visit: Payer: Self-pay

## 2023-08-25 ENCOUNTER — Inpatient Hospital Stay (HOSPITAL_COMMUNITY): Payer: Medicare Other

## 2023-08-25 ENCOUNTER — Inpatient Hospital Stay (HOSPITAL_COMMUNITY)
Admission: AD | Admit: 2023-08-25 | Discharge: 2023-09-02 | DRG: 177 | Disposition: A | Discharge status: Home / Self Care | Payer: Medicare Other | Source: Other Acute Inpatient Hospital | Attending: Internal Medicine | Admitting: Internal Medicine

## 2023-08-25 DIAGNOSIS — I739 Peripheral vascular disease, unspecified: Secondary | ICD-10-CM | POA: Diagnosis present

## 2023-08-25 DIAGNOSIS — I2510 Atherosclerotic heart disease of native coronary artery without angina pectoris: Secondary | ICD-10-CM | POA: Diagnosis present

## 2023-08-25 DIAGNOSIS — I272 Pulmonary hypertension, unspecified: Secondary | ICD-10-CM | POA: Diagnosis present

## 2023-08-25 DIAGNOSIS — I10 Essential (primary) hypertension: Secondary | ICD-10-CM | POA: Diagnosis present

## 2023-08-25 DIAGNOSIS — E1151 Type 2 diabetes mellitus with diabetic peripheral angiopathy without gangrene: Secondary | ICD-10-CM | POA: Diagnosis present

## 2023-08-25 DIAGNOSIS — Z8249 Family history of ischemic heart disease and other diseases of the circulatory system: Secondary | ICD-10-CM

## 2023-08-25 DIAGNOSIS — I130 Hypertensive heart and chronic kidney disease with heart failure and stage 1 through stage 4 chronic kidney disease, or unspecified chronic kidney disease: Secondary | ICD-10-CM | POA: Diagnosis present

## 2023-08-25 DIAGNOSIS — R0602 Shortness of breath: Secondary | ICD-10-CM

## 2023-08-25 DIAGNOSIS — I2489 Other forms of acute ischemic heart disease: Secondary | ICD-10-CM | POA: Diagnosis not present

## 2023-08-25 DIAGNOSIS — Z831 Family history of other infectious and parasitic diseases: Secondary | ICD-10-CM

## 2023-08-25 DIAGNOSIS — I872 Venous insufficiency (chronic) (peripheral): Secondary | ICD-10-CM | POA: Diagnosis present

## 2023-08-25 DIAGNOSIS — I21A1 Myocardial infarction type 2: Secondary | ICD-10-CM | POA: Diagnosis present

## 2023-08-25 DIAGNOSIS — Z7984 Long term (current) use of oral hypoglycemic drugs: Secondary | ICD-10-CM

## 2023-08-25 DIAGNOSIS — I440 Atrioventricular block, first degree: Secondary | ICD-10-CM | POA: Diagnosis present

## 2023-08-25 DIAGNOSIS — I890 Lymphedema, not elsewhere classified: Secondary | ICD-10-CM | POA: Diagnosis present

## 2023-08-25 DIAGNOSIS — R7989 Other specified abnormal findings of blood chemistry: Secondary | ICD-10-CM

## 2023-08-25 DIAGNOSIS — J1282 Pneumonia due to coronavirus disease 2019: Secondary | ICD-10-CM | POA: Diagnosis present

## 2023-08-25 DIAGNOSIS — N183 Chronic kidney disease, stage 3 unspecified: Secondary | ICD-10-CM | POA: Diagnosis present

## 2023-08-25 DIAGNOSIS — I2723 Pulmonary hypertension due to lung diseases and hypoxia: Secondary | ICD-10-CM | POA: Diagnosis not present

## 2023-08-25 DIAGNOSIS — E871 Hypo-osmolality and hyponatremia: Secondary | ICD-10-CM | POA: Diagnosis not present

## 2023-08-25 DIAGNOSIS — J9601 Acute respiratory failure with hypoxia: Secondary | ICD-10-CM | POA: Diagnosis present

## 2023-08-25 DIAGNOSIS — Z79899 Other long term (current) drug therapy: Secondary | ICD-10-CM

## 2023-08-25 DIAGNOSIS — U071 COVID-19: Principal | ICD-10-CM | POA: Diagnosis present

## 2023-08-25 DIAGNOSIS — L930 Discoid lupus erythematosus: Secondary | ICD-10-CM | POA: Diagnosis present

## 2023-08-25 DIAGNOSIS — D631 Anemia in chronic kidney disease: Secondary | ICD-10-CM | POA: Diagnosis present

## 2023-08-25 DIAGNOSIS — I779 Disorder of arteries and arterioles, unspecified: Secondary | ICD-10-CM | POA: Diagnosis present

## 2023-08-25 DIAGNOSIS — Z602 Problems related to living alone: Secondary | ICD-10-CM | POA: Diagnosis present

## 2023-08-25 DIAGNOSIS — E119 Type 2 diabetes mellitus without complications: Secondary | ICD-10-CM

## 2023-08-25 DIAGNOSIS — I081 Rheumatic disorders of both mitral and tricuspid valves: Secondary | ICD-10-CM | POA: Diagnosis present

## 2023-08-25 DIAGNOSIS — Z87891 Personal history of nicotine dependence: Secondary | ICD-10-CM

## 2023-08-25 DIAGNOSIS — E1122 Type 2 diabetes mellitus with diabetic chronic kidney disease: Secondary | ICD-10-CM | POA: Diagnosis present

## 2023-08-25 DIAGNOSIS — E872 Acidosis, unspecified: Secondary | ICD-10-CM | POA: Diagnosis present

## 2023-08-25 DIAGNOSIS — K219 Gastro-esophageal reflux disease without esophagitis: Secondary | ICD-10-CM | POA: Diagnosis present

## 2023-08-25 DIAGNOSIS — E1165 Type 2 diabetes mellitus with hyperglycemia: Secondary | ICD-10-CM | POA: Diagnosis present

## 2023-08-25 DIAGNOSIS — J440 Chronic obstructive pulmonary disease with acute lower respiratory infection: Secondary | ICD-10-CM | POA: Diagnosis present

## 2023-08-25 DIAGNOSIS — E1169 Type 2 diabetes mellitus with other specified complication: Secondary | ICD-10-CM | POA: Diagnosis present

## 2023-08-25 DIAGNOSIS — Z951 Presence of aortocoronary bypass graft: Secondary | ICD-10-CM | POA: Diagnosis not present

## 2023-08-25 DIAGNOSIS — I6523 Occlusion and stenosis of bilateral carotid arteries: Secondary | ICD-10-CM

## 2023-08-25 DIAGNOSIS — R011 Cardiac murmur, unspecified: Secondary | ICD-10-CM | POA: Diagnosis present

## 2023-08-25 DIAGNOSIS — Z833 Family history of diabetes mellitus: Secondary | ICD-10-CM

## 2023-08-25 DIAGNOSIS — N179 Acute kidney failure, unspecified: Secondary | ICD-10-CM | POA: Diagnosis present

## 2023-08-25 DIAGNOSIS — I5033 Acute on chronic diastolic (congestive) heart failure: Secondary | ICD-10-CM | POA: Diagnosis present

## 2023-08-25 DIAGNOSIS — E785 Hyperlipidemia, unspecified: Secondary | ICD-10-CM | POA: Diagnosis present

## 2023-08-25 DIAGNOSIS — N1832 Chronic kidney disease, stage 3b: Secondary | ICD-10-CM | POA: Diagnosis not present

## 2023-08-25 DIAGNOSIS — I878 Other specified disorders of veins: Secondary | ICD-10-CM | POA: Diagnosis present

## 2023-08-25 DIAGNOSIS — N1831 Chronic kidney disease, stage 3a: Secondary | ICD-10-CM | POA: Diagnosis present

## 2023-08-25 DIAGNOSIS — Z9861 Coronary angioplasty status: Secondary | ICD-10-CM

## 2023-08-25 DIAGNOSIS — Z9089 Acquired absence of other organs: Secondary | ICD-10-CM

## 2023-08-25 DIAGNOSIS — Z9889 Other specified postprocedural states: Secondary | ICD-10-CM

## 2023-08-25 DIAGNOSIS — Z7982 Long term (current) use of aspirin: Secondary | ICD-10-CM

## 2023-08-25 DIAGNOSIS — Z9049 Acquired absence of other specified parts of digestive tract: Secondary | ICD-10-CM

## 2023-08-25 DIAGNOSIS — J441 Chronic obstructive pulmonary disease with (acute) exacerbation: Secondary | ICD-10-CM | POA: Diagnosis present

## 2023-08-25 DIAGNOSIS — I5031 Acute diastolic (congestive) heart failure: Secondary | ICD-10-CM | POA: Diagnosis not present

## 2023-08-25 DIAGNOSIS — I214 Non-ST elevation (NSTEMI) myocardial infarction: Secondary | ICD-10-CM | POA: Diagnosis not present

## 2023-08-25 DIAGNOSIS — Z789 Other specified health status: Secondary | ICD-10-CM

## 2023-08-25 DIAGNOSIS — J984 Other disorders of lung: Secondary | ICD-10-CM | POA: Diagnosis present

## 2023-08-25 DIAGNOSIS — U099 Post covid-19 condition, unspecified: Secondary | ICD-10-CM | POA: Diagnosis not present

## 2023-08-25 LAB — HEMOGLOBIN A1C
Hgb A1c MFr Bld: 7.3 % — ABNORMAL HIGH (ref 4.8–5.6)
Mean Plasma Glucose: 162.81 mg/dL

## 2023-08-25 LAB — HEPARIN LEVEL (UNFRACTIONATED)
Heparin Unfractionated: 0.55 [IU]/mL (ref 0.30–0.70)
Heparin Unfractionated: 0.61 [IU]/mL (ref 0.30–0.70)

## 2023-08-25 LAB — TROPONIN I (HIGH SENSITIVITY)
Troponin I (High Sensitivity): 2532 ng/L (ref <18)
Troponin I (High Sensitivity): 3072 ng/L (ref <18)

## 2023-08-25 LAB — CBC
HCT: 28.3 % — ABNORMAL LOW (ref 39.0–52.0)
Hemoglobin: 8.8 g/dL — ABNORMAL LOW (ref 13.0–17.0)
MCH: 25.9 pg — ABNORMAL LOW (ref 26.0–34.0)
MCHC: 31.1 g/dL (ref 30.0–36.0)
MCV: 83.2 fL (ref 80.0–100.0)
Platelets: 263 10*3/uL (ref 150–400)
RBC: 3.4 MIL/uL — ABNORMAL LOW (ref 4.22–5.81)
RDW: 25 % — ABNORMAL HIGH (ref 11.5–15.5)
WBC: 7.4 10*3/uL (ref 4.0–10.5)
nRBC: 0.3 % — ABNORMAL HIGH (ref 0.0–0.2)

## 2023-08-25 LAB — APTT: aPTT: 142 s — ABNORMAL HIGH (ref 24–36)

## 2023-08-25 LAB — PHOSPHORUS: Phosphorus: 2.7 mg/dL (ref 2.5–4.6)

## 2023-08-25 LAB — COMPREHENSIVE METABOLIC PANEL
ALT: 30 U/L (ref 0–44)
AST: 81 U/L — ABNORMAL HIGH (ref 15–41)
Albumin: 3.3 g/dL — ABNORMAL LOW (ref 3.5–5.0)
Alkaline Phosphatase: 48 U/L (ref 38–126)
Anion gap: 15 (ref 5–15)
BUN: 20 mg/dL (ref 8–23)
CO2: 18 mmol/L — ABNORMAL LOW (ref 22–32)
Calcium: 8.2 mg/dL — ABNORMAL LOW (ref 8.9–10.3)
Chloride: 104 mmol/L (ref 98–111)
Creatinine, Ser: 1.65 mg/dL — ABNORMAL HIGH (ref 0.61–1.24)
GFR, Estimated: 41 mL/min — ABNORMAL LOW (ref >60)
Glucose, Bld: 258 mg/dL — ABNORMAL HIGH (ref 70–99)
Potassium: 4 mmol/L (ref 3.5–5.1)
Sodium: 137 mmol/L (ref 135–145)
Total Bilirubin: 0.6 mg/dL (ref 0.3–1.2)
Total Protein: 6.9 g/dL (ref 6.5–8.1)

## 2023-08-25 LAB — PROTIME-INR
INR: 1.2 (ref 0.8–1.2)
Prothrombin Time: 15.4 s — ABNORMAL HIGH (ref 11.4–15.2)

## 2023-08-25 LAB — GLUCOSE, CAPILLARY
Glucose-Capillary: 257 mg/dL — ABNORMAL HIGH (ref 70–99)
Glucose-Capillary: 279 mg/dL — ABNORMAL HIGH (ref 70–99)

## 2023-08-25 LAB — PROCALCITONIN: Procalcitonin: 0.37 ng/mL

## 2023-08-25 LAB — SARS CORONAVIRUS 2 BY RT PCR: SARS Coronavirus 2 by RT PCR: POSITIVE — AB

## 2023-08-25 LAB — MAGNESIUM: Magnesium: 1.7 mg/dL (ref 1.7–2.4)

## 2023-08-25 MED ORDER — INSULIN ASPART 100 UNIT/ML IJ SOLN
0.0000 [IU] | Freq: Every day | INTRAMUSCULAR | Status: DC
  Administered 2023-08-25: 3 [IU] via SUBCUTANEOUS
  Administered 2023-08-29 – 2023-08-30 (×2): 2 [IU] via SUBCUTANEOUS
  Administered 2023-09-01: 3 [IU] via SUBCUTANEOUS

## 2023-08-25 MED ORDER — ACETAMINOPHEN 650 MG RE SUPP: 650.0000 mg | Freq: Four times a day (QID) | RECTAL | Status: DC | PRN

## 2023-08-25 MED ORDER — HEPARIN (PORCINE) 25000 UT/250ML-% IV SOLN
800.0000 [IU]/h | INTRAVENOUS | Status: DC
  Administered 2023-08-25 – 2023-08-26 (×2): 800 [IU]/h via INTRAVENOUS
  Filled 2023-08-25: qty 250

## 2023-08-25 MED ORDER — FUROSEMIDE 10 MG/ML IJ SOLN
40.0000 mg | Freq: Once | INTRAMUSCULAR | Status: AC
  Administered 2023-08-25: 40 mg via INTRAVENOUS
  Filled 2023-08-25: qty 4

## 2023-08-25 MED ORDER — METOPROLOL TARTRATE 50 MG PO TABS: 50.0000 mg | ORAL_TABLET | Freq: Two times a day (BID) | ORAL | Status: DC

## 2023-08-25 MED ORDER — BUDESONIDE 0.25 MG/2ML IN SUSP
0.2500 mg | Freq: Two times a day (BID) | RESPIRATORY_TRACT | Status: DC
  Administered 2023-08-25 – 2023-09-02 (×15): 0.25 mg via RESPIRATORY_TRACT
  Filled 2023-08-25 (×15): qty 2

## 2023-08-25 MED ORDER — METOPROLOL TARTRATE 25 MG PO TABS
25.0000 mg | ORAL_TABLET | Freq: Three times a day (TID) | ORAL | Status: DC
  Administered 2023-08-25 – 2023-08-31 (×17): 25 mg via ORAL
  Filled 2023-08-25 (×18): qty 1

## 2023-08-25 MED ORDER — HYDRALAZINE HCL 20 MG/ML IJ SOLN: 5.0000 mg | INTRAMUSCULAR | Status: DC | PRN

## 2023-08-25 MED ORDER — PREDNISONE 20 MG PO TABS
50.0000 mg | ORAL_TABLET | Freq: Every day | ORAL | Status: DC
  Administered 2023-08-28 – 2023-09-01 (×5): 50 mg via ORAL
  Filled 2023-08-25 (×5): qty 1

## 2023-08-25 MED ORDER — ATORVASTATIN CALCIUM 40 MG PO TABS
40.0000 mg | ORAL_TABLET | Freq: Every evening | ORAL | Status: DC
  Administered 2023-08-26 – 2023-09-01 (×7): 40 mg via ORAL
  Filled 2023-08-25 (×7): qty 1

## 2023-08-25 MED ORDER — IPRATROPIUM-ALBUTEROL 0.5-2.5 (3) MG/3ML IN SOLN
3.0000 mL | Freq: Four times a day (QID) | RESPIRATORY_TRACT | Status: DC
  Administered 2023-08-25 – 2023-08-26 (×3): 3 mL via RESPIRATORY_TRACT
  Filled 2023-08-25 (×3): qty 3

## 2023-08-25 MED ORDER — ACETAMINOPHEN 325 MG PO TABS
650.0000 mg | ORAL_TABLET | Freq: Four times a day (QID) | ORAL | Status: DC | PRN
  Administered 2023-08-29 – 2023-08-30 (×2): 650 mg via ORAL
  Filled 2023-08-25 (×2): qty 2

## 2023-08-25 MED ORDER — LOSARTAN POTASSIUM 50 MG PO TABS: 50.0000 mg | ORAL_TABLET | Freq: Every day | ORAL | Status: DC

## 2023-08-25 MED ORDER — INSULIN ASPART 100 UNIT/ML IJ SOLN
0.0000 [IU] | Freq: Three times a day (TID) | INTRAMUSCULAR | Status: DC
  Administered 2023-08-25: 8 [IU] via SUBCUTANEOUS
  Administered 2023-08-26 (×2): 3 [IU] via SUBCUTANEOUS
  Administered 2023-08-26: 5 [IU] via SUBCUTANEOUS
  Administered 2023-08-27: 8 [IU] via SUBCUTANEOUS
  Administered 2023-08-27: 3 [IU] via SUBCUTANEOUS
  Administered 2023-08-27 – 2023-08-28 (×2): 5 [IU] via SUBCUTANEOUS
  Administered 2023-08-28: 11 [IU] via SUBCUTANEOUS
  Administered 2023-08-28 – 2023-08-29 (×2): 5 [IU] via SUBCUTANEOUS
  Administered 2023-08-29: 2 [IU] via SUBCUTANEOUS
  Administered 2023-08-29: 3 [IU] via SUBCUTANEOUS
  Administered 2023-08-30: 5 [IU] via SUBCUTANEOUS
  Administered 2023-08-30 – 2023-08-31 (×2): 2 [IU] via SUBCUTANEOUS
  Administered 2023-08-31 – 2023-09-01 (×4): 3 [IU] via SUBCUTANEOUS

## 2023-08-25 MED ORDER — ARFORMOTEROL TARTRATE 15 MCG/2ML IN NEBU
15.0000 ug | INHALATION_SOLUTION | Freq: Two times a day (BID) | RESPIRATORY_TRACT | Status: DC
  Administered 2023-08-25 – 2023-09-02 (×15): 15 ug via RESPIRATORY_TRACT
  Filled 2023-08-25 (×14): qty 2

## 2023-08-25 MED ORDER — IPRATROPIUM-ALBUTEROL 0.5-2.5 (3) MG/3ML IN SOLN: 3.0000 mL | RESPIRATORY_TRACT | Status: DC | PRN

## 2023-08-25 MED ORDER — PANTOPRAZOLE SODIUM 40 MG PO TBEC
40.0000 mg | DELAYED_RELEASE_TABLET | Freq: Every day | ORAL | Status: DC
  Administered 2023-08-25 – 2023-09-02 (×9): 40 mg via ORAL
  Filled 2023-08-25 (×2): qty 1
  Filled 2023-08-25: qty 2
  Filled 2023-08-25 (×6): qty 1

## 2023-08-25 MED ORDER — METHYLPREDNISOLONE SODIUM SUCC 40 MG IJ SOLR
0.5000 mg/kg | Freq: Two times a day (BID) | INTRAMUSCULAR | Status: AC
  Administered 2023-08-25 – 2023-08-28 (×6): 37.2 mg via INTRAVENOUS
  Filled 2023-08-25 (×6): qty 1

## 2023-08-25 MED ORDER — ASPIRIN 81 MG PO TBEC: 81.0000 mg | DELAYED_RELEASE_TABLET | Freq: Every evening | ORAL | Status: DC

## 2023-08-25 NOTE — Progress Notes (Signed)
PHARMACY - ANTICOAGULATION CONSULT NOTE  Pharmacy Consult for Heparin Indication: NSTEMI  No Known Allergies  Patient Measurements: Height: 5\' 5"  (165.1 cm) Weight: 74.2 kg (163 lb 9.3 oz) IBW/kg (Calculated) : 61.5 Heparin Dosing Weight: total weight  Vital Signs: Temp: 98.1 F (36.7 C) (10/03 1402) Temp Source: Oral (10/03 1402) BP: 139/59 (10/03 1402) Pulse Rate: 108 (10/03 1402)  Labs: No results for input(s): "HGB", "HCT", "PLT", "APTT", "LABPROT", "INR", "HEPARINUNFRC", "HEPRLOWMOCWT", "CREATININE", "CKTOTAL", "CKMB", "TROPONINIHS" in the last 72 hours.  CrCl cannot be calculated (Patient's most recent lab result is older than the maximum 21 days allowed.).   Medical History: Past Medical History:  Diagnosis Date   Anemia    CAD (coronary artery disease)    12/29/2011 normal R/S nuclear study;08/05/2010 cath smooth 50% ostial left main 60% distal left main, 60% ca+ smooth prox LAD, 70% ramus intermed., 90% prox. near ostial, 80% prox. CX, diffuse 30%prox. 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 Early and Hendrickson    Medications:  Scheduled:   arformoterol  15 mcg Nebulization BID   [START ON 08/26/2023] aspirin EC  81 mg Oral QPM   atorvastatin  40 mg Oral QPM   budesonide (PULMICORT) nebulizer solution  0.25 mg Nebulization BID   furosemide  40 mg Intravenous Once   insulin aspart  0-15 Units Subcutaneous TID WC   insulin aspart  0-5 Units Subcutaneous QHS   ipratropium-albuterol  3 mL Nebulization QID   losartan  50 mg Oral Daily    methylPREDNISolone (SOLU-MEDROL) injection  0.5 mg/kg Intravenous Q12H   Followed by   Melene Muller ON 08/28/2023] predniSONE  50 mg Oral Daily   metoprolol tartrate  50 mg Oral BID   pantoprazole  40 mg Oral Daily   Infusions:  PRN: acetaminophen **OR** acetaminophen, hydrALAZINE  Assessment: 82 yo male transferred from [facility] for management of NSTEMI. Already on IV heparin drip at 800 units/hr. Heparin level is therapeutic at 0.55. No bleeding reported. Hgb 8.8, Plts WNL  Goal of Therapy:  Heparin level 0.3-0.7 units/ml Monitor platelets by anticoagulation protocol: Yes   Plan:  Continue heparin drip at 800 units/hr Check confirmatory heparin level in 8hrs Daily heparin level and CBC F/u cards recommendations  Loralee Pacas, PharmD, BCPS 08/25/2023,3:48 PM  Please check AMION for all [facility] Pharmacy phone numbers After 10:00 PM, call Main Pharmacy [phone]

## 2023-08-25 NOTE — Progress Notes (Signed)
PHARMACY - ANTICOAGULATION Pharmacy Consult for Heparin Indication: NSTEMI Brief A/P: Heparin level within goal range Continue Heparin at current rate   No Known Allergies  Patient Measurements: Height: 5\' 5"  (165.1 cm) Weight: 74.2 kg (163 lb 9.3 oz) IBW/kg (Calculated) : 61.5 Heparin Dosing Weight: total weight  Vital Signs: Temp: 98.1 F (36.7 C) (10/03 1402) Temp Source: Oral (10/03 1402) BP: 125/48 (10/03 2108) Pulse Rate: 124 (10/03 2108)  Labs: Recent Labs    08/25/23 1539 08/25/23 1547 08/25/23 1851 08/25/23 2225  HGB 8.8*  --   --   --   HCT 28.3*  --   --   --   PLT 263  --   --   --   APTT 142*  --   --   --   LABPROT 15.4*  --   --   --   INR 1.2  --   --   --   HEPARINUNFRC  --  0.55  --  0.61  CREATININE 1.65*  --   --   --   TROPONINIHS 2,532*  --  3,072*  --     Estimated Creatinine Clearance: 32 mL/min (A) (by C-G formula based on SCr of 1.65 mg/dL (H)).  Assessment: 82 yo male with NSTEMI for heparin.   Goal of Therapy:  Heparin level 0.3-0.7 units/ml Monitor platelets by anticoagulation protocol: Yes   Plan:  No change to heparin   Geannie Risen, PharmD, BCPS

## 2023-08-25 NOTE — Plan of Care (Signed)
Transfer from [facility] due to NSTEMI.  Cardiology asked [place] to admit as patient found to be positive for COVID-19 and currently requiring 6 L of oxygen.

## 2023-08-25 NOTE — Consult Note (Addendum)
Cardiology Consultation   Patient ID: Thomas Gray MRN: [id number]; DOB: 1941/07/17  Admit date: 08/25/2023 Date of Consult: 08/25/2023  PCP:  Olive Bass, MD   [facility] Providers Cardiologist:  Nanetta Batty, MD   {  Patient Profile:   Thomas Gray is a 82 y.o. male with a hx of CAD status post CABG 2011 with concomitant left carotid artery enterectomy, hypertension, hyperlipidemia, previous tobacco user, COPD, discoid lupus, who is being seen 08/25/2023 for the evaluation of elevated troponins.  Patient is a transfer from [facility] due to elevated troponins in the setting of COVID infection and respiratory failure.  History of Present Illness:   Thomas Gray is followed by Thomas Gray for his CAD that appears to be chronic and stable.  He had his CABG in 2011 with concomitant left carotid artery enterectomy.  Had a normal Myoview in 2013/2020.  In 2021 he had some shortness of breath and chest pain which showed anteroapical scar without ischemia which was new.  Underwent cardiac catheterization July 2021 that revealed patent grafts with an occluded nondominant circumflex and normal RV function.  He has done very well from a cardiac standpoint without any symptoms.  He works out 5 days a week.  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  No evidence of DVTs in the past.  He did have superficial venous reflux in the right great saphenous vein.  Patient has been transferred from [facility] due to COVID infection, respiratory failure, elevated troponins.  He reports getting a flu vaccine on Tuesday and then the following day had significant symptoms of shortness of breath and difficulty breathing due to tight airways.  He has not reported any chest pain, peripheral edema, shortness of breath outside of this event, fatigue, orthopnea.  In the emergency room he had significant oxygen demands and was  placed on 8 L however seems to be doing much better and now between 1 and 2 L via high flow nasal cannula.  He has been given bronchodilators and steroids with significant improvement.  Also, he was started on heparin drip.  Cardiology and critical care have been consulted.  Troponins elevated at 0.4.  Chest x-ray showing pulmonary edema.  He had multiple EKGs that showed sinus tachycardia with heart rates near the 120s.  First-degree AV block PR 244.  Diffuse ST depressions inferolaterally.  ST elevation in aVR.  No changes between them. WBC 13.9.  Hemoglobin 9.5.  Monocyte 15.9.  Potassium 4.2, creatinine 1.2, lactic acid 3.7.  proBNP 2590.   Past Medical History:  Diagnosis Date   Anemia    CAD (coronary artery disease)    12/29/2011 normal R/S nuclear study;08/05/2010 cath smooth 50% ostial left main 60% distal left main, 60% ca+ smooth prox LAD, 70% ramus intermed., 90% prox. near ostial, 80% prox. CX, diffuse 30%prox. 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 Early and Dorris Fetch    Past Surgical History:  Procedure Laterality Date   CAROTID ENDARTERECTOMY  08/07/2010   CHOLECYSTECTOMY  1991   gall bladder   CORONARY ARTERY BYPASS GRAFT  08/07/2010   ENDARTERECTOMY Right 08/03/2019   Procedure: ENDARTERECTOMY RIGHT CAROTID;  Surgeon: Larina Earthly, MD;  Location: [facility] OR;  Service: Vascular;  Laterality: Right;   LEFT HEART CATH AND CORS/GRAFTS ANGIOGRAPHY N/A 05/27/2020   Procedure: LEFT HEART CATH AND CORS/GRAFTS ANGIOGRAPHY;  Surgeon: Lyn Records, MD;  Location: [facility]  INVASIVE CV LAB;  Service: Cardiovascular;  Laterality: N/A;   PATCH ANGIOPLASTY Right 08/03/2019   Procedure: Patch Angioplasty using Hemashield Platinum Finesse.;  Surgeon: Larina Earthly, MD;  Location: [facility] OR;  Service: Vascular;  Laterality: Right;   TONSILLECTOMY  1976   TOOTH EXTRACTION  Jan. and Aug.  2015     Inpatient Medications: Scheduled Meds:  arformoterol  15 mcg Nebulization BID   [START ON 08/26/2023] aspirin EC  81 mg Oral QPM   atorvastatin  40 mg Oral QPM   budesonide (PULMICORT) nebulizer solution  0.25 mg Nebulization BID   insulin aspart  0-15 Units Subcutaneous TID WC   insulin aspart  0-5 Units Subcutaneous QHS   ipratropium-albuterol  3 mL Nebulization QID   losartan  50 mg Oral Daily   methylPREDNISolone (SOLU-MEDROL) injection  0.5 mg/kg Intravenous Q12H   Followed by   Melene Muller ON 08/28/2023] predniSONE  50 mg Oral Daily   metoprolol tartrate  50 mg Oral BID   pantoprazole  40 mg Oral Daily   Continuous Infusions:  heparin     PRN Meds: acetaminophen **OR** acetaminophen, hydrALAZINE  Allergies:   No Known Allergies  Social History:   Social History   Socioeconomic History   Marital status: Divorced    Spouse name: Not on file   Number of children: 3   Years of education: 12 + 3   Highest education level: Not on file  Occupational History   Occupation: RETIRED    Comment: MEDICAL TECHNICIAN  Tobacco Use   Smoking status: Former    Current packs/day: 0.00    Types: Cigarettes    Quit date: 08/07/2010    Years since quitting: 13.0   Smokeless tobacco: Never  Vaping Use   Vaping status: Never Used  Substance and Sexual Activity   Alcohol use: Yes    Comment: 1 glass of wine weekly   Drug use: No   Sexual activity: Not Currently  Other Topics Concern   Not on file  Social History Narrative   Not on file   Social Determinants of Health   Financial Resource Strain: Not on file  Food Insecurity: No Food Insecurity (08/25/2023)   Hunger  Vital Sign    Worried About Running Out of Food in the Last Year: Never true    Ran Out of Food in the Last Year: Never true  Transportation Needs: No Transportation Needs (08/25/2023)   PRAPARE - Administrator, Civil Service (Medical): No    Lack of Transportation (Non-Medical): No  Physical Activity: Not on file  Stress: Not on file  Social Connections: Not on file  Intimate Partner Violence: Not At Risk (08/25/2023)   Humiliation, Afraid, Rape, and Kick questionnaire    Fear of Current or Ex-Partner: No    Emotionally Abused: No    Physically Abused: No    Sexually Abused: No    Family History:   Family History  Problem Relation Age of Onset   Hypertension Mother  Diabetes Mother    Heart disease Mother        After age 49   Tuberculosis Father      ROS:  Please see the history of present illness.  All other ROS reviewed and negative.     Physical Exam/Data:   Vitals:   08/25/23 1402 08/25/23 1518 08/25/23 1659  BP: (!) 139/59    Pulse: (!) 108  100  Resp: 18  18  Temp: 98.1 F (36.7 C)    TempSrc: Oral    SpO2: 100% 98% 98%  Weight: 74.2 kg    Height: 5\' 5"  (1.651 m)     No intake or output data in the 24 hours ending 08/25/23 1715    08/25/2023    2:02 PM 07/13/2023    1:42 PM 11/04/2022   11:04 AM  Last 3 Weights  Weight (lbs) 163 lb 9.3 oz 168 lb 12.8 oz 172 lb 9.6 oz  Weight (kg) 74.2 kg 76.567 kg 78.291 kg     Body mass index is 27.22 kg/m.  General: Ill-appearing, on 1 to 2 L of high flow nasal cannula HEENT: normal Neck: + JVD Vascular: No carotid bruits; Distal pulses 2+ bilaterally Cardiac:  normal S1, S2; RRR; no murmur.  Tachycardic Lungs: Crackles, wheezing, cough Abd: soft, nontender, no hepatomegaly  Ext: 1-2+ edema.  Musculoskeletal:  No deformities, BUE and BLE strength normal and equal Skin: warm and dry  Neuro:  CNs 2-12 intact, no focal abnormalities noted Psych:  Normal affect   EKG:  The EKG was personally  reviewed and demonstrates: Multiple EKG showed sinus tachycardia with heart rates near the 120s.  First-degree AV block PR 244.  Diffuse ST depressions inferolaterally.  ST elevation in aVR Telemetry:  Telemetry was personally reviewed and demonstrates: Sinus tachycardia.  Heart rates in the 100's 110s.  ST depressions.  Relevant CV Studies: Left heart catheterization 05/27/2020 severe native vessel CAD with total occlusion of proximal to mid LAD, total occlusion of the native circumflex, high-grade diffuse obstruction and a large ramus intermedius, and diffuse high-grade 99% stenosis in proximal and distal RCA. Patent sequential saphenous vein graft to PDA and PL. Patent saphenous vein graft to trifurcating ramus intermedius. Patent LIMA to distal LAD. Normal left ventricular systolic function.  LVEF 55%.  LVEDP between 15 and 20 mmHg.   RECOMMENDATIONS:   Further evaluation is needed by the primary team. Preventive therapy. Aggressive risk factor modification including blood pressure control.  Laboratory Data:  High Sensitivity Troponin:   Recent Labs  Lab 08/25/23 1539  TROPONINIHS 2,532*     Chemistry Recent Labs  Lab 08/25/23 1539  NA 137  K 4.0  CL 104  CO2 18*  GLUCOSE 258*  BUN 20  CREATININE 1.65*  CALCIUM 8.2*  MG 1.7  GFRNONAA 41*  ANIONGAP 15    Recent Labs  Lab 08/25/23 1539  PROT 6.9  ALBUMIN 3.3*  AST 81*  ALT 30  ALKPHOS 48  BILITOT 0.6   Lipids No results for input(s): "CHOL", "TRIG", "HDL", "LABVLDL", "LDLCALC", "CHOLHDL" in the last 168 hours.  Hematology Recent Labs  Lab 08/25/23 1539  WBC 7.4  RBC 3.40*  HGB 8.8*  HCT 28.3*  MCV 83.2  MCH 25.9*  MCHC 31.1  RDW 25.0*  PLT 263   Thyroid No results for input(s): "TSH", "FREET4" in the last 168 hours.  BNPNo results for input(s): "BNP", "PROBNP" in the last 168 hours.  DDimer No results for input(s): "DDIMER" in the  last 168 hours.   Radiology/Studies:  DG Chest Port 1  View  Result Date: 08/25/2023 CLINICAL DATA:  82 year old male with respiratory failure, hypoxia. EXAM: PORTABLE CHEST 1 VIEW COMPARISON:  Portable chest 08/25/2023 and earlier. FINDINGS: Portable AP semi upright view at 1438 hours. Mildly rotated to the right now. Chronic CABG. Calcified aortic atherosclerosis. Other mediastinal contours are within normal limits. Mildly improved lung volumes. Regressed pulmonary interstitial opacity bilaterally. Currently no pneumothorax, pleural effusion, acute or confluent pulmonary opacity identified. No acute osseous abnormality identified. Negative visible bowel gas. IMPRESSION: 1. Resolved pulmonary interstitial opacity since yesterday, favor resolved interstitial edema. 2. Chronic CABG.  No new cardiopulmonary abnormality. Electronically Signed   By: Odessa Fleming M.D.   On: 08/25/2023 16:44     Assessment and Plan:   NSTEMI CAD status post CABG in 2011 2021 cardiac catheterization revealed patent grafts with an occluded nondominant circumflex no suitable intervention.  He does extremely well from a cardiac standpoint with no previous history of chest pain in several years. EKG looks ischemic and shows diffuse ST depressions inferolaterally and ST elevation in aVR that may be related to losing a graft or related to his elevated lactic acid 3.7 and infection. Trops here 2,500+.  Discussed with MD will plan for cath tomorrow depending on renal function.  Get echocardiogram, continue IV heparin Continue with aspirin, atorvastatin 40 mg, changing Lopressor 50 mg twice daily, to 25mg  Q8.  Acute HFpEF Chronic venous insufficiency/lymphedema Not sure if this is entirely accurate diagnosis as he has no shortness of breath or clinical signs of heart failure PTA. He does have bilateral swelling in his lower extremities that has been evaluated by vascular and felt to be more related to chronic venous insufficiency and lymphedema.  However, he does present with crackles, JVD,  worsening swelling today. He is Lasix nave.  Agree with IV Lasix 40 mg for now.  Can reevaluate tomorrow if he needs ongoing diuresis. Hold losartan 50 mg, and Jardiance.  CKD Creatinine 1.65, elevated from prior readings with baseline being around 1.1-1.3. not sure if early signs of shock but does not clinically appear that way. May not be cath candidate if renal function continues to decline   Hypertension Hold losartan 50 mg.    Diabetes A1c 7.3%.  COVID-19 infection with pneumonitis COPD exacerbation Acute hypoxic respiratory failure Per primary team.  Informed Consent   Shared Decision Making/Informed Consent{ The risks [stroke (1 in 1000), death (1 in 1000), kidney failure [usually temporary] (1 in 500), bleeding (1 in 200), allergic reaction [possibly serious] (1 in 200)], benefits (diagnostic support and management of coronary artery disease) and alternatives of a cardiac catheterization were discussed in detail with Thomas Gray and he is willing to proceed.     Risk Assessment/Risk Scores:   TIMI Risk Score for Unstable Angina or Non-ST Elevation MI:   The patient's TIMI risk score is 5, which indicates a 26% risk of all cause mortality, new or recurrent myocardial infarction or need for urgent revascularization in the next 14 days.{  New York Heart Association (NYHA) Functional Class NYHA Class I      For questions or updates, please contact [facility] Please consult [email or web address] for contact info under    Signed, Abagail Kitchens, PA-C  08/25/2023 5:15 PM   History and all data above reviewed.  Patient examined.  I agree with the findings as above.   The patient had a history of coronary artery disease as above.  He said bypass grafting with patent bypass grafts as described.  He been doing well.  He exercises routinely.  He said he suddenly got short of breath yesterday evening.  He is not having any chest pressure, neck or arm discomfort.  He was not having  any palpitations, presyncope or syncope.  He not been having any PND or orthopnea.  He not really been having any cough fevers or chills.  He initially required about 8 L of oxygen but then improved after bronchodilators.  He was noted to have diffuse ST depression on his EKG in the lateral inferior leads.  Troponin was elevated.  He was sent here for further evaluation and management.  He actually says he feels much better since he got bronchodilators.  He again is denying any chest discomfort or neck discomfort.  First troponin here is greater than 2500.  EKG is unchanged with diffuse ST depression.  Bedside echo demonstrates of slightly reduced ejection fraction although the images were somewhat suboptimal.  He is tachycardic.  He is oxygenating well on 2 L.  The patient exam reveals COR: Regular rate and rhythm, no rubs, no murmurs,  Lungs: Clear to auscultation bilaterally,  Abd: Positive bowel sounds no rebound, no guarding, Ext 2+ pulses, mild right greater than left lower extremity edema.  All available labs, radiology testing, previous records reviewed. Agree with documented assessment and plan.  Non-Q wave microinfarction: The patient does have significant ST depression on his inferolateral leads and has had a non-Q wave myocardial infarction.  I think this probably happened acutely at the time of presentation and was evident on his initial EKGs.  I did review this with interventional on call .  Given the fact that there is no diffuse ST elevation and that he is without symptoms and given the time from the onset of his acute shortness of breath I do not think there is an indication for urgent cardiac catheterization.  He also has renal insufficiency that has developed.    Tonight he will receive IV heparin.  I do feel comfortable starting him on immediate metoprolol tartrate.  With renal insufficiency we will hold his ARB.  We will tentatively plan cardiac catheterization tomorrow.    I did review his  previous cath films.  AKI: We will hold diuresis and ARB follow his creatinine in the morning.  Anemia: This seems to be chronic.  He had no active bleeding.  Rollene Rotunda  6:21 PM  08/25/2023

## 2023-08-25 NOTE — H&P (Signed)
TRH H&P   Patient Demographics:    Thomas Gray, is a 82 y.o. male  MRN: [id number]   DOB - [birth year]  Admit Date - 08/25/2023  Outpatient Primary MD for the patient is Dough, Doris Cheadle, MD  Referring MD/NP/PA: From [facility]  Outpatient Specialists: Cardiology Dr. Gery Pray  Patient coming from: [facility]  No chief complaint on file.     HPI:    Thomas Gray  is a 82 y.o. male, with past medical history of heavy smoking in the past, quit 13 years ago, COPD, not home oxygen, history of CAD status post CABG, carotid artery disease, discoid lupus erythematosus, hypertension, hyperlipidemia and GERD. -Patient was sent to [facility] secondary to complaints of cough, shortness of breath, patient was transferred to [facility] due to concern of NSTEMI, patient reports his son was diagnosed with COVID last week, he was exposed to him, reports dyspnea over the last few days, cough, he denies fever, chills, chest pain, no nausea or vomiting, but reports generalized weakness, frail, enterography ED initially patient requiring 8 L oxygen, but currently upon my evaluation he is on 2 L oxygen, by reviewing records from [place] it does appear he had elevated troponins at 0.66, (normal less than 0.04, labs were significant for sodium of 138, potassium of 4.2, carbon dioxide of 23, anion gap of 14, creatinine of 1.2, GFR of 58, glucose of 207, lactic acid elevated at 3.7, BNP elevated at 2590 (normal up to 1800), chest x-ray significant for vascular congestion, patient was started on heparin gtt. and transfer was requested to [facility] for possible NSTEMI, cardiology accepted the patient and recommended admission to the hospitalist service due to multiple comorbidities.    Review of systems:      A full 10 point Review of Systems was done, except as stated above, all other  Review of Systems were negative.   With Past History of the following :    Past Medical History:  Diagnosis Date   Anemia    CAD (coronary artery disease)    12/29/2011 normal R/S nuclear study;08/05/2010 cath smooth 50% ostial left main 60% distal left main, 60% ca+ smooth prox LAD, 70% ramus intermed., 90% prox. near ostial, 80% prox. CX, diffuse 30%prox. 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 Early and Dorris Fetch      Past Surgical History:  Procedure Laterality Date   CAROTID ENDARTERECTOMY  08/07/2010   CHOLECYSTECTOMY  1991   gall bladder   CORONARY ARTERY BYPASS GRAFT  08/07/2010   ENDARTERECTOMY Right 08/03/2019   Procedure: ENDARTERECTOMY RIGHT CAROTID;  Surgeon: Larina Earthly, MD;  Location: [facility] OR;  Service: Vascular;  Laterality: Right;   LEFT HEART CATH AND CORS/GRAFTS ANGIOGRAPHY N/A 05/27/2020   Procedure: LEFT HEART CATH AND CORS/GRAFTS ANGIOGRAPHY;  Surgeon: Lyn Records, MD;  Location: [facility] INVASIVE CV LAB;  Service: Cardiovascular;  Laterality: N/A;   PATCH ANGIOPLASTY Right 08/03/2019   Procedure: Patch Angioplasty using Hemashield Platinum Finesse.;  Surgeon: Larina Earthly, MD;  Location: [facility] OR;  Service: Vascular;  Laterality: Right;   TONSILLECTOMY  1976   TOOTH EXTRACTION  Jan. and Aug.  2015      Social History:     Social History   Tobacco Use   Smoking status: Former    Current packs/day: 0.00    Types: Cigarettes    Quit date: 08/07/2010    Years since quitting: 13.0   Smokeless tobacco: Never  Substance Use Topics   Alcohol use:  Yes    Comment: 1 glass of wine weekly       Family History :     Family History  Problem Relation Age of Onset   Hypertension Mother    Diabetes Mother    Heart disease Mother        After age 23   Tuberculosis Father      Home Medications:   Prior to Admission medications   Medication Sig Start Date End Date Taking? Authorizing Provider  amLODipine (NORVASC) 5 MG tablet TAKE 1 TABLET BY MOUTH EVERY DAY 08/04/23   Runell Gess, MD  aspirin EC 81 MG tablet Take 81 mg by mouth every evening.    [provider]  atorvastatin (LIPITOR) 40 MG tablet Take 1 tablet (40 mg total) by mouth every evening. 05/28/20   Meredeth Ide, MD  cetirizine (ZYRTEC) 10 MG tablet Take 10 mg by mouth daily as needed for allergies or rhinitis.    [provider]  Difluprednate 0.05 % EMUL Apply to eye.    [provider]  doxycycline (ADOXA) 50 MG tablet Take 50 mg by mouth 2 (two) times daily.    [provider]  FARXIGA 10 MG TABS tablet Take 10 mg by mouth every morning. 10/08/21   [provider]  losartan (COZAAR) 50 MG tablet Take 1 tablet (50 mg total) by mouth daily. Please be sure to make your appointment in December for further refills. 08/11/23   Runell Gess, MD  metFORMIN (GLUCOPHAGE) 500 MG tablet Take 500 mg by mouth 2 (two) times daily with a meal.  01/09/18   [provider]  metoprolol tartrate (LOPRESSOR) 50 MG tablet TAKE 1 TABLET BY MOUTH TWICE A DAY 07/22/23   Runell Gess, MD  ofloxacin (OCUFLOX) 0.3 % ophthalmic solution PLEASE SEE ATTACHED FOR DETAILED DIRECTIONS    [provider]  omeprazole (PRILOSEC) 40 MG capsule TAKE 1 CAPSULE (40 MG TOTAL) BY MOUTH DAILY. KEEP OV. 06/21/23   Runell Gess, MD  Probiotic Product [facility] COLON HEALTH) CAPS Take 1 capsule by mouth daily.    [provider]     Allergies:    No Known Allergies   Physical Exam:   Vitals  Blood pressure (!) 139/59,  pulse (!) 108, temperature 98.1 F (36.7 C), temperature source Oral, resp. rate 18, height 5\' 5"  (1.651 m), weight 74.2  kg, SpO2 100%.   1. General Frail elderly male, laying in bed, no apparent distress  2. Normal affect and insight, Not Suicidal or Homicidal, Awake Alert, Oriented X 3.  3. No F.N deficits, ALL C.Nerves Intact, Strength 5/5 all 4 extremities, Sensation intact all 4 extremities, Plantars down going.  4. Ears and Eyes appear Normal, Conjunctivae clear, PERRLA. Moist Oral Mucosa.  5. Supple Neck, No JVD, No cervical lymphadenopathy appriciated, No Carotid Bruits.  6. Symmetrical Chest wall movement, Good air movement bilaterally, scattered wheezing  7. RRR, No Gallops, Rubs or Murmurs, No Parasternal Heave.  8. Positive Bowel Sounds, Abdomen Soft, No tenderness, No organomegaly appriciated,No rebound -guarding or rigidity.  9.  No Cyanosis, Normal Skin Turgor, No Skin Rash or Bruise.  10. Good muscle tone,  joints appear normal , no effusions, Normal ROM.     Data Review:    CBC No results for input(s): "WBC", "HGB", "HCT", "PLT", "MCV", "MCH", "MCHC", "RDW", "LYMPHSABS", "MONOABS", "EOSABS", "BASOSABS", "BANDABS" in the last 168 hours.  Invalid input(s): "NEUTRABS", "BANDSABD" ------------------------------------------------------------------------------------------------------------------  Chemistries  No results for input(s): "NA", "K", "CL", "CO2", "GLUCOSE", "BUN", "CREATININE", "CALCIUM", "MG", "AST", "ALT", "ALKPHOS", "BILITOT" in the last 168 hours.  Invalid input(s): "GFRCGP" ------------------------------------------------------------------------------------------------------------------ CrCl cannot be calculated (Patient's most recent lab result is older than the maximum 21 days allowed.). ------------------------------------------------------------------------------------------------------------------ No results for input(s): "TSH", "T4TOTAL",  "T3FREE", "THYROIDAB" in the last 72 hours.  Invalid input(s): "FREET3"  Coagulation profile No results for input(s): "INR", "PROTIME" in the last 168 hours. ------------------------------------------------------------------------------------------------------------------- No results for input(s): "DDIMER" in the last 72 hours. -------------------------------------------------------------------------------------------------------------------  Cardiac Enzymes No results for input(s): "CKMB", "TROPONINI", "MYOGLOBIN" in the last 168 hours.  Invalid input(s): "CK" ------------------------------------------------------------------------------------------------------------------ No results found for: "BNP"   ---------------------------------------------------------------------------------------------------------------  Urinalysis    Component Value Date/Time   COLORURINE YELLOW 08/03/2019 0626   APPEARANCEUR CLEAR 08/03/2019 0626   LABSPEC 1.018 08/03/2019 0626   PHURINE 5.0 08/03/2019 0626   GLUCOSEU NEGATIVE 08/03/2019 0626   HGBUR SMALL (A) 08/03/2019 0626   BILIRUBINUR NEGATIVE 08/03/2019 0626   KETONESUR NEGATIVE 08/03/2019 0626   PROTEINUR 100 (A) 08/03/2019 0626   UROBILINOGEN 1.0 08/07/2010 0024   NITRITE NEGATIVE 08/03/2019 0626   LEUKOCYTESUR NEGATIVE 08/03/2019 0626    ----------------------------------------------------------------------------------------------------------------   Imaging Results:    No results found.  EKG is pending, chest x-ray is pending, labs including CBC, BMP, BNP, troponins, are pending   Assessment & Plan:    Principal Problem:   COVID-19 virus infection Active Problems:   Carotid artery disease (HCC)   CAD, multiple vessel   S/P CABG x 4   Essential hypertension, benign   COPD exacerbation (HCC)   Chronic kidney disease, stage 3 (HCC)   Peripheral vascular disease (HCC)   Type 2 diabetes mellitus without complication, without  long-term current use of insulin (HCC)    Acute hypoxic respiratory failure COVID-19 infection with COVID pneumonitis COPD exacerbation -Pulmonary consult greatly appreciated -Presents with new hypoxia, new oxygen requirement, he is 86% on room air, currently on 2 L oxygen -Patient with new hypoxia, he will be started on steroids, pulmonary input greatly appreciated.  -Presents of pneumonia on imaging, so we will hold on Paxlovid and remdesivir -was encouraged use incentive spirometry, flutter valve -History of underlying COPD, continue with steroid, scheduled DuoNebs.  Elevated troponins, concern for NSTEMI -Troponins is elevated at OSH, at 0.66 (baseline 0.04) -Started on heparin GTT, will continue for now pending further cardiology recommendation -Continue with aspirin, statin, beta-blockers -  Continue to trend troponins  History of CAD status post CABG x 4 -Continue with aspirin, Lipitor, metoprolol  CKD stage III AA -Renal function appears to be at baseline, continue to monitor, especially as he will need some IV Lasix  Acute on chronic diastolic CHF  -most recent echo on record in 2021 with preserved EF, he appears to be with evidence of volume overload with +1 lower extremity edema, vascular congestion on imaging and elevated BNP, will 1 dose of Lasix and defer further diuresis up to cardiology.  Hypertension -continue with amlodipine, losartan and metoprolol  GERD-continue with PPI  Diabetes mellitus, type II -Will check A1c, hold metformin, will defer Marcelline Deist resumption up to cardiology, but will keep an insulin sliding scale  GERD -Continue with PPIs  Peripheral vascular disease Carotid artery disease -Continue with aspirin and Lipitor   DVT Prophylaxis Heparin DRIP AM Labs Ordered, also please review Full Orders  Family Communication: Admission, patients condition and plan of care including tests being ordered have been discussed with the patient  who  indicate understanding and agree with the plan and Code Status.  Code Status full as discussed with the patient, but he does not wish to be kept on life support if he is admitted to the state.  Likely DC to home  Condition GUARDED  Consults called: Pulmonary, cardiology  Admission status: Inpatient  Time spent in minutes : 70 minutes   Huey Bienenstock M.D on 08/25/2023 at 3:18 PM   [facility] - Office  [phone]

## 2023-08-25 NOTE — Plan of Care (Signed)

## 2023-08-25 NOTE — Consult Note (Signed)
NAME:  Thomas Gray, MRN:  [id number], DOB:  1941-05-23, LOS: 0 ADMISSION DATE:  08/25/2023, CONSULTATION DATE:  08/25/23 REFERRING MD:  Katrinka Blazing, CHIEF COMPLAINT:  SOB   History of Present Illness:  This is a 82 year old man with a history of heavy smoking, COPD not on home oxygen, myriad of other chronic medical illnesses listed below who is presenting with 1 day of worsening shortness of breath.  At outside hospital he was diagnosed with possible NSTEMI as well as being COVID-positive.  He does say that his son was diagnosed with COVID last week.  Baseline mMRC is 0.  He is on no home inhalers.  He has received steroids and 1 round of albuterol and feels markedly better.  In report he was requiring 8 L of oxygen, he is currently requiring 2 L of oxygen.  Pulmonology is consulted.  Review of systems as below.  Pertinent  Medical History   Past Medical History:  Diagnosis Date   Anemia    CAD (coronary artery disease)    12/29/2011 normal R/S nuclear study;08/05/2010 cath smooth 50% ostial left main 60% distal left main, 60% ca+ smooth prox LAD, 70% ramus intermed., 90% prox. near ostial, 80% prox. CX, diffuse 30%prox. 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 Early and [facility] Events: Including procedures, antibiotic start and stop dates in addition to other pertinent events     Interim History / Subjective:  Consult  Objective   Blood pressure (!) 139/59, pulse (!) 108, temperature 98.1 F (36.7 C), temperature source Oral, resp. rate 18,  height 5\' 5"  (1.651 m), weight 74.2 kg, SpO2 100%.       No intake or output data in the 24 hours ending 08/25/23 1433 Filed Weights   08/25/23 1402  Weight: 74.2 kg    Examination: General: Generally well-appearing elderly man in no acute distress HENT: Mucous membranes moist, Mallampati 2 Lungs: He has crackles bilaterally, nonlabored breathing pattern Cardiovascular: Heart sounds are regular, extremities warm Abdomen: Soft, positive bowel sounds Extremities: Chronic lymphedema Neuro: Moves to command Skin: Age-related changes  I do not have the [employer] paperwork to review.  Resolved Hospital Problem list   Not applicable  Assessment & Plan:  Acute hypoxemic respiratory failure secondary to some combination of COVID pneumonitis, pulmonary edema, COPD exacerbation.  He has improved markedly with bronchodilators and steroids x 1 so may just be more consistent with bronchospasm.  Would check an x-ray, BNP, echocardiogram.  If renal function allows, diuretics may be helpful.  A 5-day course of steroids is reasonable.  Bronchodilators could include Brovana and Yupelri versus MDI.  As patient is requiring minimal oxygen, pulmonary and critical care will be available as needed.  Cardiology apparently to see patient regarding his mild troponin leak which is likely secondary to his respiratory issues.  Best Practice (right click and "Reselect all SmartList Selections" daily)  Per primary  Labs   CBC: No results for input(s): "WBC", "NEUTROABS", "HGB", "HCT", "MCV", "PLT" in  the last 168 hours.  Basic Metabolic Panel: No results for input(s): "NA", "K", "CL", "CO2", "GLUCOSE", "BUN", "CREATININE", "CALCIUM", "MG", "PHOS" in the last 168 hours. GFR: CrCl cannot be calculated (Patient's most recent lab result is older than the maximum 21 days allowed.). No results for input(s): "PROCALCITON", "WBC", "LATICACIDVEN" in the last 168 hours.  Liver Function Tests: No results for  input(s): "AST", "ALT", "ALKPHOS", "BILITOT", "PROT", "ALBUMIN" in the last 168 hours. No results for input(s): "LIPASE", "AMYLASE" in the last 168 hours. No results for input(s): "AMMONIA" in the last 168 hours.  ABG    Component Value Date/Time   PHART 7.312 (L) 08/07/2010 1845   PCO2ART 44.1 08/07/2010 1845   PO2ART 104.0 (H) 08/07/2010 1845   HCO3 22.1 08/07/2010 1845   TCO2 24 08/07/2010 2041   ACIDBASEDEF 4.0 (H) 08/07/2010 1845   O2SAT 97.0 08/07/2010 1845     Coagulation Profile: No results for input(s): "INR", "PROTIME" in the last 168 hours.  Cardiac Enzymes: No results for input(s): "CKTOTAL", "CKMB", "CKMBINDEX", "TROPONINI" in the last 168 hours.  HbA1C: Hgb A1c MFr Bld  Date/Time Value Ref Range Status  05/26/2020 03:47 PM 8.3 (H) 4.8 - 5.6 % Final    Comment:    (NOTE) Pre diabetes:          5.7%-6.4%  Diabetes:              >6.4%  Glycemic control for   <7.0% adults with diabetes   07/31/2019 11:38 AM 8.4 (H) 4.8 - 5.6 % Final    Comment:    (NOTE)         Prediabetes: 5.7 - 6.4         Diabetes: >6.4         Glycemic control for adults with diabetes: <7.0     CBG: No results for input(s): "GLUCAP" in the last 168 hours.  Review of Systems:    Positive Symptoms in bold:  Constitutional fevers, chills, weight loss, fatigue, anorexia, malaise  Eyes decreased vision, double vision, eye irritation  Ears, Nose, Mouth, Throat sore throat, trouble swallowing, sinus congestion  Cardiovascular chest pain, paroxysmal nocturnal dyspnea, lower ext edema, palpitations   Respiratory SOB, cough, DOE, hemoptysis, wheezing  Gastrointestinal nausea, vomiting, diarrhea  Genitourinary burning with urination, trouble urinating  Musculoskeletal joint aches, joint swelling, back pain  Integumentary  rashes, skin lesions  Neurological focal weakness, focal numbness, trouble speaking, headaches  Psychiatric depression, anxiety, confusion  Endocrine polyuria,  polydipsia, cold intolerance, heat intolerance  Hematologic abnormal bruising, abnormal bleeding, unexplained nose bleeds  Allergic/Immunologic recurrent infections, hives, swollen lymph nodes     Past Medical History:  He,  has a past medical history of Anemia, CAD (coronary artery disease), Carotid artery occlusion (08/04/2010), COPD (chronic obstructive pulmonary disease) (HCC), Diabetes mellitus without complication (HCC), H/O discoid lupus erythematosus, H/O discoid lupus erythematosus, Heart murmur, Hyperlipidemia, Hypertension, and S/P CABG x 4 (08/07/2010).   Surgical History:   Past Surgical History:  Procedure Laterality Date   CAROTID ENDARTERECTOMY  08/07/2010   CHOLECYSTECTOMY  1991   gall bladder   CORONARY ARTERY BYPASS GRAFT  08/07/2010   ENDARTERECTOMY Right 08/03/2019   Procedure: ENDARTERECTOMY RIGHT CAROTID;  Surgeon: Larina Earthly, MD;  Location: [facility] OR;  Service: Vascular;  Laterality: Right;   LEFT HEART CATH AND CORS/GRAFTS ANGIOGRAPHY N/A 05/27/2020   Procedure: LEFT HEART CATH AND CORS/GRAFTS ANGIOGRAPHY;  Surgeon: Lyn Records, MD;  Location: [facility] INVASIVE CV LAB;  Service: Cardiovascular;  Laterality: N/A;   PATCH ANGIOPLASTY Right 08/03/2019   Procedure: Patch Angioplasty using Hemashield Platinum Finesse.;  Surgeon: Larina Earthly, MD;  Location: [facility] OR;  Service: Vascular;  Laterality: Right;   TONSILLECTOMY  1976   TOOTH EXTRACTION  Jan. and Aug.  2015     Social History:   reports that he quit smoking about 13 years ago. His smoking use included cigarettes. He has never used smokeless tobacco. He reports current alcohol use. He reports that he does not use drugs.   Family History:  His family history includes Diabetes in his mother; Heart disease in his mother; Hypertension in his mother; Tuberculosis in his father.   Allergies No Known Allergies   Home Medications  Prior to Admission medications   Medication Sig Start Date End Date Taking? Authorizing  Provider  amLODipine (NORVASC) 5 MG tablet TAKE 1 TABLET BY MOUTH EVERY DAY 08/04/23   Runell Gess, MD  aspirin EC 81 MG tablet Take 81 mg by mouth every evening.    [provider]  atorvastatin (LIPITOR) 40 MG tablet Take 1 tablet (40 mg total) by mouth every evening. 05/28/20   Meredeth Ide, MD  cetirizine (ZYRTEC) 10 MG tablet Take 10 mg by mouth daily as needed for allergies or rhinitis.    [provider]  Difluprednate 0.05 % EMUL Apply to eye.    [provider]  doxycycline (ADOXA) 50 MG tablet Take 50 mg by mouth 2 (two) times daily.    [provider]  FARXIGA 10 MG TABS tablet Take 10 mg by mouth every morning. 10/08/21   [provider]  losartan (COZAAR) 50 MG tablet Take 1 tablet (50 mg total) by mouth daily. Please be sure to make your appointment in December for further refills. 08/11/23   Runell Gess, MD  metFORMIN (GLUCOPHAGE) 500 MG tablet Take 500 mg by mouth 2 (two) times daily with a meal.  01/09/18   [provider]  metoprolol tartrate (LOPRESSOR) 50 MG tablet TAKE 1 TABLET BY MOUTH TWICE A DAY 07/22/23   Runell Gess, MD  ofloxacin (OCUFLOX) 0.3 % ophthalmic solution PLEASE SEE ATTACHED FOR DETAILED DIRECTIONS    [provider]  omeprazole (PRILOSEC) 40 MG capsule TAKE 1 CAPSULE (40 MG TOTAL) BY MOUTH DAILY. KEEP OV. 06/21/23   Runell Gess, MD  Probiotic Product [facility] COLON HEALTH) CAPS Take 1 capsule by mouth daily.    [provider]     Critical care time: N/A

## 2023-08-26 ENCOUNTER — Inpatient Hospital Stay (HOSPITAL_COMMUNITY): Payer: Medicare Other

## 2023-08-26 DIAGNOSIS — U071 COVID-19: Secondary | ICD-10-CM | POA: Diagnosis not present

## 2023-08-26 DIAGNOSIS — I214 Non-ST elevation (NSTEMI) myocardial infarction: Secondary | ICD-10-CM | POA: Diagnosis not present

## 2023-08-26 DIAGNOSIS — J441 Chronic obstructive pulmonary disease with (acute) exacerbation: Secondary | ICD-10-CM | POA: Diagnosis not present

## 2023-08-26 LAB — CBC
HCT: 25.8 % — ABNORMAL LOW (ref 39.0–52.0)
Hemoglobin: 8 g/dL — ABNORMAL LOW (ref 13.0–17.0)
MCH: 25.6 pg — ABNORMAL LOW (ref 26.0–34.0)
MCHC: 31 g/dL (ref 30.0–36.0)
MCV: 82.7 fL (ref 80.0–100.0)
Platelets: 243 10*3/uL (ref 150–400)
RBC: 3.12 MIL/uL — ABNORMAL LOW (ref 4.22–5.81)
RDW: 24.8 % — ABNORMAL HIGH (ref 11.5–15.5)
WBC: 13.1 10*3/uL — ABNORMAL HIGH (ref 4.0–10.5)
nRBC: 0.4 % — ABNORMAL HIGH (ref 0.0–0.2)

## 2023-08-26 LAB — TROPONIN I (HIGH SENSITIVITY)
Troponin I (High Sensitivity): 11782 ng/L (ref <18)
Troponin I (High Sensitivity): 10836 ng/L (ref <18)

## 2023-08-26 LAB — ECHOCARDIOGRAM COMPLETE
Area-P 1/2: 4.31 cm2
Height: 65 in
S' Lateral: 2.3 cm
Weight: 2596.14 [oz_av]

## 2023-08-26 LAB — BASIC METABOLIC PANEL
Anion gap: 10 (ref 5–15)
BUN: 28 mg/dL — ABNORMAL HIGH (ref 8–23)
CO2: 24 mmol/L (ref 22–32)
Calcium: 8.1 mg/dL — ABNORMAL LOW (ref 8.9–10.3)
Chloride: 101 mmol/L (ref 98–111)
Creatinine, Ser: 1.62 mg/dL — ABNORMAL HIGH (ref 0.61–1.24)
GFR, Estimated: 42 mL/min — ABNORMAL LOW (ref >60)
Glucose, Bld: 189 mg/dL — ABNORMAL HIGH (ref 70–99)
Potassium: 4 mmol/L (ref 3.5–5.1)
Sodium: 135 mmol/L (ref 135–145)

## 2023-08-26 LAB — GLUCOSE, CAPILLARY
Glucose-Capillary: 160 mg/dL — ABNORMAL HIGH (ref 70–99)
Glucose-Capillary: 170 mg/dL — ABNORMAL HIGH (ref 70–99)
Glucose-Capillary: 222 mg/dL — ABNORMAL HIGH (ref 70–99)
Glucose-Capillary: 183 mg/dL — ABNORMAL HIGH (ref 70–99)

## 2023-08-26 LAB — HEPARIN LEVEL (UNFRACTIONATED): Heparin Unfractionated: 0.53 [IU]/mL (ref 0.30–0.70)

## 2023-08-26 MED ORDER — MAGNESIUM SULFATE 2 GM/50ML IV SOLN
2.0000 g | Freq: Once | INTRAVENOUS | Status: AC
  Administered 2023-08-26: 2 g via INTRAVENOUS
  Filled 2023-08-26: qty 50

## 2023-08-26 MED ORDER — SODIUM CHLORIDE 0.9 % WEIGHT BASED INFUSION
1.0000 mL/kg/h | INTRAVENOUS | Status: DC
  Administered 2023-08-26: 1 mL/kg/h via INTRAVENOUS

## 2023-08-26 MED ORDER — ALBUTEROL SULFATE (2.5 MG/3ML) 0.083% IN NEBU: 2.5000 mg | INHALATION_SOLUTION | RESPIRATORY_TRACT | Status: DC | PRN

## 2023-08-26 MED ORDER — IPRATROPIUM-ALBUTEROL 0.5-2.5 (3) MG/3ML IN SOLN
3.0000 mL | Freq: Two times a day (BID) | RESPIRATORY_TRACT | Status: DC
  Administered 2023-08-27 – 2023-09-02 (×13): 3 mL via RESPIRATORY_TRACT
  Filled 2023-08-26 (×14): qty 3

## 2023-08-26 MED ORDER — ASPIRIN 81 MG PO TBEC
81.0000 mg | DELAYED_RELEASE_TABLET | Freq: Every evening | ORAL | Status: DC
  Administered 2023-08-27 – 2023-09-01 (×5): 81 mg via ORAL
  Filled 2023-08-26 (×6): qty 1

## 2023-08-26 MED ORDER — ASPIRIN 81 MG PO CHEW
81.0000 mg | CHEWABLE_TABLET | ORAL | Status: AC
  Administered 2023-08-26: 81 mg via ORAL
  Filled 2023-08-26: qty 1

## 2023-08-26 MED ORDER — SODIUM CHLORIDE 0.9 % WEIGHT BASED INFUSION: 1.0000 mL/kg/h | INTRAVENOUS | Status: DC

## 2023-08-26 MED ORDER — SODIUM CHLORIDE 0.9 % WEIGHT BASED INFUSION: 3.0000 mL/kg/h | INTRAVENOUS | Status: DC

## 2023-08-26 MED ORDER — ASPIRIN 81 MG PO CHEW: 81.0000 mg | CHEWABLE_TABLET | ORAL | Status: DC

## 2023-08-26 NOTE — Care Management (Signed)
Transition of Care [facility]) - Inpatient Brief Assessment   Patient Details  Name: Thomas Gray MRN: [id number] Date of Birth: 06-May-1941  Transition of Care [facility] CM/SW Contact:    Lockie Pares, RN Phone Number: 08/26/2023, 3:05 PM   Clinical Narrative:  Patient presented with [facility]. Not on any oxygen at home nor takes any inhalers. Positive COVID and increased troponins. Going to cath lab today. OT assessed him to have no needs at this time.  TOC will watch for needs, oxygen and other, disposition will be based on hospitalization course and interventions.  TOC will follow Transition of Care Asessment: Insurance and Status: Insurance coverage has been reviewed Patient has primary care physician: Yes Home environment has been reviewed: home single family Prior level of function:: Independent Prior/Current Home Services: No current home services Social Determinants of Health Reivew: SDOH reviewed no interventions necessary Readmission risk has been reviewed: Yes Transition of care needs: no transition of care needs at this time

## 2023-08-26 NOTE — Progress Notes (Signed)
PROGRESS NOTE    Thomas Gray  HYQ:[id number] DOB: 04-23-1941 DOA: 08/25/2023 PCP: Olive Bass, MD   Brief Narrative:  82 y.o. male, with past medical history of heavy smoking in the past, quit 13 years ago, COPD, not home oxygen, history of CAD status post CABG, carotid artery disease, discoid lupus erythematosus, hypertension, hyperlipidemia and GERD was transferred from [facility] due to cough, shortness of breath with concern for non-STEMI.  Patient also tested positive for COVID.  On initial presentation, he required 8 L oxygen but subsequently required 2 L of oxygen.  In [facility], troponin was 0.66, lactic acid of 3.7, BNP of 2590 and chest x-ray suggestive of vascular congestion.  He was started on heparin drip.  Cardiology was consulted.  Pulmonary was consulted as well.  Assessment & Plan:   Acute respiratory failure with hypoxia COVID-19 infection COPD exacerbation -Initially required 8 L oxygen by nasal cannula.  Currently on 2 L oxygen.  Tested positive for COVID.  Chest x-ray on presentation showed resolved pulmonary edema.  Pulmonary evaluated the patient on 08/25/2023 and signed off. -Continue Solu-Medrol.  Continue nebs. -Wean off as able. -Continue isolation.  No need for Paxlovid and remdesivir at this time.   Non-STEMI in a patient with history of CAD status post CABG Hypertension -Troponin was 0.66 at [facility]. -Troponins 2532 then subsequently 3072 since admission. -Cardiology following and planning for cardiac catheterization possibly today.  Continue heparin.  Continue aspirin, metoprolol and statin.  Peripheral vascular disease Carotid artery disease -Continue aspirin and statin  CKD stage IIIa -Renal function possibly at baseline.  Monitor.  Leukocytosis -Monitor  Anemia of chronic disease -Possibly from chronic kidney disease.  Hemoglobin currently stable.  Monitor intermittently.  Acute on chronic diastolic heart  failure -Presented with volume overload with lower extremity edema and vascular congestion on imaging.  Diuretics as per cardiology recommendations.  Received a dose of IV Lasix on 08/25/2023. -Strict input and output.  Daily weights.  Fluid restriction.  Follow 2D echo.  Diabetes mellitus type 2 with hyperglycemia -A1c 7.3.  Continue CBGs with SSI.  GERD -Continue Protonix  DVT prophylaxis: Heparin drip Code Status: Full Family Communication: None at bedside Disposition Plan: Status is: Inpatient Remains inpatient appropriate because: Of severity of illness  Consultants: Cardiology/pulmonary  Procedures: None  Antimicrobials: None   Subjective: Patient seen and examined at bedside.  Feels slightly better.  Still short of breath with exertion.  Denies any current chest pain.  No fever or vomiting reported.  Objective: Vitals:   08/26/23 0123 08/26/23 0550 08/26/23 0745 08/26/23 0917  BP: (!) 128/59 (!) 147/61  (!) 145/73  Pulse: 92 98 89 87  Resp: 20 20 18 19   Temp: 98 F (36.7 C) 97.9 F (36.6 C)  98.6 F (37 C)  TempSrc: Oral Oral  Oral  SpO2: 94% 94% 100% 100%  Weight:  73.6 kg    Height:        Intake/Output Summary (Last 24 hours) at 08/26/2023 0953 Last data filed at 08/25/2023 2300 Gross per 24 hour  Intake 53 ml  Output 1000 ml  Net -947 ml   Filed Weights   08/25/23 1402 08/26/23 0550  Weight: 74.2 kg 73.6 kg    Examination:  General exam: Appears calm and comfortable.  On 2 L oxygen by nasal cannula.  Elderly male sitting on bed. Respiratory system: Bilateral decreased breath sounds at bases with scattered crackles Cardiovascular system: S1 & S2 heard, Rate controlled  Gastrointestinal system: Abdomen is nondistended, soft and nontender. Normal bowel sounds heard. Extremities: No cyanosis, clubbing; trace lower extremity edema Central nervous system: Alert and oriented. No focal neurological deficits. Moving extremities Skin: No rashes, lesions or  ulcers Psychiatry: Flat affect.  Not agitated.   Data Reviewed: I have personally reviewed following labs and imaging studies  CBC: Recent Labs  Lab 08/25/23 1539 08/26/23 0340  WBC 7.4 13.1*  HGB 8.8* 8.0*  HCT 28.3* 25.8*  MCV 83.2 82.7  PLT 263 243   Basic Metabolic Panel: Recent Labs  Lab 08/25/23 1539 08/26/23 0340  NA 137 135  K 4.0 4.0  CL 104 101  CO2 18* 24  GLUCOSE 258* 189*  BUN 20 28*  CREATININE 1.65* 1.62*  CALCIUM 8.2* 8.1*  MG 1.7  --   PHOS 2.7  --    GFR: Estimated Creatinine Clearance: 30.1 mL/min (A) (by C-G formula based on SCr of 1.62 mg/dL (H)). Liver Function Tests: Recent Labs  Lab 08/25/23 1539  AST 81*  ALT 30  ALKPHOS 48  BILITOT 0.6  PROT 6.9  ALBUMIN 3.3*   No results for input(s): "LIPASE", "AMYLASE" in the last 168 hours. No results for input(s): "AMMONIA" in the last 168 hours. Coagulation Profile: Recent Labs  Lab 08/25/23 1539  INR 1.2   Cardiac Enzymes: No results for input(s): "CKTOTAL", "CKMB", "CKMBINDEX", "TROPONINI" in the last 168 hours. BNP (last 3 results) No results for input(s): "PROBNP" in the last 8760 hours. HbA1C: Recent Labs    08/25/23 1539  HGBA1C 7.3*   CBG: Recent Labs  Lab 08/25/23 1604 08/25/23 2152 08/26/23 0552  GLUCAP 257* 279* 160*   Lipid Profile: No results for input(s): "CHOL", "HDL", "LDLCALC", "TRIG", "CHOLHDL", "LDLDIRECT" in the last 72 hours. Thyroid Function Tests: No results for input(s): "TSH", "T4TOTAL", "FREET4", "T3FREE", "THYROIDAB" in the last 72 hours. Anemia Panel: No results for input(s): "VITAMINB12", "FOLATE", "FERRITIN", "TIBC", "IRON", "RETICCTPCT" in the last 72 hours. Sepsis Labs: Recent Labs  Lab 08/25/23 1539  PROCALCITON 0.37    Recent Results (from the past 240 hour(s))  SARS Coronavirus 2 by RT PCR (hospital order, performed in [facility] hospital lab) *cepheid single result test* Anterior Nasal Swab     Status: Abnormal   Collection Time:  08/25/23  2:13 PM   Specimen: Anterior Nasal Swab  Result Value Ref Range Status   SARS Coronavirus 2 by RT PCR POSITIVE (A) NEGATIVE Final    Comment: Performed at [facility], [address] [place], [place] [address]         Radiology Studies: DG Chest [place] 1 View  Result Date: 08/25/2023 CLINICAL DATA:  82 year old male with respiratory failure, hypoxia. EXAM: PORTABLE CHEST 1 VIEW COMPARISON:  Portable chest 08/25/2023 and earlier. FINDINGS: Portable AP semi upright view at 1438 hours. Mildly rotated to the right now. Chronic CABG. Calcified aortic atherosclerosis. Other mediastinal contours are within normal limits. Mildly improved lung volumes. Regressed pulmonary interstitial opacity bilaterally. Currently no pneumothorax, pleural effusion, acute or confluent pulmonary opacity identified. No acute osseous abnormality identified. Negative visible bowel gas. IMPRESSION: 1. Resolved pulmonary interstitial opacity since yesterday, favor resolved interstitial edema. 2. Chronic CABG.  No new cardiopulmonary abnormality. Electronically Signed   By: Odessa Fleming M.D.   On: 08/25/2023 16:44        Scheduled Meds:  arformoterol  15 mcg Nebulization BID   aspirin EC  81 mg Oral QPM   atorvastatin  40 mg Oral QPM  budesonide (PULMICORT) nebulizer solution  0.25 mg Nebulization BID   insulin aspart  0-15 Units Subcutaneous TID WC   insulin aspart  0-5 Units Subcutaneous QHS   ipratropium-albuterol  3 mL Nebulization QID   methylPREDNISolone (SOLU-MEDROL) injection  0.5 mg/kg Intravenous Q12H   Followed by   Melene Muller ON 08/28/2023] predniSONE  50 mg Oral Daily   metoprolol tartrate  25 mg Oral Q8H   pantoprazole  40 mg Oral Daily   Continuous Infusions:  sodium chloride 1 mL/kg/hr (08/26/23 0739)   heparin 800 Units/hr (08/25/23 1810)          Glade Lloyd, MD [facility] 08/26/2023, 9:53 AM

## 2023-08-26 NOTE — Progress Notes (Signed)
PHARMACY - ANTICOAGULATION Pharmacy Consult for Heparin Indication: NSTEMI Brief A/P: Heparin level 0.53- therapeutic.No signs of bleeding or overnight issues with the infusion per nursing. Continue Heparin at current rate   No Known Allergies  Patient Measurements: Height: 5\' 5"  (165.1 cm) Weight: 73.6 kg (162 lb 4.1 oz) IBW/kg (Calculated) : 61.5 Heparin Dosing Weight: total weight  Vital Signs: Temp: 97.9 F (36.6 C) (10/04 0550) Temp Source: Oral (10/04 0550) BP: 147/61 (10/04 0550) Pulse Rate: 98 (10/04 0550)  Labs: Recent Labs    08/25/23 1539 08/25/23 1547 08/25/23 1851 08/25/23 2225 08/26/23 0340  HGB 8.8*  --   --   --  8.0*  HCT 28.3*  --   --   --  25.8*  PLT 263  --   --   --  243  APTT 142*  --   --   --   --   LABPROT 15.4*  --   --   --   --   INR 1.2  --   --   --   --   HEPARINUNFRC  --  0.55  --  0.61 0.53  CREATININE 1.65*  --   --   --  1.62*  TROPONINIHS 2,532*  --  3,072*  --   --     Estimated Creatinine Clearance: 30.1 mL/min (A) (by C-G formula based on SCr of 1.62 mg/dL (H)).  Assessment: 82 yo male with NSTEMI for heparin.   Goal of Therapy:  Heparin level 0.3-0.7 units/ml Monitor platelets by anticoagulation protocol: Yes   Plan:  No change to heparin Monitor for signs of bleeding / issues with heparin infusion should be reported to Pharmacy as they arise.  Follow up on oral AC once appropriate.   Thank you   Greta Doom BS, PharmD, BCPS Clinical Pharmacist 08/26/2023 7:15 AM  Contact: [phone] after 3 PM  "Be curious, not judgmental..." -Debbora Dus

## 2023-08-26 NOTE — Progress Notes (Addendum)
Patient Name: Thomas Gray Date of Encounter: 08/26/2023 [facility] Cardiologist: Nanetta Batty, MD   Interval Summary  .    Patient reports that he's feeling well this morning. Denies chest pain both today and prior to admission. Confirms severe dyspnea on initial presentation but says since receiving breathing treatments, his respiratory status has felt baseline.   Vital Signs .    Vitals:   08/26/23 0123 08/26/23 0550 08/26/23 0745 08/26/23 0917  BP: (!) 128/59 (!) 147/61  (!) 145/73  Pulse: 92 98 89 87  Resp: 20 20 18 19   Temp: 98 F (36.7 C) 97.9 F (36.6 C)  98.6 F (37 C)  TempSrc: Oral Oral  Oral  SpO2: 94% 94% 100% 100%  Weight:  73.6 kg    Height:        Intake/Output Summary (Last 24 hours) at 08/26/2023 1049 Last data filed at 08/25/2023 2300 Gross per 24 hour  Intake 53 ml  Output 1000 ml  Net -947 ml      08/26/2023    5:50 AM 08/25/2023    2:02 PM 07/13/2023    1:42 PM  Last 3 Weights  Weight (lbs) 162 lb 4.1 oz 163 lb 9.3 oz 168 lb 12.8 oz  Weight (kg) 73.6 kg 74.2 kg 76.567 kg      Telemetry/ECG    Sinus rhythm, sinus tachycardia - Personally Reviewed  Physical Exam .   GEN: No acute distress.   Neck: No JVD Cardiac: RRR, no murmurs, rubs, or gallops.  Respiratory: bibasilar crackles. LLL diminished compared with right GI: Soft, nontender, non-distended  MS: 1-2+ LE edema right > left. Near baseline per patient with venous insufficiency and lymphedema.   Assessment & Plan .     Elevated troponin CAD s/p CABG Acute hypoxic respiratory failure  Patient admitted on 10/2 after presenting with acute dyspnea and hypoxia (initially required 8LPM O2). Respiratory status significantly improved with breathing treatments. Found to be COVID-19 positive. EKG with diffuse ST segment depression in lateral inferior leads and patient transferred from [facility] to [facility] for evaluation. HS troponin 2532->3072.   Patient comfortable appearing on exam.  Continues to deny chest pain and does not have any shortness of breath today. While EKG did show prominent ST depression, this could be a demand ischemia event rather than ACS. Repeat ECG today Trend troponin to peak Given HGB down to 8 today and ongoing AKI, with lack of chest pain recommend continuation of IV heparin and tentative plans for catheterization on Monday 10/7. If patient were to develop acute symptoms, would need more urgent ischemic evaluation.  Continue Atorvastatin 40mg  Continue Metoprolol  Continue ASA Echocardiogram today Will defer management of COVID-19 to primary team  Chronic venous insufficiency/lymphedema  History of bilateral swelling in his lower extremities that has been evaluated by vascular and felt to be more related to chronic venous insufficiency and lymphedema.  Given improvement in respiratory status and AKI, would hold further diuretics. TTE will be helpful in further clarifying volume status.    AKI  Creatinine 1.65->1.61. Continue to hold home ARB and Jardiance. As above, would also avoid further use of loop diuretics at this time.   Hypertension  BP slightly elevated this admission with home losartan held. Monitor closely and consider PRN hydralazine for BP >118mmHg systolic. Resume Arb with stabilization of renal function.    Per primary team: DM type II  For questions or updates, please contact [facility] Please consult [email or web address] for contact  info under        Signed, Thomas Gold, PA-C   Agree with note by Thomas Gold, PA-C  Patient well-known to me from the outpatient setting.  History of remote CABG.  Other problems outlined.  He apparently contracted COVID and had severe respiratory insufficiency.  His troponins went up to approximately 3000.  To have ST segment depression.  He is moderately anemic with moderate renal insufficiency.  He denies chest pain.  At this point, I do not see indication for urgent cath.  Will  continue IV heparin, check 2D echo, treatment for COVID per primary team.  We will decide in the next several days whether to proceed with coronary angiography.  Runell Gess, M.D., FACP, [facility], [facility] [address] [place], [place]  [address]  [phone] 08/26/2023 3:15 PM

## 2023-08-26 NOTE — Progress Notes (Signed)
PT Cancellation Note  Patient Details Name: Thomas Gray MRN: [id number] DOB: [birth year]   Cancelled Treatment:    Reason Eval/Treat Not Completed: Patient not medically ready - pt troponin continues to elevate, will wait until after cath.   Marye Round, PT DPT Acute Rehabilitation Services Secure Chat Preferred  Office [phone]    Fredrica Capano Sheliah Plane 08/26/2023, 1:15 PM

## 2023-08-26 NOTE — Progress Notes (Signed)
  Echocardiogram 2D Echocardiogram has been performed.  Thomas Gray 08/26/2023, 1:08 PM

## 2023-08-26 NOTE — Progress Notes (Signed)
PHARMACY - ANTICOAGULATION Pharmacy Consult for Heparin Indication: NSTEMI Brief A/P: Heparin level 0.53- therapeutic.No signs of bleeding or overnight issues with the infusion per nursing. Continue Heparin at current rate   No Known Allergies  Patient Measurements: Height: 5\' 5"  (165.1 cm) Weight: 73.6 kg (162 lb 4.1 oz) IBW/kg (Calculated) : 61.5 Heparin Dosing Weight: total weight  Vital Signs: Temp: 97.9 F (36.6 C) (10/04 0550) Temp Source: Oral (10/04 0550) BP: 147/61 (10/04 0550) Pulse Rate: 98 (10/04 0550)  Labs: Recent Labs    08/25/23 1539 08/25/23 1547 08/25/23 1851 08/25/23 2225 08/26/23 0340  HGB 8.8*  --   --   --  8.0*  HCT 28.3*  --   --   --  25.8*  PLT 263  --   --   --  243  APTT 142*  --   --   --   --   LABPROT 15.4*  --   --   --   --   INR 1.2  --   --   --   --   HEPARINUNFRC  --  0.55  --  0.61 0.53  CREATININE 1.65*  --   --   --  1.62*  TROPONINIHS 2,532*  --  3,072*  --   --     Estimated Creatinine Clearance: 30.1 mL/min (A) (by C-G formula based on SCr of 1.62 mg/dL (H)).  Assessment: 82 yo male with NSTEMI for heparin.   Goal of Therapy:  Heparin level 0.3-0.7 units/ml Monitor platelets by anticoagulation protocol: Yes   Plan:  No change to heparin Monitor for signs of bleeding / issues with heparin infusion should be reported to Pharmacy as they arise.  Follow up on oral AC, if warranted  Thank you   Greta Doom BS, PharmD, BCPS Clinical Pharmacist 08/26/2023 7:20 AM  Contact: [phone] after 3 PM  "Be curious, not judgmental..." -Debbora Dus

## 2023-08-26 NOTE — Evaluation (Addendum)
Occupational Therapy Evaluation Patient Details Name: Thomas Gray MRN: [id number] DOB: [birth year] Today's Date: 08/26/2023   History of Present Illness Pt presenting 10/03 from [place] COVID, respiratory failure and elevated troponin. CXR demonstrates pulmonary edema. EKG with sinus tachy and first degree AV block. PMH significant for COPD, CAD s/p CABG, discoid lupus erythematosus, HTN, HLD, GERD, history of smoking.   Clinical Impression   RN providing clearance to see pt reporting he has been up moving independently in his room; RN states no need for hold eval until cath lab. PTA, pt lived alone independently. Upon eval, pt requiring 2L O2 via [place]. Pt with fair activity tolerance; had just received breathing treatment on arrival. Performing BADL with mod I. Pt to benefit from one more session to provide further education. Do not suspect need for follow up therapy after discharge.       If plan is discharge home, recommend the following: Other (comment) (on pt request)    Functional Status Assessment  Patient has had a recent decline in their functional status and demonstrates the ability to make significant improvements in function in a reasonable and predictable amount of time.  Equipment Recommendations  None recommended by OT    Recommendations for Other Services       Precautions / Restrictions Restrictions Weight Bearing Restrictions: No      Mobility Bed Mobility               General bed mobility comments: EOB on arrival and departure    Transfers Overall transfer level: Modified independent                        Balance                                           ADL either performed or assessed with clinical judgement   ADL Overall ADL's : Modified independent                                       General ADL Comments: increased time. After 2 standing gropoming tasks, SpO2 reading on pulse ox was 85,  howeever, number for HR was but consistent with HR on tele; retook on another finger and SpO2 98.     Vision Baseline Vision/History: 0 No visual deficits Ability to See in Adequate Light: 0 Adequate Patient Visual Report: No change from baseline Vision Assessment?: Wears glasses for reading Additional Comments: cataract surgery within the last year     Perception Perception: Within Functional Limits       Praxis Praxis: [facility]       Pertinent Vitals/Pain Pain Assessment Pain Assessment: No/denies pain     Extremity/Trunk Assessment Upper Extremity Assessment Upper Extremity Assessment: Right hand dominant;Overall [facility] for tasks assessed (5/5 bilaterally)   Lower Extremity Assessment Lower Extremity Assessment: Defer to PT evaluation       Communication Communication Communication: Hearing impairment (hard of hearing)   Cognition Arousal: Alert Behavior During Therapy: WFL for tasks assessed/performed Overall Cognitive Status: Within Functional Limits for tasks assessed                                 General Comments: Good  delayed memory recall and problem solving during session. Most limited by being hard of hearing while participating in cognitive assessment     General Comments  Pt educated regarding concept of activity pacing. will plan to see for one more session to provide energy conservation education    Exercises     Shoulder Instructions      Home Living Family/patient expects to be discharged to:: Private residence Living Arrangements: Alone Available Help at Discharge: Friend(s);Family Type of Home: Other(Comment) (town home) Home Access: Level entry     Home Layout: Two level;Able to live on main level with bedroom/bathroom     Bathroom Shower/Tub: Producer, television/film/video: Standard     Home Equipment: None (non skid mat in shower)          Prior Functioning/Environment Prior Level of Function : Independent/Modified  Independent;Driving                        OT Problem List: Cardiopulmonary status limiting activity      OT Treatment/Interventions: Self-care/ADL training;Therapeutic exercise;DME and/or AE instruction;Energy conservation;Balance training;Patient/family education;Therapeutic activities    OT Goals(Current goals can be found in the care plan section) Acute Rehab OT Goals Patient Stated Goal: get better and go home OT Goal Formulation: With patient Time For Goal Achievement: 09/09/23 Potential to Achieve Goals: Good  OT Frequency: Other (comment) (one more session after cath lab)    Co-evaluation              AM-PAC OT "6 Clicks" Daily Activity     Outcome Measure Help from another person eating meals?: None Help from another person taking care of personal grooming?: None Help from another person toileting, which includes using toliet, bedpan, or urinal?: None Help from another person bathing (including washing, rinsing, drying)?: None Help from another person to put on and taking off regular upper body clothing?: None Help from another person to put on and taking off regular lower body clothing?: None 6 Click Score: 24   End of Session Nurse Communication: Mobility status  Activity Tolerance: Patient tolerated treatment well Patient left: with call bell/phone within reach;Other (comment) (EOB)  OT Visit Diagnosis: Other (comment) (decr activity tolerance)                Time: 5784-6962 OT Time Calculation (min): 24 min Charges:  OT General Charges $OT Visit: 1 Visit OT Evaluation $OT Eval Low Complexity: 1 Low OT Treatments $Self Care/Home Management : 8-22 mins  Thomas Gray, OTR/L [place] [facility] Acute Rehabilitation Office: [phone]   Thomas Gray 08/26/2023, 9:42 AM

## 2023-08-27 DIAGNOSIS — J9601 Acute respiratory failure with hypoxia: Secondary | ICD-10-CM

## 2023-08-27 DIAGNOSIS — Z951 Presence of aortocoronary bypass graft: Secondary | ICD-10-CM | POA: Diagnosis not present

## 2023-08-27 DIAGNOSIS — U071 COVID-19: Secondary | ICD-10-CM | POA: Diagnosis not present

## 2023-08-27 DIAGNOSIS — I2510 Atherosclerotic heart disease of native coronary artery without angina pectoris: Secondary | ICD-10-CM | POA: Diagnosis not present

## 2023-08-27 LAB — CBC
HCT: 27 % — ABNORMAL LOW (ref 39.0–52.0)
Hemoglobin: 8.2 g/dL — ABNORMAL LOW (ref 13.0–17.0)
MCH: 25.5 pg — ABNORMAL LOW (ref 26.0–34.0)
MCHC: 30.4 g/dL (ref 30.0–36.0)
MCV: 83.9 fL (ref 80.0–100.0)
Platelets: 268 10*3/uL (ref 150–400)
RBC: 3.22 MIL/uL — ABNORMAL LOW (ref 4.22–5.81)
RDW: 25.6 % — ABNORMAL HIGH (ref 11.5–15.5)
WBC: 17.7 10*3/uL — ABNORMAL HIGH (ref 4.0–10.5)
nRBC: 0.6 % — ABNORMAL HIGH (ref 0.0–0.2)

## 2023-08-27 LAB — FERRITIN: Ferritin: 37 ng/mL (ref 24–336)

## 2023-08-27 LAB — C-REACTIVE PROTEIN: CRP: 2.4 mg/dL — ABNORMAL HIGH (ref <1.0)

## 2023-08-27 LAB — COMPREHENSIVE METABOLIC PANEL
ALT: 43 U/L (ref 0–44)
AST: 177 U/L — ABNORMAL HIGH (ref 15–41)
Albumin: 2.7 g/dL — ABNORMAL LOW (ref 3.5–5.0)
Alkaline Phosphatase: 46 U/L (ref 38–126)
Anion gap: 9 (ref 5–15)
BUN: 31 mg/dL — ABNORMAL HIGH (ref 8–23)
CO2: 25 mmol/L (ref 22–32)
Calcium: 8 mg/dL — ABNORMAL LOW (ref 8.9–10.3)
Chloride: 102 mmol/L (ref 98–111)
Creatinine, Ser: 1.35 mg/dL — ABNORMAL HIGH (ref 0.61–1.24)
GFR, Estimated: 52 mL/min — ABNORMAL LOW (ref >60)
Glucose, Bld: 178 mg/dL — ABNORMAL HIGH (ref 70–99)
Potassium: 4.4 mmol/L (ref 3.5–5.1)
Sodium: 136 mmol/L (ref 135–145)
Total Bilirubin: 0.6 mg/dL (ref 0.3–1.2)
Total Protein: 6 g/dL — ABNORMAL LOW (ref 6.5–8.1)

## 2023-08-27 LAB — GLUCOSE, CAPILLARY
Glucose-Capillary: 155 mg/dL — ABNORMAL HIGH (ref 70–99)
Glucose-Capillary: 214 mg/dL — ABNORMAL HIGH (ref 70–99)
Glucose-Capillary: 271 mg/dL — ABNORMAL HIGH (ref 70–99)
Glucose-Capillary: 171 mg/dL — ABNORMAL HIGH (ref 70–99)

## 2023-08-27 LAB — HEPARIN LEVEL (UNFRACTIONATED): Heparin Unfractionated: 0.48 [IU]/mL (ref 0.30–0.70)

## 2023-08-27 LAB — MAGNESIUM: Magnesium: 2 mg/dL (ref 1.7–2.4)

## 2023-08-27 MED ORDER — HEPARIN (PORCINE) 25000 UT/250ML-% IV SOLN
800.0000 [IU]/h | INTRAVENOUS | Status: DC
  Administered 2023-08-27 – 2023-08-29 (×2): 800 [IU]/h via INTRAVENOUS
  Filled 2023-08-27 (×2): qty 250

## 2023-08-27 MED ORDER — AMLODIPINE BESYLATE 5 MG PO TABS
5.0000 mg | ORAL_TABLET | Freq: Every day | ORAL | Status: DC
  Administered 2023-08-27 – 2023-09-02 (×7): 5 mg via ORAL
  Filled 2023-08-27 (×7): qty 1

## 2023-08-27 NOTE — Evaluation (Signed)
Physical Therapy Brief Evaluation and Discharge Note Patient Details Name: Thomas Gray MRN: [id number] DOB: 02-13-1941 Today's Date: 08/27/2023   History of Present Illness  Pt presenting 10/03 from [facility] COVID, respiratory failure and elevated troponin. CXR demonstrates pulmonary edema. EKG with sinus tachy and first degree AV block. PMH significant for COPD, CAD s/p CABG, discoid lupus erythematosus, HTN, HLD, GERD, history of smoking.  Clinical Impression   Pt presents with [facility] balance, mobility, strength vs baseline, states he feels at baseline currently except for need of 1LO2. Pt mobilized around the room x2 with mod I for increased time, pt does not use AD for balance. Pt states he has been mobilizing in the room independently and has no need for continued PT services at this time. PT to sign off, recommending mobility specialists follow to maintain pt independence with mobility and strength while acute. PT to sign off.     SpO2 98% on 1LO2 post-gait, no chest pain throughout session    PT Assessment Patient does not need any further PT services  Assistance Needed at Discharge  PRN    Equipment Recommendations None recommended by PT  Recommendations for Other Services       Precautions/Restrictions Precautions Precautions: None Restrictions Weight Bearing Restrictions: No        Mobility  Bed Mobility Rolling: Modified independent (Device/Increase time)        Transfers Overall transfer level: Modified independent                 General transfer comment: no AD    Ambulation/Gait Ambulation/Gait assistance: Modified independent (Device/Increase time) Gait Distance (Feet): 80 Feet Assistive device: None Gait Pattern/deviations: Step-through pattern, Decreased stride length, WFL(Within Functional Limits) Gait Speed: Below normal (slightly, anticipate WFL gait speed without obstacles) General Gait Details: wfl, slightly slowed speed given  navigating tight quarters in room  Home Activity Instructions    Stairs            Modified Rankin (Stroke Patients Only)        Balance Overall balance assessment: Modified Independent                        Pertinent Vitals/Pain   Pain Assessment Pain Assessment: No/denies pain     Home Living Family/patient expects to be discharged to:: Private residence Living Arrangements: Alone Available Help at Discharge: Friend(s);Available PRN/intermittently Home Environment: Level entry   Home Equipment: None        Prior Function Level of Independence: Independent      UE/LE Assessment   UE ROM/Strength/Tone/Coordination: WFL    LE ROM/Strength/Tone/Coordination: [facility]      Communication   Communication Communication: Hearing impairment Cueing Techniques: Verbal cues     Cognition Overall Cognitive Status: Appears within functional limits for tasks assessed/performed       General Comments      Exercises     Assessment/Plan    PT Problem List         PT Visit Diagnosis Other abnormalities of gait and mobility (R26.89)    No Skilled PT Patient at baseline level of functioning;Patient is modified independent with all activity/mobility   Co-evaluation                AMPAC 6 Clicks Help needed turning from your back to your side while in a flat bed without using bedrails?: None Help needed moving from lying on your back to sitting on the side of a  flat bed without using bedrails?: None Help needed moving to and from a bed to a chair (including a wheelchair)?: None Help needed standing up from a chair using your arms (e.g., wheelchair or bedside chair)?: None Help needed to walk in hospital room?: None Help needed climbing 3-5 steps with a railing? : A Little 6 Click Score: 23      End of Session Equipment Utilized During Treatment: Oxygen (1LO2) Activity Tolerance: Patient tolerated treatment well Patient left: in bed;with  call bell/phone within reach Nurse Communication: Mobility status PT Visit Diagnosis: Other abnormalities of gait and mobility (R26.89)     Time: 1350-1407 PT Time Calculation (min) (ACUTE ONLY): 17 min  Charges:   PT Evaluation $PT Eval Low Complexity: 1 Low      Shilynn Hoch S, PT DPT Acute Rehabilitation Services Secure Chat Preferred  Office [phone]   Javonte Elenes E Stroup  08/27/2023, 3:29 PM

## 2023-08-27 NOTE — Plan of Care (Signed)

## 2023-08-27 NOTE — Progress Notes (Signed)
PROGRESS NOTE    Thomas Gray  ZOX:[id number] DOB: 03-02-1941 DOA: 08/25/2023 PCP: Olive Bass, MD   Brief Narrative:  82 y.o. male, with past medical history of heavy smoking in the past, quit 13 years ago, COPD, not home oxygen, history of CAD status post CABG, carotid artery disease, discoid lupus erythematosus, hypertension, hyperlipidemia and GERD was transferred from [facility] due to cough, shortness of breath with concern for non-STEMI.  Patient also tested positive for COVID.  On initial presentation, he required 8 L oxygen but subsequently required 2 L of oxygen.  In [facility], troponin was 0.66, lactic acid of 3.7, BNP of 2590 and chest x-ray suggestive of vascular congestion.  He was started on heparin drip.  Cardiology was consulted.  Pulmonary was consulted as well.  Assessment & Plan:   Acute respiratory failure with hypoxia COVID-19 infection COPD exacerbation -Initially required 8 L oxygen by nasal cannula.  Currently on 1 L oxygen.  Tested positive for COVID.  Chest x-ray on presentation showed resolved pulmonary edema.  Pulmonary evaluated the patient on 08/25/2023 and signed off. -Continue Solu-Medrol.  Continue nebs. -Wean off as able. -Continue isolation.  No need for Paxlovid and remdesivir at this time.   Non-STEMI in a patient with history of CAD status post CABG Hypertension -Troponin was 0.66 at [facility]. -Troponins peaked to 11,782 since admission -Cardiology following and planning for cardiac catheterization at some point.  Echo showed EF of 55 to 60% with mild to moderate mitral regurgitation and moderate tricuspid regurgitation.  Continue heparin.  Continue aspirin, metoprolol and statin.  Peripheral vascular disease Carotid artery disease -Continue aspirin and statin  CKD stage IIIa -Renal function possibly at baseline.  Monitor.  Leukocytosis -Possibly worsening from steroid use.  Monitor.  Anemia of chronic  disease -Possibly from chronic kidney disease.  Hemoglobin currently stable.  Monitor intermittently.  Acute on chronic diastolic heart failure -Presented with volume overload with lower extremity edema and vascular congestion on imaging.  Diuretics as per cardiology recommendations.  Received a dose of IV Lasix on 08/25/2023. -Strict input and output.  Daily weights.  Fluid restriction.  2D echo as above.  Diabetes mellitus type 2 with hyperglycemia -A1c 7.3.  Continue CBGs with SSI.  GERD -Continue Protonix  DVT prophylaxis: Heparin drip Code Status: Full Family Communication: None at bedside Disposition Plan: Status is: Inpatient Remains inpatient appropriate because: Of severity of illness  Consultants: Cardiology/pulmonary  Procedures: 2D echo  Antimicrobials: None   Subjective: Patient seen and examined at bedside.  Denies any chest pain, fever or vomiting.  Still short of breath with exertion. Objective: Vitals:   08/26/23 1535 08/26/23 2019 08/27/23 0102 08/27/23 0610  BP: (!) 143/49 (!) 156/65 (!) 146/43 (!) 152/63  Pulse: 90 97 80 85  Resp: 18 19 18 18   Temp: 98.2 F (36.8 C) 97.6 F (36.4 C) 98 F (36.7 C)   TempSrc: Oral Oral Oral   SpO2: 96% 98% 97%   Weight:      Height:        Intake/Output Summary (Last 24 hours) at 08/27/2023 0824 Last data filed at 08/27/2023 8119 Gross per 24 hour  Intake 217.14 ml  Output 1575 ml  Net -1357.86 ml   Filed Weights   08/25/23 1402 08/26/23 0550  Weight: 74.2 kg 73.6 kg    Examination:  General: On 1 L oxygen via nasal cannula.  No distress.  Looks chronically ill and deconditioned ENT/neck: No thyromegaly.  JVD  is not elevated  respiratory: Decreased breath sounds at bases bilaterally with some crackles; no wheezing  CVS: S1-S2 heard, rate controlled currently Abdominal: Soft, nontender, slightly distended; no organomegaly,  bowel sounds are heard Extremities: Trace lower extremity edema; no cyanosis   CNS: Awake and alert.  Slow to respond.  No focal neurologic deficit.  Moves extremities Lymph: No obvious lymphadenopathy Skin: No obvious ecchymosis/lesions  psych: Currently not agitated.  Mostly flat affect.   Musculoskeletal: No obvious joint swelling/deformity    Data Reviewed: I have personally reviewed following labs and imaging studies  CBC: Recent Labs  Lab 08/25/23 1539 08/26/23 0340 08/27/23 0318  WBC 7.4 13.1* 17.7*  HGB 8.8* 8.0* 8.2*  HCT 28.3* 25.8* 27.0*  MCV 83.2 82.7 83.9  PLT 263 243 268   Basic Metabolic Panel: Recent Labs  Lab 08/25/23 1539 08/26/23 0340 08/27/23 0318  NA 137 135 136  K 4.0 4.0 4.4  CL 104 101 102  CO2 18* 24 25  GLUCOSE 258* 189* 178*  BUN 20 28* 31*  CREATININE 1.65* 1.62* 1.35*  CALCIUM 8.2* 8.1* 8.0*  MG 1.7  --  2.0  PHOS 2.7  --   --    GFR: Estimated Creatinine Clearance: 36.1 mL/min (A) (by C-G formula based on SCr of 1.35 mg/dL (H)). Liver Function Tests: Recent Labs  Lab 08/25/23 1539 08/27/23 0318  AST 81* 177*  ALT 30 43  ALKPHOS 48 46  BILITOT 0.6 0.6  PROT 6.9 6.0*  ALBUMIN 3.3* 2.7*   No results for input(s): "LIPASE", "AMYLASE" in the last 168 hours. No results for input(s): "AMMONIA" in the last 168 hours. Coagulation Profile: Recent Labs  Lab 08/25/23 1539  INR 1.2   Cardiac Enzymes: No results for input(s): "CKTOTAL", "CKMB", "CKMBINDEX", "TROPONINI" in the last 168 hours. BNP (last 3 results) No results for input(s): "PROBNP" in the last 8760 hours. HbA1C: Recent Labs    08/25/23 1539  HGBA1C 7.3*   CBG: Recent Labs  Lab 08/26/23 0552 08/26/23 1130 08/26/23 1619 08/26/23 2151 08/27/23 0607  GLUCAP 160* 170* 222* 183* 155*   Lipid Profile: No results for input(s): "CHOL", "HDL", "LDLCALC", "TRIG", "CHOLHDL", "LDLDIRECT" in the last 72 hours. Thyroid Function Tests: No results for input(s): "TSH", "T4TOTAL", "FREET4", "T3FREE", "THYROIDAB" in the last 72 hours. Anemia  Panel: Recent Labs    08/27/23 0318  FERRITIN 37   Sepsis Labs: Recent Labs  Lab 08/25/23 1539  PROCALCITON 0.37    Recent Results (from the past 240 hour(s))  SARS Coronavirus 2 by RT PCR (hospital order, performed in [facility] hospital lab) *cepheid single result test* Anterior Nasal Swab     Status: Abnormal   Collection Time: 08/25/23  2:13 PM   Specimen: Anterior Nasal Swab  Result Value Ref Range Status   SARS Coronavirus 2 by RT PCR POSITIVE (A) NEGATIVE Final    Comment: Performed at [facility], [address], [place], [place] [address]         Radiology Studies: ECHOCARDIOGRAM COMPLETE  Result Date: 08/26/2023    ECHOCARDIOGRAM REPORT   Patient Name:   KEM ALESSI Date of Exam: 08/26/2023 Medical Rec #:  [id number]   Height:       65.0 in Accession #:    [id number]  Weight:       162.3 lb Date of Birth:  [birth year]   BSA:          1.810 m Patient Age:    45  years    BP:           145/73 mmHg Patient Gender: M           HR:           87 bpm. Exam Location:  Inpatient Procedure: 2D Echo, Cardiac Doppler and Color Doppler Indications:    NSTEMI  History:        Patient has prior history of Echocardiogram examinations, most                 recent 05/26/2020. CAD, Prior CABG, COPD and COVID. Chronic kidney                 disease; Risk Factors:Diabetes.  Sonographer:    Delcie Roch RDCS Referring Phys: 4 DAWOOD S ELGERGAWY  Sonographer Comments: Image acquisition challenging due to respiratory motion. IMPRESSIONS  1. Left ventricular ejection fraction, by estimation, is 55 to 60%. The left ventricle has normal function. The left ventricle has no regional wall motion abnormalities. There is mild concentric left ventricular hypertrophy. Left ventricular diastolic parameters are indeterminate.  2. Right ventricular systolic function is normal. The right ventricular size is normal. There is moderately elevated pulmonary artery systolic pressure.  3. Left atrial size was  mildly dilated.  4. The mitral valve is normal in structure. Mild to moderate mitral valve regurgitation. No evidence of mitral stenosis.  5. Tricuspid valve regurgitation is moderate.  6. The aortic valve is normal in structure. Aortic valve regurgitation is trivial. Aortic valve sclerosis is present, with no evidence of aortic valve stenosis.  7. The inferior vena cava is normal in size with greater than 50% respiratory variability, suggesting right atrial pressure of 3 mmHg. FINDINGS  Left Ventricle: Left ventricular ejection fraction, by estimation, is 55 to 60%. The left ventricle has normal function. The left ventricle has no regional wall motion abnormalities. The left ventricular internal cavity size was normal in size. There is  mild concentric left ventricular hypertrophy. Left ventricular diastolic parameters are indeterminate. Right Ventricle: The right ventricular size is normal. No increase in right ventricular wall thickness. Right ventricular systolic function is normal. There is moderately elevated pulmonary artery systolic pressure. The tricuspid regurgitant velocity is 3.43 m/s, and with an assumed right atrial pressure of 3 mmHg, the estimated right ventricular systolic pressure is 50.1 mmHg. Left Atrium: Left atrial size was mildly dilated. Right Atrium: Right atrial size was normal in size. Pericardium: There is no evidence of pericardial effusion. Mitral Valve: The mitral valve is normal in structure. Mild to moderate mitral valve regurgitation. No evidence of mitral valve stenosis. Tricuspid Valve: The tricuspid valve is normal in structure. Tricuspid valve regurgitation is moderate . No evidence of tricuspid stenosis. Aortic Valve: The aortic valve is normal in structure. Aortic valve regurgitation is trivial. Aortic valve sclerosis is present, with no evidence of aortic valve stenosis. Pulmonic Valve: The pulmonic valve was normal in structure. Pulmonic valve regurgitation is not  visualized. No evidence of pulmonic stenosis. Aorta: The aortic root is normal in size and structure. Venous: The inferior vena cava is normal in size with greater than 50% respiratory variability, suggesting right atrial pressure of 3 mmHg. IAS/Shunts: No atrial level shunt detected by color flow Doppler. Additional Comments: A device lead is visualized.  LEFT VENTRICLE PLAX 2D LVIDd:         3.80 cm   Diastology LVIDs:         2.30 cm   LV e' medial:  5.55 cm/s LV PW:         1.20 cm   LV E/e' medial:  20.0 LV IVS:        1.20 cm   LV e' lateral:   6.53 cm/s LVOT diam:     1.80 cm   LV E/e' lateral: 17.0 LV SV:         45 LV SV Index:   25 LVOT Area:     2.54 cm  RIGHT VENTRICLE            IVC RV Basal diam:  2.90 cm    IVC diam: 2.00 cm RV S prime:     9.46 cm/s TAPSE (M-mode): 1.4 cm LEFT ATRIUM             Index        RIGHT ATRIUM           Index LA diam:        4.40 cm 2.43 cm/m   RA Area:     14.50 cm LA Vol (A2C):   46.7 ml 25.80 ml/m  RA Volume:   32.90 ml  18.18 ml/m LA Vol (A4C):   67.6 ml 37.35 ml/m LA Biplane Vol: 57.6 ml 31.82 ml/m  AORTIC VALVE LVOT Vmax:   88.30 cm/s LVOT Vmean:  60.600 cm/s LVOT VTI:    0.176 m  AORTA Ao Root diam: 3.10 cm Ao Asc diam:  3.30 cm MITRAL VALVE                TRICUSPID VALVE MV Area (PHT): 4.31 cm     TR Peak grad:   47.1 mmHg MV Decel Time: 176 msec     TR Vmax:        343.00 cm/s MV E velocity: 111.00 cm/s MV A velocity: 57.50 cm/s   SHUNTS MV E/A ratio:  1.93         Systemic VTI:  0.18 m                             Systemic Diam: 1.80 cm Kardie Tobb DO Electronically signed by Thomasene Ripple DO Signature Date/Time: 08/26/2023/1:38:07 PM    Final    DG Chest Port 1 View  Result Date: 08/25/2023 CLINICAL DATA:  82 year old male with respiratory failure, hypoxia. EXAM: PORTABLE CHEST 1 VIEW COMPARISON:  Portable chest 08/25/2023 and earlier. FINDINGS: Portable AP semi upright view at 1438 hours. Mildly rotated to the right now. Chronic CABG. Calcified aortic  atherosclerosis. Other mediastinal contours are within normal limits. Mildly improved lung volumes. Regressed pulmonary interstitial opacity bilaterally. Currently no pneumothorax, pleural effusion, acute or confluent pulmonary opacity identified. No acute osseous abnormality identified. Negative visible bowel gas. IMPRESSION: 1. Resolved pulmonary interstitial opacity since yesterday, favor resolved interstitial edema. 2. Chronic CABG.  No new cardiopulmonary abnormality. Electronically Signed   By: Odessa Fleming M.D.   On: 08/25/2023 16:44        Scheduled Meds:  arformoterol  15 mcg Nebulization BID   aspirin EC  81 mg Oral QPM   atorvastatin  40 mg Oral QPM   budesonide (PULMICORT) nebulizer solution  0.25 mg Nebulization BID   insulin aspart  0-15 Units Subcutaneous TID WC   insulin aspart  0-5 Units Subcutaneous QHS   ipratropium-albuterol  3 mL Nebulization BID   methylPREDNISolone (SOLU-MEDROL) injection  0.5 mg/kg Intravenous Q12H   Followed by   Melene Muller ON 08/28/2023] predniSONE  50  mg Oral Daily   metoprolol tartrate  25 mg Oral Q8H   pantoprazole  40 mg Oral Daily   Continuous Infusions:  heparin 800 Units/hr (08/26/23 1122)          Glade Lloyd, MD [facility] 08/27/2023, 8:24 AM

## 2023-08-27 NOTE — Progress Notes (Signed)
PHARMACY - ANTICOAGULATION Pharmacy Consult for Heparin Indication: NSTEMI   No Known Allergies  Patient Measurements: Height: 5\' 5"  (165.1 cm) Weight: 73.6 kg (162 lb 4.1 oz) IBW/kg (Calculated) : 61.5 Heparin Dosing Weight: total weight  Vital Signs: Temp: 98 F (36.7 C) (10/05 0102) Temp Source: Oral (10/05 0102) BP: 152/63 (10/05 0610) Pulse Rate: 85 (10/05 0610)  Labs: Recent Labs    08/25/23 1539 08/25/23 1547 08/25/23 1851 08/25/23 2225 08/26/23 0340 08/26/23 1105 08/26/23 1300 08/27/23 0318  HGB 8.8*  --   --   --  8.0*  --   --  8.2*  HCT 28.3*  --   --   --  25.8*  --   --  27.0*  PLT 263  --   --   --  243  --   --  268  APTT 142*  --   --   --   --   --   --   --   LABPROT 15.4*  --   --   --   --   --   --   --   INR 1.2  --   --   --   --   --   --   --   HEPARINUNFRC  --    < >  --  0.61 0.53  --   --  0.48  CREATININE 1.65*  --   --   --  1.62*  --   --  1.35*  TROPONINIHS 2,532*  --  3,072*  --   --  40,981* 10,836*  --    < > = values in this interval not displayed.    Estimated Creatinine Clearance: 36.1 mL/min (A) (by C-G formula based on SCr of 1.35 mg/dL (H)).  Assessment: 82 yo male with presented with NSTEMI. PMH of CAD s/p CABG. No anticoagulation PTA. Pharmacy consulted for heparin management.   Heparin level 0.48 is therapeutic with heparin running at 800 units/hr. Hgb (8.2) and PLTs (268) are stable. Per RN, no report of pauses, issues with the line, or signs of bleeding.   Goal of Therapy:  Heparin level 0.3-0.7 units/ml Monitor platelets by anticoagulation protocol: Yes   Plan:  Continue heparin infusion at 800 units/hr Monitor daily heparin level and CBC Monitor for signs/symptoms of bleeding F/u plan for [facility] on Monday 10/7   Romie Minus, PharmD PGY1 Pharmacy Resident  Please check AMION for all [facility] Pharmacy phone numbers After 10:00 PM, call Main Pharmacy [phone]

## 2023-08-27 NOTE — Progress Notes (Addendum)
Progress Note  Patient Name: Thomas Gray Date of Encounter: 08/27/2023  Primary Cardiologist: Nanetta Batty, MD   Subjective   Patient seen examined his bedside.  He was getting his breathing treatment when I arrived.  No complaints at this time.  Inpatient Medications    Scheduled Meds:  arformoterol  15 mcg Nebulization BID   aspirin EC  81 mg Oral QPM   atorvastatin  40 mg Oral QPM   budesonide (PULMICORT) nebulizer solution  0.25 mg Nebulization BID   insulin aspart  0-15 Units Subcutaneous TID WC   insulin aspart  0-5 Units Subcutaneous QHS   ipratropium-albuterol  3 mL Nebulization BID   methylPREDNISolone (SOLU-MEDROL) injection  0.5 mg/kg Intravenous Q12H   Followed by   Melene Muller ON 08/28/2023] predniSONE  50 mg Oral Daily   metoprolol tartrate  25 mg Oral Q8H   pantoprazole  40 mg Oral Daily   Continuous Infusions:  heparin 800 Units/hr (08/26/23 1122)   PRN Meds: acetaminophen **OR** acetaminophen, albuterol, hydrALAZINE   Vital Signs    Vitals:   08/26/23 2019 08/27/23 0102 08/27/23 0610 08/27/23 0841  BP: (!) 156/65 (!) 146/43 (!) 152/63 (!) 166/67  Pulse: 97 80 85 89  Resp: 19 18 18 18   Temp: 97.6 F (36.4 C) 98 F (36.7 C)  97.8 F (36.6 C)  TempSrc: Oral Oral  Oral  SpO2: 98% 97%  95%  Weight:      Height:        Intake/Output Summary (Last 24 hours) at 08/27/2023 0851 Last data filed at 08/27/2023 0842 Gross per 24 hour  Intake 217.14 ml  Output 1725 ml  Net -1507.86 ml   Filed Weights   08/25/23 1402 08/26/23 0550  Weight: 74.2 kg 73.6 kg    Telemetry    Sinus rhythm  - Personally Reviewed  ECG    None  - Personally Reviewed  Physical Exam    General: Comfortable Head: Atraumatic, normal size  Eyes: PEERLA, EOMI  Neck: Supple, normal JVD Cardiac: Normal S1, S2; RRR; no murmurs, rubs, or gallops Lungs: Clear to auscultation bilaterally Abd: Soft, nontender, no hepatomegaly  Ext: warm, no edema Musculoskeletal: No  deformities, BUE and BLE strength normal and equal Skin: Warm and dry, no rashes   Neuro: Alert and oriented to person, place, time, and situation, CNII-XII grossly intact, no focal deficits  Psych: Normal mood and affect   Labs    Chemistry Recent Labs  Lab 08/25/23 1539 08/26/23 0340 08/27/23 0318  NA 137 135 136  K 4.0 4.0 4.4  CL 104 101 102  CO2 18* 24 25  GLUCOSE 258* 189* 178*  BUN 20 28* 31*  CREATININE 1.65* 1.62* 1.35*  CALCIUM 8.2* 8.1* 8.0*  PROT 6.9  --  6.0*  ALBUMIN 3.3*  --  2.7*  AST 81*  --  177*  ALT 30  --  43  ALKPHOS 48  --  46  BILITOT 0.6  --  0.6  GFRNONAA 41* 42* 52*  ANIONGAP 15 10 9      Hematology Recent Labs  Lab 08/25/23 1539 08/26/23 0340 08/27/23 0318  WBC 7.4 13.1* 17.7*  RBC 3.40* 3.12* 3.22*  HGB 8.8* 8.0* 8.2*  HCT 28.3* 25.8* 27.0*  MCV 83.2 82.7 83.9  MCH 25.9* 25.6* 25.5*  MCHC 31.1 31.0 30.4  RDW 25.0* 24.8* 25.6*  PLT 263 243 268    Cardiac EnzymesNo results for input(s): "TROPONINI" in the last 168 hours. No results for  input(s): "TROPIPOC" in the last 168 hours.   BNPNo results for input(s): "BNP", "PROBNP" in the last 168 hours.   DDimer No results for input(s): "DDIMER" in the last 168 hours.   Radiology    ECHOCARDIOGRAM COMPLETE  Result Date: 08/26/2023    ECHOCARDIOGRAM REPORT   Patient Name:   Thomas Gray Date of Exam: 08/26/2023 Medical Rec #:  [id number]   Height:       65.0 in Accession #:    [id number]  Weight:       162.3 lb Date of Birth:  [birth year]   BSA:          1.810 m Patient Age:    82 years    BP:           145/73 mmHg Patient Gender: M           HR:           87 bpm. Exam Location:  Inpatient Procedure: 2D Echo, Cardiac Doppler and Color Doppler Indications:    NSTEMI  History:        Patient has prior history of Echocardiogram examinations, most                 recent 05/26/2020. CAD, Prior CABG, COPD and COVID. Chronic kidney                 disease; Risk Factors:Diabetes.  Sonographer:    Delcie Roch RDCS Referring Phys: [id number] [address] S ELGER[place]W[address]  Sonographer Comments: Image acquisition challenging due to respiratory motion. IMPRESSIONS  1. Left ventricular ejection fraction, by estimation, is 55 to 60%. The left ventricle has normal function. The left ventricle has no regional wall motion abnormalities. There is mild concentric left ventricular hypertrophy. Left ventricular diastolic parameters are indeterminate.  2. Right ventricular systolic function is normal. The right ventricular size is normal. There is moderately elevated pulmonary artery systolic pressure.  3. Left atrial size was mildly dilated.  4. The mitral valve is normal in structure. Mild to moderate mitral valve regurgitation. No evidence of mitral stenosis.  5. Tricuspid valve regurgitation is moderate.  6. The aortic valve is normal in structure. Aortic valve regurgitation is trivial. Aortic valve sclerosis is present, with no evidence of aortic valve stenosis.  7. The inferior vena cava is normal in size with greater than 50% respiratory variability, suggesting right atrial pressure of 3 mmHg. FINDINGS  Left Ventricle: Left ventricular ejection fraction, by estimation, is 55 to 60%. The left ventricle has normal function. The left ventricle has no regional wall motion abnormalities. The left ventricular internal cavity size was normal in size. There is  mild concentric left ventricular hypertrophy. Left ventricular diastolic parameters are indeterminate. Right Ventricle: The right ventricular size is normal. No increase in right ventricular wall thickness. Right ventricular systolic function is normal. There is moderately elevated pulmonary artery systolic pressure. The tricuspid regurgitant velocity is 3.43 m/s, and with an assumed right atrial pressure of 3 mmHg, the estimated right ventricular systolic pressure is 50.1 mmHg. Left Atrium: Left atrial size was mildly dilated. Right Atrium: Right atrial size was normal in size.  Pericardium: There is no evidence of pericardial effusion. Mitral Valve: The mitral valve is normal in structure. Mild to moderate mitral valve regurgitation. No evidence of mitral valve stenosis. Tricuspid Valve: The tricuspid valve is normal in structure. Tricuspid valve regurgitation is moderate . No evidence of tricuspid stenosis. Aortic Valve: The aortic valve is normal in structure. Aortic valve  regurgitation is trivial. Aortic valve sclerosis is present, with no evidence of aortic valve stenosis. Pulmonic Valve: The pulmonic valve was normal in structure. Pulmonic valve regurgitation is not visualized. No evidence of pulmonic stenosis. Aorta: The aortic root is normal in size and structure. Venous: The inferior vena cava is normal in size with greater than 50% respiratory variability, suggesting right atrial pressure of 3 mmHg. IAS/Shunts: No atrial level shunt detected by color flow Doppler. Additional Comments: A device lead is visualized.  LEFT VENTRICLE PLAX 2D LVIDd:         3.80 cm   Diastology LVIDs:         2.30 cm   LV e' medial:    5.55 cm/s LV PW:         1.20 cm   LV E/e' medial:  20.0 LV IVS:        1.20 cm   LV e' lateral:   6.53 cm/s LVOT diam:     1.80 cm   LV E/e' lateral: 17.0 LV SV:         45 LV SV Index:   25 LVOT Area:     2.54 cm  RIGHT VENTRICLE            IVC RV Basal diam:  2.90 cm    IVC diam: 2.00 cm RV S prime:     9.46 cm/s TAPSE (M-mode): 1.4 cm LEFT ATRIUM             Index        RIGHT ATRIUM           Index LA diam:        4.40 cm 2.43 cm/m   RA Area:     14.50 cm LA Vol (A2C):   46.7 ml 25.80 ml/m  RA Volume:   32.90 ml  18.18 ml/m LA Vol (A4C):   67.6 ml 37.35 ml/m LA Biplane Vol: 57.6 ml 31.82 ml/m  AORTIC VALVE LVOT Vmax:   88.30 cm/s LVOT Vmean:  60.600 cm/s LVOT VTI:    0.176 m  AORTA Ao Root diam: 3.10 cm Ao Asc diam:  3.30 cm MITRAL VALVE                TRICUSPID VALVE MV Area (PHT): 4.31 cm     TR Peak grad:   47.1 mmHg MV Decel Time: 176 msec     TR Vmax:         343.00 cm/s MV E velocity: 111.00 cm/s MV A velocity: 57.50 cm/s   SHUNTS MV E/A ratio:  1.93         Systemic VTI:  0.18 m                             Systemic Diam: 1.80 cm Devin Foskey DO Electronically signed by Thomasene Ripple DO Signature Date/Time: 08/26/2023/1:38:07 PM    Final    DG Chest Port 1 View  Result Date: 08/25/2023 CLINICAL DATA:  82 year old male with respiratory failure, hypoxia. EXAM: PORTABLE CHEST 1 VIEW COMPARISON:  Portable chest 08/25/2023 and earlier. FINDINGS: Portable AP semi upright view at 1438 hours. Mildly rotated to the right now. Chronic CABG. Calcified aortic atherosclerosis. Other mediastinal contours are within normal limits. Mildly improved lung volumes. Regressed pulmonary interstitial opacity bilaterally. Currently no pneumothorax, pleural effusion, acute or confluent pulmonary opacity identified. No acute osseous abnormality identified. Negative visible bowel gas. IMPRESSION: 1. Resolved pulmonary interstitial  opacity since yesterday, favor resolved interstitial edema. 2. Chronic CABG.  No new cardiopulmonary abnormality. Electronically Signed   By: Odessa Fleming M.D.   On: 08/25/2023 16:44    Cardiac Studies   Reviewed echo - 08/26/2019  Patient Profile     82 y.o. male with history of coronary artery disease status post CABG, acute hypoxic respiratory failure, chronic venous insufficiency, hypertension, diabetes mellitus and recent COVID-19 infection.  Assessment & Plan    Coronary artery disease status post CABG with elevated troponin Acute hypoxic respiratory failure Chronic venous insufficiency Acute kidney injury-improving Hypertension Diabetes mellitus   Known coronary artery disease, now in the setting of COVID-19 infection troponin peaked to 11,782.  Agree with medical therapy for now and reconsidering cardiac catheterization later next week.  Continue heparin drip, aspirin, statin as well as beta-blocker.   Echocardiogram which was done yesterday  showed normal EF with no wall motion abnormalities.  Mild to moderate mitral regurgitation with moderate tricuspid regurgitation was noted as well as elevated pulmonary artery systolic pressure.  Thankfully kidney function is improving 1.35 today was 1.62 yesterday.  He is hypertensive, will restart his home amlodipine 5 mg daily.  White count seems to be trending upward, will defer to the primary team if need for any further.  Time Spent Directly with Patient:   I have spent a total of 40 with the patient reviewing notes, imaging, EKGs, labs and examining the patient as well as establishing an assessment and plan that was discussed personally with the patient.  > 50% of time was spent in direct patient care  and reviewing imaging with patient .      For questions or updates, please contact [facility] Please consult [email or web address] for contact info under Cardiology/STEMI.      Signed, Thomasene Ripple, DO  08/27/2023, 8:51 AM

## 2023-08-28 DIAGNOSIS — Z951 Presence of aortocoronary bypass graft: Secondary | ICD-10-CM | POA: Diagnosis not present

## 2023-08-28 DIAGNOSIS — U071 COVID-19: Secondary | ICD-10-CM | POA: Diagnosis not present

## 2023-08-28 DIAGNOSIS — J9601 Acute respiratory failure with hypoxia: Secondary | ICD-10-CM | POA: Diagnosis not present

## 2023-08-28 DIAGNOSIS — I2510 Atherosclerotic heart disease of native coronary artery without angina pectoris: Secondary | ICD-10-CM | POA: Diagnosis not present

## 2023-08-28 LAB — CBC
HCT: 24.8 % — ABNORMAL LOW (ref 39.0–52.0)
Hemoglobin: 7.7 g/dL — ABNORMAL LOW (ref 13.0–17.0)
MCH: 25.2 pg — ABNORMAL LOW (ref 26.0–34.0)
MCHC: 31 g/dL (ref 30.0–36.0)
MCV: 81.3 fL (ref 80.0–100.0)
Platelets: 268 10*3/uL (ref 150–400)
RBC: 3.05 MIL/uL — ABNORMAL LOW (ref 4.22–5.81)
RDW: 25.1 % — ABNORMAL HIGH (ref 11.5–15.5)
WBC: 10.9 10*3/uL — ABNORMAL HIGH (ref 4.0–10.5)
nRBC: 1 % — ABNORMAL HIGH (ref 0.0–0.2)

## 2023-08-28 LAB — COMPREHENSIVE METABOLIC PANEL
ALT: 48 U/L — ABNORMAL HIGH (ref 0–44)
AST: 151 U/L — ABNORMAL HIGH (ref 15–41)
Albumin: 2.7 g/dL — ABNORMAL LOW (ref 3.5–5.0)
Alkaline Phosphatase: 46 U/L (ref 38–126)
Anion gap: 8 (ref 5–15)
BUN: 32 mg/dL — ABNORMAL HIGH (ref 8–23)
CO2: 24 mmol/L (ref 22–32)
Calcium: 7.9 mg/dL — ABNORMAL LOW (ref 8.9–10.3)
Chloride: 102 mmol/L (ref 98–111)
Creatinine, Ser: 1.36 mg/dL — ABNORMAL HIGH (ref 0.61–1.24)
GFR, Estimated: 52 mL/min — ABNORMAL LOW (ref >60)
Glucose, Bld: 227 mg/dL — ABNORMAL HIGH (ref 70–99)
Potassium: 4.2 mmol/L (ref 3.5–5.1)
Sodium: 134 mmol/L — ABNORMAL LOW (ref 135–145)
Total Bilirubin: 0.6 mg/dL (ref 0.3–1.2)
Total Protein: 5.9 g/dL — ABNORMAL LOW (ref 6.5–8.1)

## 2023-08-28 LAB — GLUCOSE, CAPILLARY
Glucose-Capillary: 238 mg/dL — ABNORMAL HIGH (ref 70–99)
Glucose-Capillary: 321 mg/dL — ABNORMAL HIGH (ref 70–99)
Glucose-Capillary: 224 mg/dL — ABNORMAL HIGH (ref 70–99)
Glucose-Capillary: 190 mg/dL — ABNORMAL HIGH (ref 70–99)

## 2023-08-28 LAB — MAGNESIUM: Magnesium: 2.2 mg/dL (ref 1.7–2.4)

## 2023-08-28 LAB — C-REACTIVE PROTEIN: CRP: 3.1 mg/dL — ABNORMAL HIGH (ref <1.0)

## 2023-08-28 LAB — HEPARIN LEVEL (UNFRACTIONATED): Heparin Unfractionated: 0.36 [IU]/mL (ref 0.30–0.70)

## 2023-08-28 MED ORDER — INSULIN GLARGINE-YFGN 100 UNIT/ML ~~LOC~~ SOLN
10.0000 [IU] | Freq: Every day | SUBCUTANEOUS | Status: DC
  Administered 2023-08-28 – 2023-09-02 (×6): 10 [IU] via SUBCUTANEOUS
  Filled 2023-08-28 (×6): qty 0.1

## 2023-08-28 NOTE — Progress Notes (Signed)
162.3 lb Date of Birth:  1941-01-15   BSA:          1.810 m Patient Age:    82 years    BP:           145/73 mmHg Patient Gender: M           HR:           87 bpm. Exam Location:  Inpatient Procedure: 2D Echo, Cardiac Doppler and Color Doppler Indications:    NSTEMI  History:        Patient has prior history of Echocardiogram examinations, most                 recent 05/26/2020. CAD, Prior CABG, COPD and COVID. Chronic kidney                 disease; Risk Factors:Diabetes.  Sonographer:    Delcie Roch RDCS Referring Phys: 43 DAWOOD S ELGERGAWY  Sonographer Comments: Image acquisition challenging due to respiratory motion. IMPRESSIONS  1. Left ventricular ejection fraction, by estimation, is 55 to 60%. The left ventricle has normal function. The left ventricle has no regional wall motion abnormalities. There is mild concentric left ventricular hypertrophy. Left ventricular diastolic parameters are indeterminate.  2. Right ventricular systolic function is  normal. The right ventricular size is normal. There is moderately elevated pulmonary artery systolic pressure.  3. Left atrial size was mildly dilated.  4. The mitral valve is normal in structure. Mild to moderate mitral valve regurgitation. No evidence of mitral stenosis.  5. Tricuspid valve regurgitation is moderate.  6. The aortic valve is normal in structure. Aortic valve regurgitation is trivial. Aortic valve sclerosis is present, with no evidence of aortic valve stenosis.  7. The inferior vena cava is normal in size with greater than 50% respiratory variability, suggesting right atrial pressure of 3 mmHg. FINDINGS  Left Ventricle: Left ventricular ejection fraction, by estimation, is 55 to 60%. The left ventricle has normal function. The left ventricle has no regional wall motion abnormalities. The left ventricular internal cavity size was normal in size. There is  mild concentric left ventricular hypertrophy. Left ventricular diastolic parameters are indeterminate. Right Ventricle: The right ventricular size is normal. No increase in right ventricular wall thickness. Right ventricular systolic function is normal. There is moderately elevated pulmonary artery systolic pressure. The tricuspid regurgitant velocity is 3.43 m/s, and with an assumed right atrial pressure of 3 mmHg, the estimated right ventricular systolic pressure is 50.1 mmHg. Left Atrium: Left atrial size was mildly dilated. Right Atrium: Right atrial size was normal in size. Pericardium: There is no evidence of pericardial effusion. Mitral Valve: The mitral valve is normal in structure. Mild to moderate mitral valve regurgitation. No evidence of mitral valve stenosis. Tricuspid Valve: The tricuspid valve is normal in structure. Tricuspid valve regurgitation is moderate . No evidence of tricuspid stenosis. Aortic Valve: The aortic valve is normal in structure. Aortic valve regurgitation is trivial. Aortic valve sclerosis is present, with no  evidence of aortic valve stenosis. Pulmonic Valve: The pulmonic valve was normal in structure. Pulmonic valve regurgitation is not visualized. No evidence of pulmonic stenosis. Aorta: The aortic root is normal in size and structure. Venous: The inferior vena cava is normal in size with greater than 50% respiratory variability, suggesting right atrial pressure of 3 mmHg. IAS/Shunts: No atrial level shunt detected by color flow Doppler. Additional Comments: A device lead is visualized.  LEFT VENTRICLE PLAX 2D LVIDd:  162.3 lb Date of Birth:  1941-01-15   BSA:          1.810 m Patient Age:    82 years    BP:           145/73 mmHg Patient Gender: M           HR:           87 bpm. Exam Location:  Inpatient Procedure: 2D Echo, Cardiac Doppler and Color Doppler Indications:    NSTEMI  History:        Patient has prior history of Echocardiogram examinations, most                 recent 05/26/2020. CAD, Prior CABG, COPD and COVID. Chronic kidney                 disease; Risk Factors:Diabetes.  Sonographer:    Delcie Roch RDCS Referring Phys: 43 DAWOOD S ELGERGAWY  Sonographer Comments: Image acquisition challenging due to respiratory motion. IMPRESSIONS  1. Left ventricular ejection fraction, by estimation, is 55 to 60%. The left ventricle has normal function. The left ventricle has no regional wall motion abnormalities. There is mild concentric left ventricular hypertrophy. Left ventricular diastolic parameters are indeterminate.  2. Right ventricular systolic function is  normal. The right ventricular size is normal. There is moderately elevated pulmonary artery systolic pressure.  3. Left atrial size was mildly dilated.  4. The mitral valve is normal in structure. Mild to moderate mitral valve regurgitation. No evidence of mitral stenosis.  5. Tricuspid valve regurgitation is moderate.  6. The aortic valve is normal in structure. Aortic valve regurgitation is trivial. Aortic valve sclerosis is present, with no evidence of aortic valve stenosis.  7. The inferior vena cava is normal in size with greater than 50% respiratory variability, suggesting right atrial pressure of 3 mmHg. FINDINGS  Left Ventricle: Left ventricular ejection fraction, by estimation, is 55 to 60%. The left ventricle has normal function. The left ventricle has no regional wall motion abnormalities. The left ventricular internal cavity size was normal in size. There is  mild concentric left ventricular hypertrophy. Left ventricular diastolic parameters are indeterminate. Right Ventricle: The right ventricular size is normal. No increase in right ventricular wall thickness. Right ventricular systolic function is normal. There is moderately elevated pulmonary artery systolic pressure. The tricuspid regurgitant velocity is 3.43 m/s, and with an assumed right atrial pressure of 3 mmHg, the estimated right ventricular systolic pressure is 50.1 mmHg. Left Atrium: Left atrial size was mildly dilated. Right Atrium: Right atrial size was normal in size. Pericardium: There is no evidence of pericardial effusion. Mitral Valve: The mitral valve is normal in structure. Mild to moderate mitral valve regurgitation. No evidence of mitral valve stenosis. Tricuspid Valve: The tricuspid valve is normal in structure. Tricuspid valve regurgitation is moderate . No evidence of tricuspid stenosis. Aortic Valve: The aortic valve is normal in structure. Aortic valve regurgitation is trivial. Aortic valve sclerosis is present, with no  evidence of aortic valve stenosis. Pulmonic Valve: The pulmonic valve was normal in structure. Pulmonic valve regurgitation is not visualized. No evidence of pulmonic stenosis. Aorta: The aortic root is normal in size and structure. Venous: The inferior vena cava is normal in size with greater than 50% respiratory variability, suggesting right atrial pressure of 3 mmHg. IAS/Shunts: No atrial level shunt detected by color flow Doppler. Additional Comments: A device lead is visualized.  LEFT VENTRICLE PLAX 2D LVIDd:  162.3 lb Date of Birth:  1941-01-15   BSA:          1.810 m Patient Age:    82 years    BP:           145/73 mmHg Patient Gender: M           HR:           87 bpm. Exam Location:  Inpatient Procedure: 2D Echo, Cardiac Doppler and Color Doppler Indications:    NSTEMI  History:        Patient has prior history of Echocardiogram examinations, most                 recent 05/26/2020. CAD, Prior CABG, COPD and COVID. Chronic kidney                 disease; Risk Factors:Diabetes.  Sonographer:    Delcie Roch RDCS Referring Phys: 43 DAWOOD S ELGERGAWY  Sonographer Comments: Image acquisition challenging due to respiratory motion. IMPRESSIONS  1. Left ventricular ejection fraction, by estimation, is 55 to 60%. The left ventricle has normal function. The left ventricle has no regional wall motion abnormalities. There is mild concentric left ventricular hypertrophy. Left ventricular diastolic parameters are indeterminate.  2. Right ventricular systolic function is  normal. The right ventricular size is normal. There is moderately elevated pulmonary artery systolic pressure.  3. Left atrial size was mildly dilated.  4. The mitral valve is normal in structure. Mild to moderate mitral valve regurgitation. No evidence of mitral stenosis.  5. Tricuspid valve regurgitation is moderate.  6. The aortic valve is normal in structure. Aortic valve regurgitation is trivial. Aortic valve sclerosis is present, with no evidence of aortic valve stenosis.  7. The inferior vena cava is normal in size with greater than 50% respiratory variability, suggesting right atrial pressure of 3 mmHg. FINDINGS  Left Ventricle: Left ventricular ejection fraction, by estimation, is 55 to 60%. The left ventricle has normal function. The left ventricle has no regional wall motion abnormalities. The left ventricular internal cavity size was normal in size. There is  mild concentric left ventricular hypertrophy. Left ventricular diastolic parameters are indeterminate. Right Ventricle: The right ventricular size is normal. No increase in right ventricular wall thickness. Right ventricular systolic function is normal. There is moderately elevated pulmonary artery systolic pressure. The tricuspid regurgitant velocity is 3.43 m/s, and with an assumed right atrial pressure of 3 mmHg, the estimated right ventricular systolic pressure is 50.1 mmHg. Left Atrium: Left atrial size was mildly dilated. Right Atrium: Right atrial size was normal in size. Pericardium: There is no evidence of pericardial effusion. Mitral Valve: The mitral valve is normal in structure. Mild to moderate mitral valve regurgitation. No evidence of mitral valve stenosis. Tricuspid Valve: The tricuspid valve is normal in structure. Tricuspid valve regurgitation is moderate . No evidence of tricuspid stenosis. Aortic Valve: The aortic valve is normal in structure. Aortic valve regurgitation is trivial. Aortic valve sclerosis is present, with no  evidence of aortic valve stenosis. Pulmonic Valve: The pulmonic valve was normal in structure. Pulmonic valve regurgitation is not visualized. No evidence of pulmonic stenosis. Aorta: The aortic root is normal in size and structure. Venous: The inferior vena cava is normal in size with greater than 50% respiratory variability, suggesting right atrial pressure of 3 mmHg. IAS/Shunts: No atrial level shunt detected by color flow Doppler. Additional Comments: A device lead is visualized.  LEFT VENTRICLE PLAX 2D LVIDd:  PROGRESS NOTE    Thomas Gray  UJW:[id number] DOB: 12-Sep-1941 DOA: 08/25/2023 PCP: Olive Bass, MD   Brief Narrative:  82 y.o. male, with past medical history of heavy smoking in the past, quit 13 years ago, COPD, not home oxygen, history of CAD status post CABG, carotid artery disease, discoid lupus erythematosus, hypertension, hyperlipidemia and GERD was transferred from [facility] due to cough, shortness of breath with concern for non-STEMI.  Patient also tested positive for COVID.  On initial presentation, he required 8 L oxygen but subsequently required 2 L of oxygen.  In [facility], troponin was 0.66, lactic acid of 3.7, BNP of 2590 and chest x-ray suggestive of vascular congestion.  He was started on heparin drip.  Cardiology was consulted.  Pulmonary was consulted as well.  Assessment & Plan:   Acute respiratory failure with hypoxia COVID-19 infection COPD exacerbation -Initially required 8 L oxygen by nasal cannula.  Currently on 1 L oxygen.  Tested positive for COVID.  Chest x-ray on presentation showed resolved pulmonary edema.  Pulmonary evaluated the patient on 08/25/2023 and signed off. -Continue Solu-Medrol.  Continue nebs. -Wean off as able. -Continue isolation.  No need for Paxlovid and remdesivir at this time.   Non-STEMI in a patient with history of CAD status post CABG Hypertension -Troponin was 0.66 at [facility]. -Troponins peaked to 11,782 since admission -Cardiology following and planning for cardiac catheterization at some point.  Echo showed EF of 55 to 60% with mild to moderate mitral regurgitation and moderate tricuspid regurgitation.  Continue heparin.  Continue aspirin, metoprolol and statin.  Peripheral vascular disease Carotid artery disease -Continue aspirin and statin  CKD stage IIIa -Renal function possibly at baseline.  Monitor.  Hyponatremia -Mild.  Monitor.  Leukocytosis -Improved.  Anemia of chronic disease -Possibly  from chronic kidney disease.  Hemoglobin currently stable.  Monitor intermittently.  Acute on chronic diastolic heart failure -Presented with volume overload with lower extremity edema and vascular congestion on imaging.  Diuretics as per cardiology recommendations.  Received a dose of IV Lasix on 08/25/2023. -Strict input and output.  Daily weights.  Fluid restriction.  2D echo as above.  Diabetes mellitus type 2 with hyperglycemia -A1c 7.3.  Continue CBGs with SSI.  Will start long-acting insulin as patient is still on IV Solu-Medrol.  GERD -Continue Protonix  DVT prophylaxis: Heparin drip Code Status: Full Family Communication: None at bedside Disposition Plan: Status is: Inpatient Remains inpatient appropriate because: Of severity of illness  Consultants: Cardiology/pulmonary  Procedures: 2D echo  Antimicrobials: None   Subjective: Patient seen and examined at bedside.  Denies any fever, vomiting, chest pain.  Still short of breath with minimal exertion.   Objective: Vitals:   08/27/23 2151 08/28/23 0650 08/28/23 0653 08/28/23 0720  BP: (!) 145/53  (!) 158/75 (!) 163/69  Pulse: 86  89 85  Resp:  18  18  Temp:    97.7 F (36.5 C)  TempSrc:    Oral  SpO2:  95%  94%  Weight:      Height:        Intake/Output Summary (Last 24 hours) at 08/28/2023 0822 Last data filed at 08/27/2023 1648 Gross per 24 hour  Intake 196.26 ml  Output 450 ml  Net -253.74 ml   Filed Weights   08/25/23 1402 08/26/23 0550  Weight: 74.2 kg 73.6 kg    Examination:  General: No acute distress.  Currently on 1 L oxygen via nasal cannula.  Looks chronically

## 2023-08-28 NOTE — Progress Notes (Signed)
PHARMACY - ANTICOAGULATION Pharmacy Consult for Heparin Indication: NSTEMI   No Known Allergies  Patient Measurements: Height: 5\' 5"  (165.1 cm) Weight: 73.6 kg (162 lb 4.1 oz) IBW/kg (Calculated) : 61.5 Heparin Dosing Weight: total weight  Vital Signs: Temp: 97.7 F (36.5 C) (10/06 0720) Temp Source: Oral (10/06 0720) BP: 163/69 (10/06 0720) Pulse Rate: 85 (10/06 0720)  Labs: Recent Labs    08/25/23 1539 08/25/23 1547 08/25/23 1851 08/25/23 2225 08/26/23 0340 08/26/23 1105 08/26/23 1300 08/27/23 0318 08/28/23 0249  HGB 8.8*  --   --   --  8.0*  --   --  8.2* 7.7*  HCT 28.3*  --   --   --  25.8*  --   --  27.0* 24.8*  PLT 263  --   --   --  243  --   --  268 268  APTT 142*  --   --   --   --   --   --   --   --   LABPROT 15.4*  --   --   --   --   --   --   --   --   INR 1.2  --   --   --   --   --   --   --   --   HEPARINUNFRC  --    < >  --    < > 0.53  --   --  0.48 0.36  CREATININE 1.65*  --   --   --  1.62*  --   --  1.35* 1.36*  TROPONINIHS 2,532*  --  3,072*  --   --  82,956* 10,836*  --   --    < > = values in this interval not displayed.    Estimated Creatinine Clearance: 35.8 mL/min (A) (by C-G formula based on SCr of 1.36 mg/dL (H)).  Assessment: 82 yo male with presented with NSTEMI. PMH of CAD s/p CABG. No anticoagulation PTA. Pharmacy consulted for heparin management.   Heparin level 0.36 is therapeutic with heparin running at 800 units/hr. Hgb (7.7) and PLTs (268) are stable. Per RN, no report of pauses, issues with the line, or signs of bleeding.   Goal of Therapy:  Heparin level 0.3-0.7 units/ml Monitor platelets by anticoagulation protocol: Yes   Plan:  Continue heparin infusion at 800 units/hr Monitor daily heparin level and CBC Monitor for signs/symptoms of bleeding F/u plan for [facility] on Monday 10/7   Romie Minus, PharmD PGY1 Pharmacy Resident  Please check AMION for all [facility] Pharmacy phone numbers After 10:00 PM, call Main  Pharmacy [phone]

## 2023-08-28 NOTE — Plan of Care (Signed)
Thomas Gray

## 2023-08-28 NOTE — Progress Notes (Signed)
Progress Note  Patient Name: Thomas Gray Date of Encounter: 08/28/2023  Primary Cardiologist: Nanetta Batty, MD   Subjective   Patient seen examined his bedside.   Inpatient Medications    Scheduled Meds:  amLODipine  5 mg Oral Daily   arformoterol  15 mcg Nebulization BID   aspirin EC  81 mg Oral QPM   atorvastatin  40 mg Oral QPM   budesonide (PULMICORT) nebulizer solution  0.25 mg Nebulization BID   insulin aspart  0-15 Units Subcutaneous TID WC   insulin aspart  0-5 Units Subcutaneous QHS   ipratropium-albuterol  3 mL Nebulization BID   metoprolol tartrate  25 mg Oral Q8H   pantoprazole  40 mg Oral Daily   predniSONE  50 mg Oral Daily   Continuous Infusions:  heparin 800 Units/hr (08/27/23 1507)   PRN Meds: acetaminophen **OR** acetaminophen, albuterol, hydrALAZINE   Vital Signs    Vitals:   08/27/23 2151 08/28/23 0650 08/28/23 0653 08/28/23 0720  BP: (!) 145/53  (!) 158/75 (!) 163/69  Pulse: 86  89 85  Resp:  18  18  Temp:    97.7 F (36.5 C)  TempSrc:    Oral  SpO2:  95%  94%  Weight:      Height:        Intake/Output Summary (Last 24 hours) at 08/28/2023 1012 Last data filed at 08/28/2023 0926 Gross per 24 hour  Intake 196.26 ml  Output 450 ml  Net -253.74 ml   Filed Weights   08/25/23 1402 08/26/23 0550  Weight: 74.2 kg 73.6 kg    Telemetry    Sinus rhythm  - Personally Reviewed  ECG    None  - Personally Reviewed  Physical Exam    General: Comfortable Head: Atraumatic, normal size  Eyes: PEERLA, EOMI  Neck: Supple, normal JVD Cardiac: Normal S1, S2; RRR; no murmurs, rubs, or gallops Lungs: Clear to auscultation bilaterally Abd: Soft, nontender, no hepatomegaly  Ext: warm, no edema Musculoskeletal: No deformities, BUE and BLE strength normal and equal Skin: Warm and dry, no rashes   Neuro: Alert and oriented to person, place, time, and situation, CNII-XII grossly intact, no focal deficits  Psych: Normal mood and affect   Labs     Chemistry Recent Labs  Lab 08/25/23 1539 08/26/23 0340 08/27/23 0318 08/28/23 0249  NA 137 135 136 134*  K 4.0 4.0 4.4 4.2  CL 104 101 102 102  CO2 18* 24 25 24   GLUCOSE 258* 189* 178* 227*  BUN 20 28* 31* 32*  CREATININE 1.65* 1.62* 1.35* 1.36*  CALCIUM 8.2* 8.1* 8.0* 7.9*  PROT 6.9  --  6.0* 5.9*  ALBUMIN 3.3*  --  2.7* 2.7*  AST 81*  --  177* 151*  ALT 30  --  43 48*  ALKPHOS 48  --  46 46  BILITOT 0.6  --  0.6 0.6  GFRNONAA 41* 42* 52* 52*  ANIONGAP 15 10 9 8      Hematology Recent Labs  Lab 08/26/23 0340 08/27/23 0318 08/28/23 0249  WBC 13.1* 17.7* 10.9*  RBC 3.12* 3.22* 3.05*  HGB 8.0* 8.2* 7.7*  HCT 25.8* 27.0* 24.8*  MCV 82.7 83.9 81.3  MCH 25.6* 25.5* 25.2*  MCHC 31.0 30.4 31.0  RDW 24.8* 25.6* 25.1*  PLT 243 268 268    Cardiac EnzymesNo results for input(s): "TROPONINI" in the last 168 hours. No results for input(s): "TROPIPOC" in the last 168 hours.   BNPNo results for input(s): "  BNP", "PROBNP" in the last 168 hours.   DDimer No results for input(s): "DDIMER" in the last 168 hours.   Radiology    ECHOCARDIOGRAM COMPLETE  Result Date: 08/26/2023    ECHOCARDIOGRAM REPORT   Patient Name:   Thomas Gray Date of Exam: 08/26/2023 Medical Rec #:  [id number]   Height:       65.0 in Accession #:    [id number]  Weight:       162.3 lb Date of Birth:  08/12/1941   BSA:          1.810 m Patient Age:    82 years    BP:           145/73 mmHg Patient Gender: M           HR:           87 bpm. Exam Location:  Inpatient Procedure: 2D Echo, Cardiac Doppler and Color Doppler Indications:    NSTEMI  History:        Patient has prior history of Echocardiogram examinations, most                 recent 05/26/2020. CAD, Prior CABG, COPD and COVID. Chronic kidney                 disease; Risk Factors:Diabetes.  Sonographer:    Delcie Roch RDCS Referring Phys: [id number] DA[address] S ELGER[place]WY  Sonographer Comments: Image acquisition challenging due to respiratory motion. IMPRESSIONS   1. Left ventricular ejection fraction, by estimation, is 55 to 60%. The left ventricle has normal function. The left ventricle has no regional wall motion abnormalities. There is mild concentric left ventricular hypertrophy. Left ventricular diastolic parameters are indeterminate.  2. Right ventricular systolic function is normal. The right ventricular size is normal. There is moderately elevated pulmonary artery systolic pressure.  3. Left atrial size was mildly dilated.  4. The mitral valve is normal in structure. Mild to moderate mitral valve regurgitation. No evidence of mitral stenosis.  5. Tricuspid valve regurgitation is moderate.  6. The aortic valve is normal in structure. Aortic valve regurgitation is trivial. Aortic valve sclerosis is present, with no evidence of aortic valve stenosis.  7. The inferior vena cava is normal in size with greater than 50% respiratory variability, suggesting right atrial pressure of 3 mmHg. FINDINGS  Left Ventricle: Left ventricular ejection fraction, by estimation, is 55 to 60%. The left ventricle has normal function. The left ventricle has no regional wall motion abnormalities. The left ventricular internal cavity size was normal in size. There is  mild concentric left ventricular hypertrophy. Left ventricular diastolic parameters are indeterminate. Right Ventricle: The right ventricular size is normal. No increase in right ventricular wall thickness. Right ventricular systolic function is normal. There is moderately elevated pulmonary artery systolic pressure. The tricuspid regurgitant velocity is 3.43 m/s, and with an assumed right atrial pressure of 3 mmHg, the estimated right ventricular systolic pressure is 50.1 mmHg. Left Atrium: Left atrial size was mildly dilated. Right Atrium: Right atrial size was normal in size. Pericardium: There is no evidence of pericardial effusion. Mitral Valve: The mitral valve is normal in structure. Mild to moderate mitral valve  regurgitation. No evidence of mitral valve stenosis. Tricuspid Valve: The tricuspid valve is normal in structure. Tricuspid valve regurgitation is moderate . No evidence of tricuspid stenosis. Aortic Valve: The aortic valve is normal in structure. Aortic valve regurgitation is trivial. Aortic valve sclerosis is present, with no evidence of aortic  valve stenosis. Pulmonic Valve: The pulmonic valve was normal in structure. Pulmonic valve regurgitation is not visualized. No evidence of pulmonic stenosis. Aorta: The aortic root is normal in size and structure. Venous: The inferior vena cava is normal in size with greater than 50% respiratory variability, suggesting right atrial pressure of 3 mmHg. IAS/Shunts: No atrial level shunt detected by color flow Doppler. Additional Comments: A device lead is visualized.  LEFT VENTRICLE PLAX 2D LVIDd:         3.80 cm   Diastology LVIDs:         2.30 cm   LV e' medial:    5.55 cm/s LV PW:         1.20 cm   LV E/e' medial:  20.0 LV IVS:        1.20 cm   LV e' lateral:   6.53 cm/s LVOT diam:     1.80 cm   LV E/e' lateral: 17.0 LV SV:         45 LV SV Index:   25 LVOT Area:     2.54 cm  RIGHT VENTRICLE            IVC RV Basal diam:  2.90 cm    IVC diam: 2.00 cm RV S prime:     9.46 cm/s TAPSE (M-mode): 1.4 cm LEFT ATRIUM             Index        RIGHT ATRIUM           Index LA diam:        4.40 cm 2.43 cm/m   RA Area:     14.50 cm LA Vol (A2C):   46.7 ml 25.80 ml/m  RA Volume:   32.90 ml  18.18 ml/m LA Vol (A4C):   67.6 ml 37.35 ml/m LA Biplane Vol: 57.6 ml 31.82 ml/m  AORTIC VALVE LVOT Vmax:   88.30 cm/s LVOT Vmean:  60.600 cm/s LVOT VTI:    0.176 m  AORTA Ao Root diam: 3.10 cm Ao Asc diam:  3.30 cm MITRAL VALVE                TRICUSPID VALVE MV Area (PHT): 4.31 cm     TR Peak grad:   47.1 mmHg MV Decel Time: 176 msec     TR Vmax:        343.00 cm/s MV E velocity: 111.00 cm/s MV A velocity: 57.50 cm/s   SHUNTS MV E/A ratio:  1.93         Systemic VTI:  0.18 m                              Systemic Diam: 1.80 cm Sharyl Panchal DO Electronically signed by Thomasene Ripple DO Signature Date/Time: 08/26/2023/1:38:07 PM    Final     Cardiac Studies   Reviewed echo - 08/26/2019  Patient Profile     82 y.o. male with history of coronary artery disease status post CABG, acute hypoxic respiratory failure, chronic venous insufficiency, hypertension, diabetes mellitus and recent COVID-19 infection.  Assessment & Plan    Coronary artery disease status post CABG with elevated troponin Acute hypoxic respiratory failure Chronic venous insufficiency Acute kidney injury-improving Hypertension Diabetes mellitus   Known coronary artery disease, now in the setting of COVID-19 infection troponin peaked to 11,782.  Agree with medical therapy for now and reconsidering cardiac catheterization later next week.  Continue  heparin drip, aspirin, statin as well as beta-blocker.  Informed Consent   Shared Decision Making/Informed Consent The risks [stroke (1 in 1000), death (1 in 1000), kidney failure [usually temporary] (1 in 500), bleeding (1 in 200), allergic reaction [possibly serious] (1 in 200)], benefits (diagnostic support and management of coronary artery disease) and alternatives of a cardiac catheterization were discussed in detail with Mr. Vance and he is willing to proceed.       Echocardiogram  showed normal EF with no wall motion abnormalities.  Mild to moderate mitral regurgitation with moderate tricuspid regurgitation was noted as well as elevated pulmonary artery systolic pressure.  Thankfully kidney function is improving.  He is hypertensive, will restart his home amlodipine 5 mg daily.  White count seems to be trending upward, will defer to the primary team if need for any further.  Time Spent Directly with Patient:   I have spent a total of 40 with the patient reviewing notes, imaging, EKGs, labs and examining the patient as well as establishing an assessment and plan  that was discussed personally with the patient.  > 50% of time was spent in direct patient care  and reviewing imaging with patient .      For questions or updates, please contact [facility] Please consult [email or web address] for contact info under Cardiology/STEMI.      Signed, Dashia Caldeira, DO  08/28/2023, 10:12 AM

## 2023-08-29 ENCOUNTER — Inpatient Hospital Stay (HOSPITAL_COMMUNITY): Payer: Medicare Other

## 2023-08-29 DIAGNOSIS — N1832 Chronic kidney disease, stage 3b: Secondary | ICD-10-CM

## 2023-08-29 DIAGNOSIS — J441 Chronic obstructive pulmonary disease with (acute) exacerbation: Secondary | ICD-10-CM | POA: Diagnosis not present

## 2023-08-29 DIAGNOSIS — I10 Essential (primary) hypertension: Secondary | ICD-10-CM | POA: Diagnosis not present

## 2023-08-29 DIAGNOSIS — I2489 Other forms of acute ischemic heart disease: Secondary | ICD-10-CM | POA: Clinically undetermined

## 2023-08-29 DIAGNOSIS — U071 COVID-19: Secondary | ICD-10-CM | POA: Diagnosis not present

## 2023-08-29 DIAGNOSIS — R7989 Other specified abnormal findings of blood chemistry: Secondary | ICD-10-CM

## 2023-08-29 DIAGNOSIS — Z951 Presence of aortocoronary bypass graft: Secondary | ICD-10-CM | POA: Diagnosis not present

## 2023-08-29 DIAGNOSIS — I2510 Atherosclerotic heart disease of native coronary artery without angina pectoris: Secondary | ICD-10-CM | POA: Diagnosis not present

## 2023-08-29 LAB — URINALYSIS, ROUTINE W REFLEX MICROSCOPIC
Bacteria, UA: NONE SEEN
Bilirubin Urine: NEGATIVE
Glucose, UA: NEGATIVE mg/dL
Ketones, ur: NEGATIVE mg/dL
Leukocytes,Ua: NEGATIVE
Nitrite: NEGATIVE
Protein, ur: 100 mg/dL — AB
Specific Gravity, Urine: 1.005 (ref 1.005–1.030)
pH: 5 (ref 5.0–8.0)

## 2023-08-29 LAB — MAGNESIUM: Magnesium: 2.2 mg/dL (ref 1.7–2.4)

## 2023-08-29 LAB — GLUCOSE, CAPILLARY
Glucose-Capillary: 156 mg/dL — ABNORMAL HIGH (ref 70–99)
Glucose-Capillary: 126 mg/dL — ABNORMAL HIGH (ref 70–99)
Glucose-Capillary: 217 mg/dL — ABNORMAL HIGH (ref 70–99)
Glucose-Capillary: 208 mg/dL — ABNORMAL HIGH (ref 70–99)

## 2023-08-29 LAB — COMPREHENSIVE METABOLIC PANEL
ALT: 57 U/L — ABNORMAL HIGH (ref 0–44)
AST: 119 U/L — ABNORMAL HIGH (ref 15–41)
Albumin: 2.7 g/dL — ABNORMAL LOW (ref 3.5–5.0)
Alkaline Phosphatase: 49 U/L (ref 38–126)
Anion gap: 5 (ref 5–15)
BUN: 29 mg/dL — ABNORMAL HIGH (ref 8–23)
CO2: 27 mmol/L (ref 22–32)
Calcium: 8.3 mg/dL — ABNORMAL LOW (ref 8.9–10.3)
Chloride: 100 mmol/L (ref 98–111)
Creatinine, Ser: 1.26 mg/dL — ABNORMAL HIGH (ref 0.61–1.24)
GFR, Estimated: 57 mL/min — ABNORMAL LOW (ref >60)
Glucose, Bld: 214 mg/dL — ABNORMAL HIGH (ref 70–99)
Potassium: 4.4 mmol/L (ref 3.5–5.1)
Sodium: 132 mmol/L — ABNORMAL LOW (ref 135–145)
Total Bilirubin: 1.1 mg/dL (ref 0.3–1.2)
Total Protein: 6 g/dL — ABNORMAL LOW (ref 6.5–8.1)

## 2023-08-29 LAB — CBC
HCT: 28.1 % — ABNORMAL LOW (ref 39.0–52.0)
Hemoglobin: 8.7 g/dL — ABNORMAL LOW (ref 13.0–17.0)
MCH: 25.5 pg — ABNORMAL LOW (ref 26.0–34.0)
MCHC: 31 g/dL (ref 30.0–36.0)
MCV: 82.4 fL (ref 80.0–100.0)
Platelets: 272 10*3/uL (ref 150–400)
RBC: 3.41 MIL/uL — ABNORMAL LOW (ref 4.22–5.81)
RDW: 24.9 % — ABNORMAL HIGH (ref 11.5–15.5)
WBC: 13.1 10*3/uL — ABNORMAL HIGH (ref 4.0–10.5)
nRBC: 1.1 % — ABNORMAL HIGH (ref 0.0–0.2)

## 2023-08-29 LAB — HEPARIN LEVEL (UNFRACTIONATED): Heparin Unfractionated: 0.36 [IU]/mL (ref 0.30–0.70)

## 2023-08-29 MED ORDER — ENOXAPARIN SODIUM 40 MG/0.4ML IJ SOSY
40.0000 mg | PREFILLED_SYRINGE | INTRAMUSCULAR | Status: DC
  Administered 2023-08-29 – 2023-09-01 (×4): 40 mg via SUBCUTANEOUS
  Filled 2023-08-29 (×4): qty 0.4

## 2023-08-29 MED ORDER — FUROSEMIDE 10 MG/ML IJ SOLN
40.0000 mg | Freq: Once | INTRAMUSCULAR | Status: AC
  Administered 2023-08-29: 40 mg via INTRAVENOUS
  Filled 2023-08-29: qty 4

## 2023-08-29 NOTE — Progress Notes (Signed)
PROGRESS NOTE    Thomas Gray  NGE:[id number] DOB: [birth year] DOA: 08/25/2023 PCP: Olive Bass, MD   Brief Narrative:  82 y.o. male, with past medical history of heavy smoking in the past, quit 13 years ago, COPD, not home oxygen, history of CAD status post CABG, carotid artery disease, discoid lupus erythematosus, hypertension, hyperlipidemia and GERD was transferred from [facility] due to cough, shortness of breath with concern for non-STEMI.  Patient also tested positive for COVID.  On initial presentation, he required 8 L oxygen but subsequently required 2 L of oxygen.  In [facility], troponin was 0.66, lactic acid of 3.7, BNP of 2590 and chest x-ray suggestive of vascular congestion.  He was started on heparin drip.  Cardiology was consulted.  Pulmonary was consulted as well.  Assessment & Plan:   Acute respiratory failure with hypoxia COVID-19 infection COPD exacerbation -Initially required 8 L oxygen by nasal cannula.  Currently on 1 L oxygen.  Tested positive for COVID.  Chest x-ray on presentation showed resolved pulmonary edema.  Pulmonary evaluated the patient on 08/25/2023 and signed off. -Continue Solu-Medrol.  Continue nebs. -Wean off as able. -Continue isolation.  No need for Paxlovid and remdesivir at this time.   Non-STEMI in a patient with history of CAD status post CABG Hypertension -Troponin was 0.66 at [facility] -Troponins peaked to 11,782 since admission -Cardiology following and planning for cardiac catheterization, possibly today.  Echo showed EF of 55 to 60% with mild to moderate mitral regurgitation and moderate tricuspid regurgitation.  Continue heparin.  Continue aspirin, metoprolol and statin.  Peripheral vascular disease Carotid artery disease -Continue aspirin and statin  CKD stage IIIa -Renal function possibly at baseline.  Monitor.  Hyponatremia -Mild.  Monitor.  Leukocytosis -Mild.  Elevated LFTs -Possibly from COVID.   Monitor.  If LFTs worsen, will need to hold statin and get right upper quadrant ultrasound.  Anemia of chronic disease -Possibly from chronic kidney disease.  Hemoglobin currently stable.  Monitor intermittently.  Acute on chronic diastolic heart failure -Presented with volume overload with lower extremity edema and vascular congestion on imaging.  Received a dose of IV Lasix on 08/25/2023.  Will give a dose of IV Lasix today as patient feels more short of breath.  Await cardiology recommendations. -Strict input and output.  Daily weights.  Fluid restriction.  2D echo as above.  Diabetes mellitus type 2 with hyperglycemia -A1c 7.3.  Continue CBGs with SSI.  Continue long-acting insulin   GERD -Continue Protonix  DVT prophylaxis: Heparin drip Code Status: Full Family Communication: None at bedside Disposition Plan: Status is: Inpatient Remains inpatient appropriate because: Of severity of illness  Consultants: Cardiology/pulmonary  Procedures: 2D echo  Antimicrobials: None   Subjective: Patient seen and examined at bedside.  Does not feel well this morning and complains some some shortness of breath.  No fever, chest pain, vomiting or abdominal pain reported. Objective: Vitals:   08/29/23 0105 08/29/23 0502 08/29/23 0515 08/29/23 0745  BP: (!) 161/61 (!) 162/62  (!) 196/72  Pulse: 83 93  (!) 102  Resp: 18 18  20   Temp: 97.9 F (36.6 C) 98.6 F (37 C)  98.9 F (37.2 C)  TempSrc: Oral Oral  Oral  SpO2: 93% 91%  90%  Weight:   75.1 kg   Height:        Intake/Output Summary (Last 24 hours) at 08/29/2023 0800 Last data filed at 08/29/2023 0748 Gross per 24 hour  Intake 1157 ml  Output 1700 ml  Net -543 ml   Filed Weights   08/25/23 1402 08/26/23 0550 08/29/23 0515  Weight: 74.2 kg 73.6 kg 75.1 kg    Examination:  General: Remains on 1 L oxygen via nasal cannula.  No distress.  Looks chronically ill and deconditioned ENT/neck: no obvious thyromegaly or elevated JVD  noted  respiratory: Decreased breath sounds at bases bilaterally with some crackles  CVS: Mild intermittent tachycardia present; S1-S2 heard abdominal: Soft, nontender, slightly distended; no organomegaly, bowel sounds normally heard  extremities: Trace lower extremity edema present; no cyanosis  CNS: No obvious focal deficits noted.  Awake and oriented  lymph: No obvious lymphadenopathy palpable  skin: No obvious lesions/ecchymosis  psych: Currently not agitated.  Affect is mostly flat.   Musculoskeletal: No obvious joint swelling/tenderness   Data Reviewed: I have personally reviewed following labs and imaging studies  CBC: Recent Labs  Lab 08/25/23 1539 08/26/23 0340 08/27/23 0318 08/28/23 0249 08/29/23 0443  WBC 7.4 13.1* 17.7* 10.9* 13.1*  HGB 8.8* 8.0* 8.2* 7.7* 8.7*  HCT 28.3* 25.8* 27.0* 24.8* 28.1*  MCV 83.2 82.7 83.9 81.3 82.4  PLT 263 243 268 268 272   Basic Metabolic Panel: Recent Labs  Lab 08/25/23 1539 08/26/23 0340 08/27/23 0318 08/28/23 0249 08/29/23 0443  NA 137 135 136 134* 132*  K 4.0 4.0 4.4 4.2 4.4  CL 104 101 102 102 100  CO2 18* 24 25 24 27   GLUCOSE 258* 189* 178* 227* 214*  BUN 20 28* 31* 32* 29*  CREATININE 1.65* 1.62* 1.35* 1.36* 1.26*  CALCIUM 8.2* 8.1* 8.0* 7.9* 8.3*  MG 1.7  --  2.0 2.2 2.2  PHOS 2.7  --   --   --   --    GFR: Estimated Creatinine Clearance: 42 mL/min (A) (by C-G formula based on SCr of 1.26 mg/dL (H)). Liver Function Tests: Recent Labs  Lab 08/25/23 1539 08/27/23 0318 08/28/23 0249 08/29/23 0443  AST 81* 177* 151* 119*  ALT 30 43 48* 57*  ALKPHOS 48 46 46 49  BILITOT 0.6 0.6 0.6 1.1  PROT 6.9 6.0* 5.9* 6.0*  ALBUMIN 3.3* 2.7* 2.7* 2.7*   No results for input(s): "LIPASE", "AMYLASE" in the last 168 hours. No results for input(s): "AMMONIA" in the last 168 hours. Coagulation Profile: Recent Labs  Lab 08/25/23 1539  INR 1.2   Cardiac Enzymes: No results for input(s): "CKTOTAL", "CKMB", "CKMBINDEX",  "TROPONINI" in the last 168 hours. BNP (last 3 results) No results for input(s): "PROBNP" in the last 8760 hours. HbA1C: No results for input(s): "HGBA1C" in the last 72 hours.  CBG: Recent Labs  Lab 08/28/23 0651 08/28/23 1108 08/28/23 1558 08/28/23 2152 08/29/23 0643  GLUCAP 238* 321* 224* 190* 156*   Lipid Profile: No results for input(s): "CHOL", "HDL", "LDLCALC", "TRIG", "CHOLHDL", "LDLDIRECT" in the last 72 hours. Thyroid Function Tests: No results for input(s): "TSH", "T4TOTAL", "FREET4", "T3FREE", "THYROIDAB" in the last 72 hours. Anemia Panel: Recent Labs    08/27/23 0318  FERRITIN 37   Sepsis Labs: Recent Labs  Lab 08/25/23 1539  PROCALCITON 0.37    Recent Results (from the past 240 hour(s))  SARS Coronavirus 2 by RT PCR (hospital order, performed in [facility] hospital lab) *cepheid single result test* Anterior Nasal Swab     Status: Abnormal   Collection Time: 08/25/23  2:13 PM   Specimen: Anterior Nasal Swab  Result Value Ref Range Status   SARS Coronavirus 2 by RT PCR POSITIVE (A) NEGATIVE  Final    Comment: Performed at [facility], [address] [place], [place] [address]         Radiology Studies: No results found.      Scheduled Meds:  amLODipine  5 mg Oral Daily   arformoterol  15 mcg Nebulization BID   aspirin EC  81 mg Oral QPM   atorvastatin  40 mg Oral QPM   budesonide (PULMICORT) nebulizer solution  0.25 mg Nebulization BID   insulin aspart  0-15 Units Subcutaneous TID WC   insulin aspart  0-5 Units Subcutaneous QHS   insulin glargine-yfgn  10 Units Subcutaneous Daily   ipratropium-albuterol  3 mL Nebulization BID   metoprolol tartrate  25 mg Oral Q8H   pantoprazole  40 mg Oral Daily   predniSONE  50 mg Oral Daily   Continuous Infusions:  heparin 800 Units/hr (08/29/23 0140)          Glade Lloyd, MD [facility] 08/29/2023, 8:00 AM

## 2023-08-29 NOTE — Progress Notes (Addendum)
Rounding Note    Patient Name: Thomas Gray Date of Encounter: 08/29/2023  [facility] Cardiologist: Nanetta Batty, MD   Subjective   Complains of dyspnea, no chest pain prior to arrival or today. Noted to have low-grade fever and mild cough but no chest pain.  Has chronic edema from lymphedema and venous stasis.  Inpatient Medications    Scheduled Meds:  amLODipine  5 mg Oral Daily   arformoterol  15 mcg Nebulization BID   aspirin EC  81 mg Oral QPM   atorvastatin  40 mg Oral QPM   budesonide (PULMICORT) nebulizer solution  0.25 mg Nebulization BID   insulin aspart  0-15 Units Subcutaneous TID WC   insulin aspart  0-5 Units Subcutaneous QHS   insulin glargine-yfgn  10 Units Subcutaneous Daily   ipratropium-albuterol  3 mL Nebulization BID   metoprolol tartrate  25 mg Oral Q8H   pantoprazole  40 mg Oral Daily   predniSONE  50 mg Oral Daily   Continuous Infusions:  heparin 800 Units/hr (08/29/23 0140)   PRN Meds: acetaminophen **OR** acetaminophen, albuterol, hydrALAZINE   Vital Signs    Vitals:   08/29/23 0515 08/29/23 0745 08/29/23 0831 08/29/23 0832  BP:  (!) 196/72    Pulse:  (!) 102    Resp:  20    Temp:  98.9 F (37.2 C)    TempSrc:  Oral    SpO2:  90% 96% 96%  Weight: 75.1 kg     Height:        Intake/Output Summary (Last 24 hours) at 08/29/2023 1119 Last data filed at 08/29/2023 0748 Gross per 24 hour  Intake 680 ml  Output 1450 ml  Net -770 ml      08/29/2023    5:15 AM 08/26/2023    5:50 AM 08/25/2023    2:02 PM  Last 3 Weights  Weight (lbs) 165 lb 9.6 oz 162 lb 4.1 oz 163 lb 9.3 oz  Weight (kg) 75.116 kg 73.6 kg 74.2 kg      Telemetry    Sinus tachycardia in the 100-110s - Personally Reviewed  ECG    Sinus tachycardia with HR 112 with ST depression throughout precordial leads with AVR ST elevation   - Personally Reviewed => Heart rate notably increased when he got up to urinate.  Denied any chest pain.  Physical Exam    GEN: No acute distress.  Mild to moderately ill-appearing.  Warm to touch. Neck: No JVD Cardiac: RRR, no murmurs, rubs, or gallops.  Respiratory: tight lung sounds, no crackles; no increased work of breathing.  Did very difficult to hear with COVID stethoscope. GI: Soft, nontender, non-distended  MS: 2+ pitting edema worse on the right.  No deformity. Neuro:  Nonfocal  Psych: Normal affect   Labs    High Sensitivity Troponin:   Recent Labs  Lab 08/25/23 1539 08/25/23 1851 08/26/23 1105 08/26/23 1300  TROPONINIHS 2,532* 3,072* 11,782* 10,836*     Chemistry Recent Labs  Lab 08/27/23 0318 08/28/23 0249 08/29/23 0443  NA 136 134* 132*  K 4.4 4.2 4.4  CL 102 102 100  CO2 25 24 27   GLUCOSE 178* 227* 214*  BUN 31* 32* 29*  CREATININE 1.35* 1.36* 1.26*  CALCIUM 8.0* 7.9* 8.3*  MG 2.0 2.2 2.2  PROT 6.0* 5.9* 6.0*  ALBUMIN 2.7* 2.7* 2.7*  AST 177* 151* 119*  ALT 43 48* 57*  ALKPHOS 46 46 49  BILITOT 0.6 0.6 1.1  GFRNONAA 52*  52* 57*  ANIONGAP 9 8 5     Lipids No results for input(s): "CHOL", "TRIG", "HDL", "LABVLDL", "LDLCALC", "CHOLHDL" in the last 168 hours.  Hematology Recent Labs  Lab 08/27/23 0318 08/28/23 0249 08/29/23 0443  WBC 17.7* 10.9* 13.1*  RBC 3.22* 3.05* 3.41*  HGB 8.2* 7.7* 8.7*  HCT 27.0* 24.8* 28.1*  MCV 83.9 81.3 82.4  MCH 25.5* 25.2* 25.5*  MCHC 30.4 31.0 31.0  RDW 25.6* 25.1* 24.9*  PLT 268 268 272   Thyroid No results for input(s): "TSH", "FREET4" in the last 168 hours.  BNPNo results for input(s): "BNP", "PROBNP" in the last 168 hours.  DDimer No results for input(s): "DDIMER" in the last 168 hours.   Radiology    No results found.  Cardiac Studies   Echo 08/2023: 1. Left ventricular ejection fraction, by estimation, is 55 to 60%. The  left ventricle has normal function. The left ventricle has no regional  wall motion abnormalities. There is mild concentric left ventricular  hypertrophy. Left ventricular diastolic  parameters  are indeterminate.   2. Right ventricular systolic function is normal. The right ventricular  size is normal. There is moderately elevated pulmonary artery systolic  pressure.   3. Left atrial size was mildly dilated.   4. The mitral valve is normal in structure. Mild to moderate mitral valve  regurgitation. No evidence of mitral stenosis.   5. Tricuspid valve regurgitation is moderate.   6. The aortic valve is normal in structure. Aortic valve regurgitation is  trivial. Aortic valve sclerosis is present, with no evidence of aortic  valve stenosis.   7. The inferior vena cava is normal in size with greater than 50%  respiratory variability, suggesting right atrial pressure of 3 mmHg.   Patient Profile     82 y.o. male with history of coronary artery disease status post CABG, acute hypoxic respiratory failure, chronic venous insufficiency, hypertension, diabetes mellitus and recent COVID-19 infection found to have elevated troponin.   Assessment & Plan    Elevated troponin -> cannot exclude demand ischemia versus non-STEMI.  Denies any ongoing chest pain. - hs troponin trended to 11782 before downtrending  - no chest pain prior to admission or throughout admission - [place] with preserved EF but pulmonary hypertension - plan for diagnostic cath with RHC for filling pressure - if complex lesion identified, would plan for staged intervention if needed => since he is relatively stable right now and has received IV Lasix I would prefer to wait until 08/30/2023 for right left heart cath which will allow [place] to determine volume status for further diuresis but also to ensure no significant CAD. - will continue heparin gtt through cath   CAD s/p CABG x 3 2013 - last heart cath 2021 with 3/3 patent grafts - continue ASA -Continue Lopressor 25 mg every 8, may need to titrate up as BP and heart rate will tolerate.  Still remains tachycardic although this could be compensatory.   COVID-19  infection Sinus tachycardia - positive on 08/25/23 - per primary =-> on nebs and prednisone. -> Low-grade fever today.  Unfavorable to proceed with catheterization.   With ongoing COVID symptoms and no chest pain, will defer cath today. Will plan for continued evaluation.    For questions or updates, please contact [facility] Please consult [email or web address] for contact info under        Signed, Marcelino Duster, PA  08/29/2023, 11:19 AM    ATTENDING ATTESTATION  I have seen, examined  and evaluated the patient this morning on rounds along with Bettina Gavia, PA.  After reviewing all the available data and chart, we discussed the patients laboratory, study & physical findings as well as symptoms in detail.  I agree with her findings, examination as well as impression recommendations as per our discussion.    Attending adjustments noted in italics.   Relatively stable, but mild low-grade fever today.  Remains tachycardic.  Question of volume status. Provided his renal function and hemoglobins are stable tomorrow and no active fever, would anticipate right left heart catheterization initially diagnostic and PCI if severe lesion noted.  Trying to avoid excess contrast based on renal insufficiency.    Marykay Lex, MD, MS Bryan Lemma, M.D., M.S. Interventional Cardiologist  [facility]  Pager # [phone] Phone # [phone] [address] [place], [place] [address]   Bryan Lemma, MD

## 2023-08-29 NOTE — Plan of Care (Signed)

## 2023-08-29 NOTE — Plan of Care (Signed)

## 2023-08-30 ENCOUNTER — Inpatient Hospital Stay (HOSPITAL_COMMUNITY)
Admission: AD | Disposition: A | Discharge status: Home / Self Care | Payer: Self-pay | Source: Other Acute Inpatient Hospital | Attending: Internal Medicine

## 2023-08-30 DIAGNOSIS — I214 Non-ST elevation (NSTEMI) myocardial infarction: Secondary | ICD-10-CM | POA: Diagnosis not present

## 2023-08-30 DIAGNOSIS — U071 COVID-19: Secondary | ICD-10-CM | POA: Diagnosis not present

## 2023-08-30 DIAGNOSIS — I2510 Atherosclerotic heart disease of native coronary artery without angina pectoris: Secondary | ICD-10-CM | POA: Diagnosis not present

## 2023-08-30 DIAGNOSIS — I10 Essential (primary) hypertension: Secondary | ICD-10-CM | POA: Diagnosis not present

## 2023-08-30 DIAGNOSIS — R7989 Other specified abnormal findings of blood chemistry: Secondary | ICD-10-CM | POA: Diagnosis not present

## 2023-08-30 HISTORY — PX: RIGHT/LEFT HEART CATH AND CORONARY/GRAFT ANGIOGRAPHY: CATH118267

## 2023-08-30 LAB — POCT I-STAT EG7
Acid-Base Excess: 2 mmol/L (ref 0.0–2.0)
Bicarbonate: 26.7 mmol/L (ref 20.0–28.0)
Calcium, Ion: 1.1 mmol/L — ABNORMAL LOW (ref 1.15–1.40)
HCT: 26 % — ABNORMAL LOW (ref 39.0–52.0)
Hemoglobin: 8.8 g/dL — ABNORMAL LOW (ref 13.0–17.0)
O2 Saturation: 70 %
Potassium: 4.5 mmol/L (ref 3.5–5.1)
Sodium: 135 mmol/L (ref 135–145)
TCO2: 28 mmol/L (ref 22–32)
pCO2, Ven: 39.9 mm[Hg] — ABNORMAL LOW (ref 44–60)
pH, Ven: 7.434 — ABNORMAL HIGH (ref 7.25–7.43)
pO2, Ven: 36 mm[Hg] (ref 32–45)

## 2023-08-30 LAB — POCT I-STAT 7, (LYTES, BLD GAS, ICA,H+H)
Acid-Base Excess: 1 mmol/L (ref 0.0–2.0)
Bicarbonate: 25.1 mmol/L (ref 20.0–28.0)
Calcium, Ion: 1.1 mmol/L — ABNORMAL LOW (ref 1.15–1.40)
HCT: 26 % — ABNORMAL LOW (ref 39.0–52.0)
Hemoglobin: 8.8 g/dL — ABNORMAL LOW (ref 13.0–17.0)
O2 Saturation: 95 %
Potassium: 4.5 mmol/L (ref 3.5–5.1)
Sodium: 135 mmol/L (ref 135–145)
TCO2: 26 mmol/L (ref 22–32)
pCO2 arterial: 36.6 mm[Hg] (ref 32–48)
pH, Arterial: 7.445 (ref 7.35–7.45)
pO2, Arterial: 72 mm[Hg] — ABNORMAL LOW (ref 83–108)

## 2023-08-30 LAB — GLUCOSE, CAPILLARY
Glucose-Capillary: 128 mg/dL — ABNORMAL HIGH (ref 70–99)
Glucose-Capillary: 117 mg/dL — ABNORMAL HIGH (ref 70–99)
Glucose-Capillary: 202 mg/dL — ABNORMAL HIGH (ref 70–99)
Glucose-Capillary: 239 mg/dL — ABNORMAL HIGH (ref 70–99)

## 2023-08-30 LAB — CBC
HCT: 27.2 % — ABNORMAL LOW (ref 39.0–52.0)
Hemoglobin: 8.5 g/dL — ABNORMAL LOW (ref 13.0–17.0)
MCH: 25.2 pg — ABNORMAL LOW (ref 26.0–34.0)
MCHC: 31.3 g/dL (ref 30.0–36.0)
MCV: 80.7 fL (ref 80.0–100.0)
Platelets: 286 10*3/uL (ref 150–400)
RBC: 3.37 MIL/uL — ABNORMAL LOW (ref 4.22–5.81)
RDW: 25.3 % — ABNORMAL HIGH (ref 11.5–15.5)
WBC: 13.3 10*3/uL — ABNORMAL HIGH (ref 4.0–10.5)
nRBC: 0.6 % — ABNORMAL HIGH (ref 0.0–0.2)

## 2023-08-30 LAB — C-REACTIVE PROTEIN: CRP: 10.2 mg/dL — ABNORMAL HIGH (ref <1.0)

## 2023-08-30 LAB — COMPREHENSIVE METABOLIC PANEL
ALT: 67 U/L — ABNORMAL HIGH (ref 0–44)
AST: 106 U/L — ABNORMAL HIGH (ref 15–41)
Albumin: 2.8 g/dL — ABNORMAL LOW (ref 3.5–5.0)
Alkaline Phosphatase: 60 U/L (ref 38–126)
Anion gap: 10 (ref 5–15)
BUN: 34 mg/dL — ABNORMAL HIGH (ref 8–23)
CO2: 26 mmol/L (ref 22–32)
Calcium: 8.4 mg/dL — ABNORMAL LOW (ref 8.9–10.3)
Chloride: 98 mmol/L (ref 98–111)
Creatinine, Ser: 1.47 mg/dL — ABNORMAL HIGH (ref 0.61–1.24)
GFR, Estimated: 47 mL/min — ABNORMAL LOW (ref >60)
Glucose, Bld: 177 mg/dL — ABNORMAL HIGH (ref 70–99)
Potassium: 4 mmol/L (ref 3.5–5.1)
Sodium: 134 mmol/L — ABNORMAL LOW (ref 135–145)
Total Bilirubin: 0.9 mg/dL (ref 0.3–1.2)
Total Protein: 6.3 g/dL — ABNORMAL LOW (ref 6.5–8.1)

## 2023-08-30 LAB — URINE CULTURE: Culture: NO GROWTH

## 2023-08-30 LAB — MAGNESIUM: Magnesium: 2.2 mg/dL (ref 1.7–2.4)

## 2023-08-30 SURGERY — RIGHT/LEFT HEART CATH AND CORONARY/GRAFT ANGIOGRAPHY
Anesthesia: LOCAL

## 2023-08-30 MED ORDER — IOHEXOL 350 MG/ML SOLN
INTRAVENOUS | Status: DC | PRN
  Administered 2023-08-30: 75 mL

## 2023-08-30 MED ORDER — HEPARIN (PORCINE) IN NACL 1000-0.9 UT/500ML-% IV SOLN
INTRAVENOUS | Status: DC | PRN
  Administered 2023-08-30 (×2): 500 mL

## 2023-08-30 MED ORDER — SODIUM CHLORIDE 0.9 % IV SOLN: INTRAVENOUS | Status: AC

## 2023-08-30 MED ORDER — MIDAZOLAM HCL 2 MG/2ML IJ SOLN
INTRAMUSCULAR | Status: AC
  Filled 2023-08-30: qty 2

## 2023-08-30 MED ORDER — ONDANSETRON HCL 4 MG/2ML IJ SOLN: 4.0000 mg | Freq: Four times a day (QID) | INTRAMUSCULAR | Status: DC | PRN

## 2023-08-30 MED ORDER — HYDRALAZINE HCL 20 MG/ML IJ SOLN: 10.0000 mg | INTRAMUSCULAR | Status: AC | PRN

## 2023-08-30 MED ORDER — HEPARIN SODIUM (PORCINE) 1000 UNIT/ML IJ SOLN
INTRAMUSCULAR | Status: AC
  Filled 2023-08-30: qty 10

## 2023-08-30 MED ORDER — SODIUM CHLORIDE 0.9 % WEIGHT BASED INFUSION
1.0000 mL/kg/h | INTRAVENOUS | Status: DC
  Administered 2023-08-30: 1 mL/kg/h via INTRAVENOUS

## 2023-08-30 MED ORDER — ASPIRIN 81 MG PO CHEW
81.0000 mg | CHEWABLE_TABLET | ORAL | Status: AC
  Administered 2023-08-30: 81 mg via ORAL
  Filled 2023-08-30: qty 1

## 2023-08-30 MED ORDER — LIDOCAINE HCL (PF) 1 % IJ SOLN
INTRAMUSCULAR | Status: AC
  Filled 2023-08-30: qty 30

## 2023-08-30 MED ORDER — SODIUM CHLORIDE 0.9 % IV SOLN: 250.0000 mL | INTRAVENOUS | Status: DC | PRN

## 2023-08-30 MED ORDER — LIDOCAINE HCL (PF) 1 % IJ SOLN
INTRAMUSCULAR | Status: DC | PRN
  Administered 2023-08-30: 10 mL
  Administered 2023-08-30: 2 mL
  Administered 2023-08-30: 15 mL
  Administered 2023-08-30: 2 mL

## 2023-08-30 MED ORDER — VERAPAMIL HCL 2.5 MG/ML IV SOLN
INTRAVENOUS | Status: AC
  Filled 2023-08-30: qty 2

## 2023-08-30 MED ORDER — LABETALOL HCL 5 MG/ML IV SOLN: 10.0000 mg | INTRAVENOUS | Status: AC | PRN

## 2023-08-30 MED ORDER — FENTANYL CITRATE (PF) 100 MCG/2ML IJ SOLN
INTRAMUSCULAR | Status: AC
  Filled 2023-08-30: qty 2

## 2023-08-30 MED ORDER — SODIUM CHLORIDE 0.9% FLUSH: 3.0000 mL | INTRAVENOUS | Status: DC | PRN

## 2023-08-30 MED ORDER — SODIUM CHLORIDE 0.9% FLUSH
3.0000 mL | Freq: Two times a day (BID) | INTRAVENOUS | Status: DC
  Administered 2023-08-30 – 2023-09-01 (×5): 3 mL via INTRAVENOUS

## 2023-08-30 MED ORDER — SODIUM CHLORIDE 0.9% FLUSH
3.0000 mL | Freq: Two times a day (BID) | INTRAVENOUS | Status: DC
  Administered 2023-08-31 – 2023-09-01 (×3): 3 mL via INTRAVENOUS

## 2023-08-30 MED ORDER — ACETAMINOPHEN 325 MG PO TABS: 650.0000 mg | ORAL_TABLET | ORAL | Status: DC | PRN

## 2023-08-30 MED ORDER — SODIUM CHLORIDE 0.9 % WEIGHT BASED INFUSION: 3.0000 mL/kg/h | INTRAVENOUS | Status: DC

## 2023-08-30 MED ORDER — FUROSEMIDE 10 MG/ML IJ SOLN
40.0000 mg | Freq: Once | INTRAMUSCULAR | Status: AC
  Administered 2023-08-30: 40 mg via INTRAVENOUS
  Filled 2023-08-30: qty 4

## 2023-08-30 SURGICAL SUPPLY — 21 items
CATH BALLN WEDGE 5F 110CM (CATHETERS) IMPLANT
CATH INFINITI 5 FR AR1 MOD (CATHETERS) IMPLANT
CATH INFINITI 5 FR IM (CATHETERS) IMPLANT
CATH INFINITI 5 FR LCB (CATHETERS) IMPLANT
CATH INFINITI 5FR MULTPACK ANG (CATHETERS) IMPLANT
CATH SWAN GANZ 7F STRAIGHT (CATHETERS) IMPLANT
CLOSURE MYNX CONTROL 5F (Vascular Products) IMPLANT
ELECT DEFIB PAD ADLT CADENCE (PAD) IMPLANT
GLIDESHEATH SLEND A-KIT 6F 22G (SHEATH) IMPLANT
GUIDEWIRE .025 260CM (WIRE) IMPLANT
GUIDEWIRE INQWIRE 1.5J.035X260 (WIRE) IMPLANT
INQWIRE 1.5J .035X260CM (WIRE) ×1
KIT MICROPUNCTURE NIT STIFF (SHEATH) IMPLANT
KIT SYRINGE INJ CVI SPIKEX1 (MISCELLANEOUS) IMPLANT
PACK CARDIAC CATHETERIZATION (CUSTOM PROCEDURE TRAY) ×1 IMPLANT
SET ATX-X65L (MISCELLANEOUS) IMPLANT
SHEATH GLIDE SLENDER 4/5FR (SHEATH) IMPLANT
SHEATH PINNACLE 5F 10CM (SHEATH) IMPLANT
SHEATH PINNACLE 7F 10CM (SHEATH) IMPLANT
SHEATH PROBE COVER 6X72 (BAG) IMPLANT
WIRE EMERALD 3MM-J .035X150CM (WIRE) IMPLANT

## 2023-08-30 NOTE — Interval H&P Note (Signed)
History and Physical Interval Note:  08/30/2023 2:33 PM  Thomas Gray  has presented today for surgery, with the diagnosis of NSTEMI.  The various methods of treatment have been discussed with the patient and family. After consideration of risks, benefits and other options for treatment, the patient has consented to  Procedure(s): RIGHT/LEFT HEART CATH AND CORONARY/GRAFT ANGIOGRAPHY (N/A) as a surgical intervention.  The patient's history has been reviewed, patient examined, no change in status, stable for surgery.  I have reviewed the patient's chart and labs.  Questions were answered to the patient's satisfaction.     Joanna Hall J Matias Thurman

## 2023-08-30 NOTE — Progress Notes (Signed)
Patient informed of order for bedrest for 2 hours- until 1844. VS obtained. Patient connected back to telemetry. No hematoma noted in R femoral site- soft to touch. Gauze CDI.

## 2023-08-30 NOTE — Progress Notes (Signed)
PROGRESS NOTE    Thomas Gray  AOZ:[id number] DOB: [birth year] DOA: 08/25/2023 PCP: Olive Bass, MD   Brief Narrative:  82 y.o. male, with past medical history of heavy smoking in the past, quit 13 years ago, COPD, not home oxygen, history of CAD status post CABG, carotid artery disease, discoid lupus erythematosus, hypertension, hyperlipidemia and GERD was transferred from [facility] due to cough, shortness of breath with concern for non-STEMI.  Patient also tested positive for COVID.  On initial presentation, he required 8 L oxygen but subsequently required 2 L of oxygen.  In [facility], troponin was 0.66, lactic acid of 3.7, BNP of 2590 and chest x-ray suggestive of vascular congestion.  He was started on heparin drip.  Cardiology was consulted.  Pulmonary was consulted as well: Pulmonary consulted signed off.  Cardiology planning for cardiac cath possibly today.  Assessment & Plan:   Acute respiratory failure with hypoxia COVID-19 infection COPD exacerbation -Initially required 8 L oxygen by nasal cannula.  Currently on 2 L oxygen.  Tested positive for COVID.  Chest x-ray on presentation showed resolved pulmonary edema.  Pulmonary evaluated the patient on 08/25/2023 and signed off. -Continue Solu-Medrol.  Continue nebs. -Wean off as able. -Continue isolation.  No need for Paxlovid and remdesivir at this time.  -Tmax of 102.8 over the last 24 hours.  Non-STEMI in a patient with history of CAD status post CABG Hypertension -Troponin was 0.66 at [facility]. -Troponins peaked to 11,782 since admission -Cardiology following and planning for cardiac catheterization, possibly today.  Echo showed EF of 55 to 60% with mild to moderate mitral regurgitation and moderate tricuspid regurgitation.  Continue aspirin, metoprolol and statin.  Peripheral vascular disease Carotid artery disease -Continue aspirin and statin  CKD stage IIIa -Creatinine slightly trended upwards  today at 1.47.  Monitor.  Hyponatremia -Mild.  Monitor.  Leukocytosis -Mild.  Elevated LFTs -Possibly from COVID.  Only mildly elevated.  Monitor.  If LFTs worsen, will need to hold statin and get right upper quadrant ultrasound.  Anemia of chronic disease -Possibly from chronic kidney disease.  Hemoglobin currently stable.  Monitor intermittently.  Acute on chronic diastolic heart failure -Presented with volume overload with lower extremity edema and vascular congestion on imaging.  Received a dose of IV Lasix on 08/25/2023.  Received another dose of IV Lasix on 08/29/2023 because of worsening shortness of breath and chest x-ray had shown increasing bilateral lung opacities.  Further diuresis as per cardiology. -Strict input and output.  Daily weights.  Fluid restriction.  2D echo as above.  Diabetes mellitus type 2 with hyperglycemia -A1c 7.3.  Continue CBGs with SSI.  Continue long-acting insulin   GERD -Continue Protonix  DVT prophylaxis: Lovenox  code Status: Full Family Communication: None at bedside Disposition Plan: Status is: Inpatient Remains inpatient appropriate because: Of severity of illness  Consultants: Cardiology/pulmonary  Procedures: 2D echo  Antimicrobials: None   Subjective: Patient seen and examined at bedside.  Had fever yesterday morning with increasing shortness of breath.  No chest pain, vomiting, worsening abdominal pain reported.   Objective: Vitals:   08/30/23 0415 08/30/23 0550 08/30/23 0554 08/30/23 0603  BP: (!) 150/66     Pulse: 85 (!) 113  (!) 104  Resp: 20     Temp: 98.6 F (37 C)     TempSrc: Oral     SpO2: 99%     Weight:   73.5 kg   Height:  Intake/Output Summary (Last 24 hours) at 08/30/2023 0803 Last data filed at 08/30/2023 0658 Gross per 24 hour  Intake 510.12 ml  Output 1625 ml  Net -1114.88 ml   Filed Weights   08/26/23 0550 08/29/23 0515 08/30/23 0554  Weight: 73.6 kg 75.1 kg 73.5 kg     Examination:  General: No acute distress.  On 2 L oxygen via nasal cannula.  Looks chronically ill and deconditioned ENT/neck: No JVD elevation or palpable neck masses noted  respiratory: Bilateral decreased breath sounds at bases with scattered crackles CVS: S1-S2 heard; tachycardic  abdominal: Soft, nontender, still slightly distended; no organomegaly, normal bowel sounds heard  extremities: No clubbing; mild lower extremity edema present  CNS: Alert and awake.  No focal deficits noted lymph: No cervical lymphadenopathy skin: No obvious petechiae/rashes psych: Flat affect.  Showing no signs of agitation Musculoskeletal: No obvious joint erythema/deformity   Data Reviewed: I have personally reviewed following labs and imaging studies  CBC: Recent Labs  Lab 08/26/23 0340 08/27/23 0318 08/28/23 0249 08/29/23 0443 08/30/23 0323  WBC 13.1* 17.7* 10.9* 13.1* 13.3*  HGB 8.0* 8.2* 7.7* 8.7* 8.5*  HCT 25.8* 27.0* 24.8* 28.1* 27.2*  MCV 82.7 83.9 81.3 82.4 80.7  PLT 243 268 268 272 286   Basic Metabolic Panel: Recent Labs  Lab 08/25/23 1539 08/26/23 0340 08/27/23 0318 08/28/23 0249 08/29/23 0443 08/30/23 0323  NA 137 135 136 134* 132* 134*  K 4.0 4.0 4.4 4.2 4.4 4.0  CL 104 101 102 102 100 98  CO2 18* 24 25 24 27 26   GLUCOSE 258* 189* 178* 227* 214* 177*  BUN 20 28* 31* 32* 29* 34*  CREATININE 1.65* 1.62* 1.35* 1.36* 1.26* 1.47*  CALCIUM 8.2* 8.1* 8.0* 7.9* 8.3* 8.4*  MG 1.7  --  2.0 2.2 2.2 2.2  PHOS 2.7  --   --   --   --   --    GFR: Estimated Creatinine Clearance: 33.1 mL/min (A) (by C-G formula based on SCr of 1.47 mg/dL (H)). Liver Function Tests: Recent Labs  Lab 08/25/23 1539 08/27/23 0318 08/28/23 0249 08/29/23 0443 08/30/23 0323  AST 81* 177* 151* 119* 106*  ALT 30 43 48* 57* 67*  ALKPHOS 48 46 46 49 60  BILITOT 0.6 0.6 0.6 1.1 0.9  PROT 6.9 6.0* 5.9* 6.0* 6.3*  ALBUMIN 3.3* 2.7* 2.7* 2.7* 2.8*   No results for input(s): "LIPASE", "AMYLASE"  in the last 168 hours. No results for input(s): "AMMONIA" in the last 168 hours. Coagulation Profile: Recent Labs  Lab 08/25/23 1539  INR 1.2   Cardiac Enzymes: No results for input(s): "CKTOTAL", "CKMB", "CKMBINDEX", "TROPONINI" in the last 168 hours. BNP (last 3 results) No results for input(s): "PROBNP" in the last 8760 hours. HbA1C: No results for input(s): "HGBA1C" in the last 72 hours.  CBG: Recent Labs  Lab 08/29/23 0643 08/29/23 1143 08/29/23 1559 08/29/23 2111 08/30/23 0549  GLUCAP 156* 126* 217* 208* 128*   Lipid Profile: No results for input(s): "CHOL", "HDL", "LDLCALC", "TRIG", "CHOLHDL", "LDLDIRECT" in the last 72 hours. Thyroid Function Tests: No results for input(s): "TSH", "T4TOTAL", "FREET4", "T3FREE", "THYROIDAB" in the last 72 hours. Anemia Panel: No results for input(s): "VITAMINB12", "FOLATE", "FERRITIN", "TIBC", "IRON", "RETICCTPCT" in the last 72 hours.  Sepsis Labs: Recent Labs  Lab 08/25/23 1539  PROCALCITON 0.37    Recent Results (from the past 240 hour(s))  SARS Coronavirus 2 by RT PCR (hospital order, performed in [facility] hospital lab) *cepheid single result  test* Anterior Nasal Swab     Status: Abnormal   Collection Time: 08/25/23  2:13 PM   Specimen: Anterior Nasal Swab  Result Value Ref Range Status   SARS Coronavirus 2 by RT PCR POSITIVE (A) NEGATIVE Final    Comment: Performed at [facility], [address] [place], [place] [address]  Culture, blood (Routine X 2) w Reflex to ID Panel     Status: None (Preliminary result)   Collection Time: 08/29/23  3:15 PM   Specimen: BLOOD LEFT HAND  Result Value Ref Range Status   Specimen Description BLOOD LEFT HAND  Final   Special Requests   Final    BOTTLES DRAWN AEROBIC AND ANAEROBIC Blood Culture adequate volume Performed at [facility], [address], [place], [place] [address]    Culture PENDING  Incomplete   Report Status PENDING  Incomplete          Radiology Studies: DG CHEST PORT 1 VIEW  Result Date: 08/29/2023 CLINICAL DATA:  Dyspnea. EXAM: PORTABLE CHEST 1 VIEW COMPARISON:  August 25, 2023. FINDINGS: Stable cardiomediastinal silhouette. Status post coronary artery bypass graft. Significantly increased bilateral lung opacities are noted concerning for worsening pneumonia or edema. Bony thorax is unremarkable. IMPRESSION: Significant increased bilateral lung opacities are noted concerning for worsening pneumonia or edema. Electronically Signed   By: Lupita Raider M.D.   On: 08/29/2023 13:40        Scheduled Meds:  amLODipine  5 mg Oral Daily   arformoterol  15 mcg Nebulization BID   aspirin EC  81 mg Oral QPM   atorvastatin  40 mg Oral QPM   budesonide (PULMICORT) nebulizer solution  0.25 mg Nebulization BID   enoxaparin (LOVENOX) injection  40 mg Subcutaneous Q24H   insulin aspart  0-15 Units Subcutaneous TID WC   insulin aspart  0-5 Units Subcutaneous QHS   insulin glargine-yfgn  10 Units Subcutaneous Daily   ipratropium-albuterol  3 mL Nebulization BID   metoprolol tartrate  25 mg Oral Q8H   pantoprazole  40 mg Oral Daily   predniSONE  50 mg Oral Daily   Continuous Infusions:  sodium chloride 1 mL/kg/hr (08/30/23 0546)          Glade Lloyd, MD [facility] 08/30/2023, 8:03 AM

## 2023-08-30 NOTE — Plan of Care (Signed)
  Problem: Education: Goal: Knowledge of General Education information will improve Description: Including pain rating scale, medication(s)/side effects and non-pharmacologic comfort measures Outcome: Progressing   Problem: Clinical Measurements: Goal: Ability to maintain clinical measurements within normal limits will improve Outcome: Not Progressing Goal: Diagnostic test results will improve Outcome: Not Progressing

## 2023-08-30 NOTE — Plan of Care (Signed)
  Problem: Education: Goal: Knowledge of General Education information will improve Description: Including pain rating scale, medication(s)/side effects and non-pharmacologic comfort measures Outcome: Progressing   Problem: Health Behavior/Discharge Planning: Goal: Ability to manage health-related needs will improve Outcome: Progressing   Problem: Clinical Measurements: Goal: Ability to maintain clinical measurements within normal limits will improve Outcome: Progressing Goal: Will remain free from infection Outcome: Progressing Goal: Diagnostic test results will improve Outcome: Progressing Goal: Respiratory complications will improve Outcome: Progressing Goal: Cardiovascular complication will be avoided Outcome: Progressing   Problem: Activity: Goal: Risk for activity intolerance will decrease Outcome: Progressing   Problem: Nutrition: Goal: Adequate nutrition will be maintained Outcome: Progressing   Problem: Coping: Goal: Level of anxiety will decrease Outcome: Progressing   Problem: Elimination: Goal: Will not experience complications related to bowel motility Outcome: Progressing Goal: Will not experience complications related to urinary retention Outcome: Progressing   Problem: Pain Managment: Goal: General experience of comfort will improve Outcome: Progressing   Problem: Safety: Goal: Ability to remain free from injury will improve Outcome: Progressing   Problem: Skin Integrity: Goal: Risk for impaired skin integrity will decrease Outcome: Progressing   Problem: Education: Goal: Knowledge of risk factors and measures for prevention of condition will improve Outcome: Progressing   Problem: Coping: Goal: Psychosocial and spiritual needs will be supported Outcome: Progressing   Problem: Respiratory: Goal: Will maintain a patent airway Outcome: Progressing Goal: Complications related to the disease process, condition or treatment will be avoided or  minimized Outcome: Progressing   Problem: Education: Goal: Ability to describe self-care measures that may prevent or decrease complications (Diabetes Survival Skills Education) will improve Outcome: Progressing Goal: Individualized Educational Video(s) Outcome: Progressing   Problem: Coping: Goal: Ability to adjust to condition or change in health will improve Outcome: Progressing   Problem: Fluid Volume: Goal: Ability to maintain a balanced intake and output will improve Outcome: Progressing   Problem: Health Behavior/Discharge Planning: Goal: Ability to identify and utilize available resources and services will improve Outcome: Progressing Goal: Ability to manage health-related needs will improve Outcome: Progressing   Problem: Metabolic: Goal: Ability to maintain appropriate glucose levels will improve Outcome: Progressing   Problem: Nutritional: Goal: Maintenance of adequate nutrition will improve Outcome: Progressing Goal: Progress toward achieving an optimal weight will improve Outcome: Progressing   Problem: Skin Integrity: Goal: Risk for impaired skin integrity will decrease Outcome: Progressing   Problem: Tissue Perfusion: Goal: Adequacy of tissue perfusion will improve Outcome: Progressing   Problem: Education: Goal: Understanding of CV disease, CV risk reduction, and recovery process will improve Outcome: Progressing Goal: Individualized Educational Video(s) Outcome: Progressing   Problem: Activity: Goal: Ability to return to baseline activity level will improve Outcome: Progressing   Problem: Cardiovascular: Goal: Ability to achieve and maintain adequate cardiovascular perfusion will improve Outcome: Progressing Goal: Vascular access site(s) Level 0-1 will be maintained Outcome: Progressing   Problem: Health Behavior/Discharge Planning: Goal: Ability to safely manage health-related needs after discharge will improve Outcome: Progressing

## 2023-08-30 NOTE — H&P (View-Only) (Signed)
Rounding Note    Patient Name: Thomas Gray Date of Encounter: 08/30/2023  [facility] Cardiologist: Nanetta Batty, MD   Subjective   Pt tachycardic today with increasing O2 requirement.  Noted that he was SOB & HR went up with tranferring or pulling himself up => SOB, but NO CP  Inpatient Medications    Scheduled Meds:  amLODipine  5 mg Oral Daily   arformoterol  15 mcg Nebulization BID   aspirin EC  81 mg Oral QPM   atorvastatin  40 mg Oral QPM   budesonide (PULMICORT) nebulizer solution  0.25 mg Nebulization BID   enoxaparin (LOVENOX) injection  40 mg Subcutaneous Q24H   insulin aspart  0-15 Units Subcutaneous TID WC   insulin aspart  0-5 Units Subcutaneous QHS   insulin glargine-yfgn  10 Units Subcutaneous Daily   ipratropium-albuterol  3 mL Nebulization BID   metoprolol tartrate  25 mg Oral Q8H   pantoprazole  40 mg Oral Daily   predniSONE  50 mg Oral Daily   Continuous Infusions:  sodium chloride 1 mL/kg/hr (08/30/23 0546)   PRN Meds: acetaminophen **OR** acetaminophen, albuterol, hydrALAZINE   Vital Signs    Vitals:   08/30/23 0415 08/30/23 0550 08/30/23 0554 08/30/23 0603  BP: (!) 150/66     Pulse: 85 (!) 113  (!) 104  Resp: 20     Temp: 98.6 F (37 C)     TempSrc: Oral     SpO2: 99%     Weight:   73.5 kg   Height:        Intake/Output Summary (Last 24 hours) at 08/30/2023 1039 Last data filed at 08/30/2023 0858 Gross per 24 hour  Intake 390.12 ml  Output 1925 ml  Net -1534.88 ml      08/30/2023    5:54 AM 08/29/2023    5:15 AM 08/26/2023    5:50 AM  Last 3 Weights  Weight (lbs) 162 lb 0.6 oz 165 lb 9.6 oz 162 lb 4.1 oz  Weight (kg) 73.5 kg 75.116 kg 73.6 kg      Telemetry    Sinus to sinus tachycardia with HR 90S-120s - Personally Reviewed -On his monitor, heart rate increases with activity.  ECG    No new tracings - Personally Reviewed  Physical Exam   GEN: No acute distress.  Actually lying down in bed, seems to be  doing relatively well as long as he is sitting still.  With movements his heart rate goes up. Neck: No JVD Cardiac: RRR, no murmurs, rubs, or gallops.  Respiratory: Mild bibasilar rales GI: Soft, nontender, non-distended  MS: No edema; No deformity. Neuro:  Nonfocal  Psych: Normal affect   Labs    High Sensitivity Troponin:   Recent Labs  Lab 08/25/23 1539 08/25/23 1851 08/26/23 1105 08/26/23 1300  TROPONINIHS 2,532* 3,072* 11,782* 10,836*     Chemistry Recent Labs  Lab 08/28/23 0249 08/29/23 0443 08/30/23 0323  NA 134* 132* 134*  K 4.2 4.4 4.0  CL 102 100 98  CO2 24 27 26   GLUCOSE 227* 214* 177*  BUN 32* 29* 34*  CREATININE 1.36* 1.26* 1.47*  CALCIUM 7.9* 8.3* 8.4*  MG 2.2 2.2 2.2  PROT 5.9* 6.0* 6.3*  ALBUMIN 2.7* 2.7* 2.8*  AST 151* 119* 106*  ALT 48* 57* 67*  ALKPHOS 46 49 60  BILITOT 0.6 1.1 0.9  GFRNONAA 52* 57* 47*  ANIONGAP 8 5 10     Lipids No results for input(s): "CHOL", "  TRIG", "HDL", "LABVLDL", "LDLCALC", "CHOLHDL" in the last 168 hours.  Hematology Recent Labs  Lab 08/28/23 0249 08/29/23 0443 08/30/23 0323  WBC 10.9* 13.1* 13.3*  RBC 3.05* 3.41* 3.37*  HGB 7.7* 8.7* 8.5*  HCT 24.8* 28.1* 27.2*  MCV 81.3 82.4 80.7  MCH 25.2* 25.5* 25.2*  MCHC 31.0 31.0 31.3  RDW 25.1* 24.9* 25.3*  PLT 268 272 286   Thyroid No results for input(s): "TSH", "FREET4" in the last 168 hours.  BNPNo results for input(s): "BNP", "PROBNP" in the last 168 hours.  DDimer No results for input(s): "DDIMER" in the last 168 hours.   Radiology    DG CHEST PORT 1 VIEW  Result Date: 08/29/2023 CLINICAL DATA:  Dyspnea. EXAM: PORTABLE CHEST 1 VIEW COMPARISON:  August 25, 2023. FINDINGS: Stable cardiomediastinal silhouette. Status post coronary artery bypass graft. Significantly increased bilateral lung opacities are noted concerning for worsening pneumonia or edema. Bony thorax is unremarkable. IMPRESSION: Significant increased bilateral lung opacities are noted concerning  for worsening pneumonia or edema. Electronically Signed   By: Lupita Raider M.D.   On: 08/29/2023 13:40    Cardiac Studies   Echo 08/2023: Preserved EF of 55 to 60%.  No RWMA.  Mild LVH.  Indeterminate diastolic parameters.  Normal RV size and function with moderately elevated PAP, but normal RAP.  Mild LA dilation.  Mild to moderate MR.  Moderate TR.  Trivial AI.  Aortic sclerosis but no stenosis.   Patient Profile     82 y.o. male with history of coronary artery disease status post CABG, acute hypoxic respiratory failure, chronic venous insufficiency, hypertension, diabetes mellitus and recent COVID-19 infection found to have elevated troponin.   Assessment & Plan    Elevated troponin  Demand ischemia vs NSTEMI CAD s/p CABG x 3 2013- last heart cath 2021 with 3/3 patent grafts - no chest pain still. - echo with preserved EF but pulmonary hypertension - planning for diagnostic LHC and RHC  - timing to be determined => would like to plan for today based on the fact that we are still trying to determine need for diuresis or not. Had IV fluids ordered for cath, I think that they have put him a little bit volume overloaded.  Will give him Lasix but continue KVO fluids for now pending cath.  Further plans for management of volume status based on cath results. - continue ASA, lipitor, BB   Sinus tachycardia - SR overnight, but ST in the 120s this morning - question if this is compensatory in the setting of COVID - also with increasing O2 requirement - on 25 mg lopressor q8hr - with ST and increasing O2, consider PE study => pending results of right left heart cath   Hypertension - 5 mg amlodipine   DM - A1c 7.3%   Chronic lower extremity edema Venous stasis  Lymphedema - at baseline   COVID-19 -Afebrile today.  However, with progressive dyspnea, may need to consider the possibility of PE.       For questions or updates, please contact [facility] Please  consult [email or web address] for contact info under        Signed, Marcelino Duster, PA  08/30/2023, 10:39 AM      ATTENDING ATTESTATION  I have seen, examined and evaluated the patient this morning on rounds along with Micah Flesher.  After reviewing all the available data and chart, we discussed the patients laboratory, study & physical findings as well as symptoms in detail.  I agree with her findings, examination as well as impression recommendations as per our discussion.    Attending adjustments noted in italics.    Plan for today will be to try to get a right left heart catheterization if not today we will do tomorrow morning to just get an assessment of his coronaries and his filling pressures.  There is concern about pulmonary hypertension which could be 1 explanation for dyspnea.  Also need to exclude true ACS versus demand ischemia.  This is all in conjunction with treating ongoing COVID infection and dyspnea symptoms.   Marykay Lex, MD, MS Bryan Lemma, M.D., M.S. Interventional Cardiologist  [facility]  Pager # [phone] Phone # [phone] [address] [place], [place] [address]

## 2023-08-30 NOTE — Progress Notes (Signed)
Rounding Note    Patient Name: Thomas Gray Date of Encounter: 08/30/2023  [facility] Cardiologist: Nanetta Batty, MD   Subjective   Pt tachycardic today with increasing O2 requirement.  Noted that he was SOB & HR went up with tranferring or pulling himself up => SOB, but NO CP  Inpatient Medications    Scheduled Meds:  amLODipine  5 mg Oral Daily   arformoterol  15 mcg Nebulization BID   aspirin EC  81 mg Oral QPM   atorvastatin  40 mg Oral QPM   budesonide (PULMICORT) nebulizer solution  0.25 mg Nebulization BID   enoxaparin (LOVENOX) injection  40 mg Subcutaneous Q24H   insulin aspart  0-15 Units Subcutaneous TID WC   insulin aspart  0-5 Units Subcutaneous QHS   insulin glargine-yfgn  10 Units Subcutaneous Daily   ipratropium-albuterol  3 mL Nebulization BID   metoprolol tartrate  25 mg Oral Q8H   pantoprazole  40 mg Oral Daily   predniSONE  50 mg Oral Daily   Continuous Infusions:  sodium chloride 1 mL/kg/hr (08/30/23 0546)   PRN Meds: acetaminophen **OR** acetaminophen, albuterol, hydrALAZINE   Vital Signs    Vitals:   08/30/23 0415 08/30/23 0550 08/30/23 0554 08/30/23 0603  BP: (!) 150/66     Pulse: 85 (!) 113  (!) 104  Resp: 20     Temp: 98.6 F (37 C)     TempSrc: Oral     SpO2: 99%     Weight:   73.5 kg   Height:        Intake/Output Summary (Last 24 hours) at 08/30/2023 1039 Last data filed at 08/30/2023 0858 Gross per 24 hour  Intake 390.12 ml  Output 1925 ml  Net -1534.88 ml      08/30/2023    5:54 AM 08/29/2023    5:15 AM 08/26/2023    5:50 AM  Last 3 Weights  Weight (lbs) 162 lb 0.6 oz 165 lb 9.6 oz 162 lb 4.1 oz  Weight (kg) 73.5 kg 75.116 kg 73.6 kg      Telemetry    Sinus to sinus tachycardia with HR 90S-120s - Personally Reviewed -On his monitor, heart rate increases with activity.  ECG    No new tracings - Personally Reviewed  Physical Exam   GEN: No acute distress.  Actually lying down in bed, seems to be  doing relatively well as long as he is sitting still.  With movements his heart rate goes up. Neck: No JVD Cardiac: RRR, no murmurs, rubs, or gallops.  Respiratory: Mild bibasilar rales GI: Soft, nontender, non-distended  MS: No edema; No deformity. Neuro:  Nonfocal  Psych: Normal affect   Labs    High Sensitivity Troponin:   Recent Labs  Lab 08/25/23 1539 08/25/23 1851 08/26/23 1105 08/26/23 1300  TROPONINIHS 2,532* 3,072* 11,782* 10,836*     Chemistry Recent Labs  Lab 08/28/23 0249 08/29/23 0443 08/30/23 0323  NA 134* 132* 134*  K 4.2 4.4 4.0  CL 102 100 98  CO2 24 27 26   GLUCOSE 227* 214* 177*  BUN 32* 29* 34*  CREATININE 1.36* 1.26* 1.47*  CALCIUM 7.9* 8.3* 8.4*  MG 2.2 2.2 2.2  PROT 5.9* 6.0* 6.3*  ALBUMIN 2.7* 2.7* 2.8*  AST 151* 119* 106*  ALT 48* 57* 67*  ALKPHOS 46 49 60  BILITOT 0.6 1.1 0.9  GFRNONAA 52* 57* 47*  ANIONGAP 8 5 10     Lipids No results for input(s): "CHOL", "  TRIG", "HDL", "LABVLDL", "LDLCALC", "CHOLHDL" in the last 168 hours.  Hematology Recent Labs  Lab 08/28/23 0249 08/29/23 0443 08/30/23 0323  WBC 10.9* 13.1* 13.3*  RBC 3.05* 3.41* 3.37*  HGB 7.7* 8.7* 8.5*  HCT 24.8* 28.1* 27.2*  MCV 81.3 82.4 80.7  MCH 25.2* 25.5* 25.2*  MCHC 31.0 31.0 31.3  RDW 25.1* 24.9* 25.3*  PLT 268 272 286   Thyroid No results for input(s): "TSH", "FREET4" in the last 168 hours.  BNPNo results for input(s): "BNP", "PROBNP" in the last 168 hours.  DDimer No results for input(s): "DDIMER" in the last 168 hours.   Radiology    DG CHEST PORT 1 VIEW  Result Date: 08/29/2023 CLINICAL DATA:  Dyspnea. EXAM: PORTABLE CHEST 1 VIEW COMPARISON:  August 25, 2023. FINDINGS: Stable cardiomediastinal silhouette. Status post coronary artery bypass graft. Significantly increased bilateral lung opacities are noted concerning for worsening pneumonia or edema. Bony thorax is unremarkable. IMPRESSION: Significant increased bilateral lung opacities are noted concerning  for worsening pneumonia or edema. Electronically Signed   By: Lupita Raider M.D.   On: 08/29/2023 13:40    Cardiac Studies   Echo 08/2023: Preserved EF of 55 to 60%.  No RWMA.  Mild LVH.  Indeterminate diastolic parameters.  Normal RV size and function with moderately elevated PAP, but normal RAP.  Mild LA dilation.  Mild to moderate MR.  Moderate TR.  Trivial AI.  Aortic sclerosis but no stenosis.   Patient Profile     82 y.o. male with history of coronary artery disease status post CABG, acute hypoxic respiratory failure, chronic venous insufficiency, hypertension, diabetes mellitus and recent COVID-19 infection found to have elevated troponin.   Assessment & Plan    Elevated troponin  Demand ischemia vs NSTEMI CAD s/p CABG x 3 2013- last heart cath 2021 with 3/3 patent grafts - no chest pain still. - echo with preserved EF but pulmonary hypertension - planning for diagnostic LHC and RHC  - timing to be determined => would like to plan for today based on the fact that we are still trying to determine need for diuresis or not. Had IV fluids ordered for cath, I think that they have put him a little bit volume overloaded.  Will give him Lasix but continue KVO fluids for now pending cath.  Further plans for management of volume status based on cath results. - continue ASA, lipitor, BB   Sinus tachycardia - SR overnight, but ST in the 120s this morning - question if this is compensatory in the setting of COVID - also with increasing O2 requirement - on 25 mg lopressor q8hr - with ST and increasing O2, consider PE study => pending results of right left heart cath   Hypertension - 5 mg amlodipine   DM - A1c 7.3%   Chronic lower extremity edema Venous stasis  Lymphedema - at baseline   COVID-19 -Afebrile today.  However, with progressive dyspnea, may need to consider the possibility of PE.       For questions or updates, please contact [facility] Please  consult [email or web address] for contact info under        Signed, Marcelino Duster, PA  08/30/2023, 10:39 AM      ATTENDING ATTESTATION  I have seen, examined and evaluated the patient this morning on rounds along with Micah Flesher.  After reviewing all the available data and chart, we discussed the patients laboratory, study & physical findings as well as symptoms in detail.  I agree with her findings, examination as well as impression recommendations as per our discussion.    Attending adjustments noted in italics.    Plan for today will be to try to get a right left heart catheterization if not today we will do tomorrow morning to just get an assessment of his coronaries and his filling pressures.  There is concern about pulmonary hypertension which could be 1 explanation for dyspnea.  Also need to exclude true ACS versus demand ischemia.  This is all in conjunction with treating ongoing COVID infection and dyspnea symptoms.   Marykay Lex, MD, MS Bryan Lemma, M.D., M.S. Interventional Cardiologist  [facility]  Pager # [phone] Phone # [phone] [address] [place], [place] [address]

## 2023-08-30 NOTE — Progress Notes (Signed)
Occupational Therapy Treatment Patient Details Name: Thomas Gray MRN: [id number] DOB: 10-Oct-1941 Today's Date: 08/30/2023   History of present illness Pt presenting 10/03 from [facility] COVID, respiratory failure and elevated troponin. CXR demonstrates pulmonary edema. EKG with sinus tachy and first degree AV block. PMH significant for COPD, CAD s/p CABG, discoid lupus erythematosus, HTN, HLD, GERD, history of smoking.   OT comments  Session initiated to educate pt regarding energy conservation strategies. Pt active in education efforts with optimistic comments regarding how he would apply these strategies in his natural context. Pt ambulatory around room with supervision without difficulty.       If plan is discharge home, recommend the following:  Other (comment) (on pt request)   Equipment Recommendations  None recommended by OT    Recommendations for Other Services      Precautions / Restrictions Precautions Precautions: None Restrictions Weight Bearing Restrictions: No       Mobility Bed Mobility               General bed mobility comments: EOB on arrival and departure    Transfers Overall transfer level: Needs assistance   Transfers: Sit to/from Stand Sit to Stand: Supervision           General transfer comment: suoervision this session     Balance                                           ADL either performed or assessed with clinical judgement   ADL                                         General ADL Comments: Performing functional mobility today with supervision for safety. Session focused on education regarding engergy conservation and pt active learning    Extremity/Trunk Assessment Upper Extremity Assessment Upper Extremity Assessment: Right hand dominant   Lower Extremity Assessment Lower Extremity Assessment: Defer to PT evaluation        Vision   Vision Assessment?: Wears glasses for  reading   Perception Perception Perception: Within Functional Limits   Praxis Praxis Praxis: WFL    Cognition Arousal: Alert Behavior During Therapy: WFL for tasks assessed/performed Overall Cognitive Status: Within Functional Limits for tasks assessed                                          Exercises      Shoulder Instructions       General Comments      Pertinent Vitals/ Pain       Pain Assessment Pain Assessment: No/denies pain  Home Living                                          Prior Functioning/Environment              Frequency  Other (comment) (one more session after cath lab)        Progress Toward Goals  OT Goals(current goals can now be found in the care plan section)  Progress towards OT goals: Progressing toward goals  Acute Rehab OT Goals Patient Stated Goal: get out of hospital OT Goal Formulation: With patient Time For Goal Achievement: 09/09/23 Potential to Achieve Goals: Good ADL Goals Additional ADL Goal #1: Pt will identify and implement 2+ energy conservation strategies as needed.  Plan      Co-evaluation                 AM-PAC OT "6 Clicks" Daily Activity     Outcome Measure   Help from another person eating meals?: None Help from another person taking care of personal grooming?: None Help from another person toileting, which includes using toliet, bedpan, or urinal?: None Help from another person bathing (including washing, rinsing, drying)?: None Help from another person to put on and taking off regular upper body clothing?: None Help from another person to put on and taking off regular lower body clothing?: None 6 Click Score: 24    End of Session    OT Visit Diagnosis: Other (comment) (decr activity tolerance)   Activity Tolerance Patient tolerated treatment well   Patient Left with call bell/phone within reach;Other (comment) (cath lab present to get pt)   Nurse  Communication          Time: 1340-1400 OT Time Calculation (min): 20 min  Charges: OT General Charges $OT Visit: 1 Visit OT Treatments $Self Care/Home Management : 8-22 mins  Thomas Gray, OTR/L [facility] Acute Rehabilitation Office: [phone]   Thomas Gray 08/30/2023, 2:19 PM

## 2023-08-30 NOTE — Progress Notes (Signed)
Informed that someone would be up to take patient to cath lab. Patient informed at this time. OT at bedside asking to work with patient. Informed that patient will be going to cath lab soon- per OT, will work with patient until transport arrives.

## 2023-08-31 ENCOUNTER — Inpatient Hospital Stay (HOSPITAL_COMMUNITY): Payer: Medicare Other

## 2023-08-31 ENCOUNTER — Encounter (HOSPITAL_COMMUNITY): Payer: Self-pay | Admitting: Cardiology

## 2023-08-31 DIAGNOSIS — Z951 Presence of aortocoronary bypass graft: Secondary | ICD-10-CM | POA: Diagnosis not present

## 2023-08-31 DIAGNOSIS — I2510 Atherosclerotic heart disease of native coronary artery without angina pectoris: Secondary | ICD-10-CM | POA: Diagnosis not present

## 2023-08-31 DIAGNOSIS — E1169 Type 2 diabetes mellitus with other specified complication: Secondary | ICD-10-CM

## 2023-08-31 DIAGNOSIS — I2723 Pulmonary hypertension due to lung diseases and hypoxia: Secondary | ICD-10-CM

## 2023-08-31 DIAGNOSIS — U071 COVID-19: Secondary | ICD-10-CM | POA: Diagnosis not present

## 2023-08-31 DIAGNOSIS — J9601 Acute respiratory failure with hypoxia: Secondary | ICD-10-CM

## 2023-08-31 DIAGNOSIS — N1831 Chronic kidney disease, stage 3a: Secondary | ICD-10-CM

## 2023-08-31 DIAGNOSIS — E785 Hyperlipidemia, unspecified: Secondary | ICD-10-CM

## 2023-08-31 DIAGNOSIS — E119 Type 2 diabetes mellitus without complications: Secondary | ICD-10-CM

## 2023-08-31 DIAGNOSIS — I2489 Other forms of acute ischemic heart disease: Secondary | ICD-10-CM

## 2023-08-31 LAB — COMPREHENSIVE METABOLIC PANEL
ALT: 50 U/L — ABNORMAL HIGH (ref 0–44)
AST: 70 U/L — ABNORMAL HIGH (ref 15–41)
Albumin: 2.2 g/dL — ABNORMAL LOW (ref 3.5–5.0)
Alkaline Phosphatase: 48 U/L (ref 38–126)
Anion gap: 9 (ref 5–15)
BUN: 34 mg/dL — ABNORMAL HIGH (ref 8–23)
CO2: 26 mmol/L (ref 22–32)
Calcium: 7.8 mg/dL — ABNORMAL LOW (ref 8.9–10.3)
Chloride: 102 mmol/L (ref 98–111)
Creatinine, Ser: 1.43 mg/dL — ABNORMAL HIGH (ref 0.61–1.24)
GFR, Estimated: 49 mL/min — ABNORMAL LOW (ref >60)
Glucose, Bld: 156 mg/dL — ABNORMAL HIGH (ref 70–99)
Potassium: 3.9 mmol/L (ref 3.5–5.1)
Sodium: 137 mmol/L (ref 135–145)
Total Bilirubin: 1 mg/dL (ref 0.3–1.2)
Total Protein: 5.2 g/dL — ABNORMAL LOW (ref 6.5–8.1)

## 2023-08-31 LAB — GLUCOSE, CAPILLARY
Glucose-Capillary: 126 mg/dL — ABNORMAL HIGH (ref 70–99)
Glucose-Capillary: 182 mg/dL — ABNORMAL HIGH (ref 70–99)
Glucose-Capillary: 190 mg/dL — ABNORMAL HIGH (ref 70–99)
Glucose-Capillary: 168 mg/dL — ABNORMAL HIGH (ref 70–99)

## 2023-08-31 LAB — CBC WITH DIFFERENTIAL/PLATELET
Abs Immature Granulocytes: 0 10*3/uL (ref 0.00–0.07)
Basophils Absolute: 0 10*3/uL (ref 0.0–0.1)
Basophils Relative: 0 %
Eosinophils Absolute: 0 10*3/uL (ref 0.0–0.5)
Eosinophils Relative: 0 %
HCT: 25.3 % — ABNORMAL LOW (ref 39.0–52.0)
Hemoglobin: 8.1 g/dL — ABNORMAL LOW (ref 13.0–17.0)
Lymphocytes Relative: 5 %
Lymphs Abs: 0.6 10*3/uL — ABNORMAL LOW (ref 0.7–4.0)
MCH: 26.3 pg (ref 26.0–34.0)
MCHC: 32 g/dL (ref 30.0–36.0)
MCV: 82.1 fL (ref 80.0–100.0)
Monocytes Absolute: 0.1 10*3/uL (ref 0.1–1.0)
Monocytes Relative: 1 %
Neutro Abs: 10.5 10*3/uL — ABNORMAL HIGH (ref 1.7–7.7)
Neutrophils Relative %: 94 %
Platelets: 229 10*3/uL (ref 150–400)
RBC: 3.08 MIL/uL — ABNORMAL LOW (ref 4.22–5.81)
RDW: 25.4 % — ABNORMAL HIGH (ref 11.5–15.5)
WBC: 11.2 10*3/uL — ABNORMAL HIGH (ref 4.0–10.5)
nRBC: 0.4 % — ABNORMAL HIGH (ref 0.0–0.2)
nRBC: 1 /100{WBCs} — ABNORMAL HIGH

## 2023-08-31 LAB — LIPOPROTEIN A (LPA): Lipoprotein (a): 159.3 nmol/L — ABNORMAL HIGH (ref <75.0)

## 2023-08-31 LAB — MAGNESIUM: Magnesium: 2.2 mg/dL (ref 1.7–2.4)

## 2023-08-31 MED ORDER — IOHEXOL 350 MG/ML SOLN
65.0000 mL | Freq: Once | INTRAVENOUS | Status: AC | PRN
  Administered 2023-08-31: 65 mL via INTRAVENOUS

## 2023-08-31 MED ORDER — METOPROLOL TARTRATE 50 MG PO TABS
50.0000 mg | ORAL_TABLET | Freq: Two times a day (BID) | ORAL | Status: DC
  Administered 2023-08-31 – 2023-09-02 (×4): 50 mg via ORAL
  Filled 2023-08-31 (×4): qty 1

## 2023-08-31 NOTE — Assessment & Plan Note (Signed)
Was initially treated with IV steroids.  Now back on prednisone p.o.  will reduce prednisone to 40 mg daily. Continue with nebs as needed. TOC to arrange home neb machine.

## 2023-08-31 NOTE — Assessment & Plan Note (Addendum)
Admitted for covid and respiratory failure. Did not normally use home O2 prior to this admission. Pt states he has not had covid infection before.  Still on 4 L/min. Has qualified for home O2 @  4 L/min.  CTPA negative for PE  Will reduce prednisone to 40 mg daily. 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  Have arranged for home O2 @ 4 L/min and home nebulizer machine.

## 2023-08-31 NOTE — Assessment & Plan Note (Signed)
Patent grafts on cath yesterday.

## 2023-08-31 NOTE — Plan of Care (Signed)

## 2023-08-31 NOTE — Plan of Care (Signed)
°  Problem: Education: °Goal: Knowledge of General Education information will improve °Description: Including pain rating scale, medication(s)/side effects and non-pharmacologic comfort measures °Outcome: Progressing °  °Problem: Clinical Measurements: °Goal: Ability to maintain clinical measurements within normal limits will improve °Outcome: Progressing °  °Problem: Nutrition: °Goal: Adequate nutrition will be maintained °Outcome: Progressing °  °

## 2023-08-31 NOTE — Assessment & Plan Note (Signed)
Likely due to COVID-19.  has been on oxygen since admission.  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  He will need long-term oxygen.  PE ruled out by CTPA.

## 2023-08-31 NOTE — Assessment & Plan Note (Addendum)
Remains on Lantus and NovoLog.  Hemoglobin A1c during this admission 7.3%. Pt will be discharged to home with his same home regimen of metformin and [place]. He should f/u with PCP regarding his outpatient DM treatment.

## 2023-08-31 NOTE — Hospital Course (Signed)
HPI: Thomas Gray  is a 82 y.o. male, with past medical history of heavy smoking in the past, quit 13 years ago, COPD, not home oxygen, history of CAD status post CABG, carotid artery disease, discoid lupus erythematosus, hypertension, hyperlipidemia and GERD. -Patient was sent to [facility] secondary to complaints of cough, shortness of breath, patient was transferred to [facility] due to concern of NSTEMI, patient reports his son was diagnosed with COVID last week, he was exposed to him, reports dyspnea over the last few days, cough, he denies fever, chills, chest pain, no nausea or vomiting, but reports generalized weakness, frail, enterography ED initially patient requiring 8 L oxygen, but currently upon my evaluation he is on 2 L oxygen, by reviewing records from [place] it does appear he had elevated troponins at 0.66, (normal less than 0.04, labs were significant for sodium of 138, potassium of 4.2, carbon dioxide of 23, anion gap of 14, creatinine of 1.2, GFR of 58, glucose of 207, lactic acid elevated at 3.7, BNP elevated at 2590 (normal up to 1800), chest x-ray significant for vascular congestion, patient was started on heparin gtt. and transfer was requested to [facility] for possible NSTEMI, cardiology accepted the patient and recommended admission to the hospitalist service due to multiple comorbidities.    Significant Events: Admitted 08/25/2023 for covid infection, respiratory failure with hypoxia and elevated troponin   Significant Labs: Admitting troponin 2532->3072->11782  Significant Imaging Studies: 08-26-2023 echo LVEF 60% 2023-09-25 LHC shows LM: No significant disease LAD: Mid occluded. Lcx: Proximally occluded. Ramus: Proximally subtotally occluded. RCA: Distally occluded, with mid severe disease. LIMA-LAD: Patent, no significant disease. SVG-PDA/PL: Patent, no significant disease. SVG-ramus: Patent, no significant disease LHC shows Multivessel sever coronary artery  disease. Patent 3/3 grafts. Compensated filling pressures. Mild PH 10/07-2023 CTPA negative for PE.  Antibiotic Therapy: Anti-infectives (From admission, onward)    None       Procedures: Sep 25, 2023 LHC/RHC  Consultants: Cardiology

## 2023-08-31 NOTE — Assessment & Plan Note (Signed)
Stable

## 2023-08-31 NOTE — Assessment & Plan Note (Signed)
Stable.  On Lopressor 50 mg twice daily, Norvasc 5 mg daily.

## 2023-08-31 NOTE — Assessment & Plan Note (Signed)
 Continue with Lipitor 40 mg daily

## 2023-08-31 NOTE — Assessment & Plan Note (Signed)
 Currently at baseline 

## 2023-08-31 NOTE — Subjective & Objective (Signed)
Patient seen and examined.  Still on 4-5 L of oxygen.  No wheezing.  Still feels tired.  States that he has never had COVID before.  on p.o. steroids.  Had left heart catheterization that showed patent grafts.  CTPA negative for PE.  Pt again said that he is going home. Declines home health PT. Lives alone. Pt states he has 3 sons that will help him along with his neighbors.  Pt qualified for home O2 @ 4L/min.  Pt's son that would drive him home works 3rd shift and starts at 5 pm.  Trying to arrange for home O2 and nebulizer machine.  Plan on DC in AM.

## 2023-08-31 NOTE — Progress Notes (Signed)
PROGRESS NOTE    Thomas Gray  JOA:[id number] DOB: 1941/07/17 DOA: 08/25/2023 PCP: Olive Bass, MD  Subjective: Patient seen and examined.  Still on 4 L of oxygen.  No wheezing.  Still feels tired.  States that he has never had COVID before.  On p.o. steroids.  Had left heart catheterization that showed patent grafts.   Hospital Course: HPI: Thomas Gray  is a 82 y.o. male, with past medical history of heavy smoking in the past, quit 13 years ago, COPD, not home oxygen, history of CAD status post CABG, carotid artery disease, discoid lupus erythematosus, hypertension, hyperlipidemia and GERD. -Patient was sent to [facility] secondary to complaints of cough, shortness of breath, patient was transferred to [facility] due to concern of NSTEMI, patient reports his son was diagnosed with COVID last week, he was exposed to him, reports dyspnea over the last few days, cough, he denies fever, chills, chest pain, no nausea or vomiting, but reports generalized weakness, frail, enterography ED initially patient requiring 8 L oxygen, but currently upon my evaluation he is on 2 L oxygen, by reviewing records from [place] it does appear he had elevated troponins at 0.66, (normal less than 0.04, labs were significant for sodium of 138, potassium of 4.2, carbon dioxide of 23, anion gap of 14, creatinine of 1.2, GFR of 58, glucose of 207, lactic acid elevated at 3.7, BNP elevated at 2590 (normal up to 1800), chest x-ray significant for vascular congestion, patient was started on heparin gtt. and transfer was requested to [facility] for possible NSTEMI, cardiology accepted the patient and recommended admission to the hospitalist service due to multiple comorbidities.    Significant Events: Admitted 08/25/2023 for covid infection, respiratory failure with hypoxia and elevated troponin   Significant Labs: Admitting troponin 2532->[id number]->[id number]  Significant Imaging Studies: 08-26-2023 echo LVEF  60% 08-30-2023 LHC shows LM: No significant disease LAD: Mid occluded. Lcx: Proximally occluded. Ramus: Proximally subtotally occluded. RCA: Distally occluded, with mid severe disease. LIMA-LAD: Patent, no significant disease. SVG-PDA/PL: Patent, no significant disease. SVG-ramus: Patent, no significant disease LHC shows Multivessel sever coronary artery disease. Patent 3/3 grafts. Compensated filling pressures. Mild PH  Antibiotic Therapy: Anti-infectives (From admission, onward)    None       Procedures:   Consultants:     Assessment and Plan: * COVID-19 virus infection Admitted for covid and respiratory failure. Did not normally use home O2 prior to this admission. Pt states he has not had covid infection before.  Still on 4 L/min. May need home O2 at discharge. Cardiology wants to rule out PE as cause of continued hypoxia.  Will check CTPA.  Acute respiratory failure with hypoxia (HCC) Likely due to COVID-19.  Is been on oxygen since admission.  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  May need long-term oxygen.  Will rule out PE per cardiology recommendations.  Demand ischemia of myocardium (HCC) Likely due to hypoxia related to covid infection. LHC yesterday shows patent 3 of 3 grafts.  COPD exacerbation (HCC) Was initially treated with IV steroids.  Now back on prednisone p.o.  Continue with nebs as needed.  Type 2 diabetes mellitus without complication, without long-term current use of insulin (HCC) Remains on Lantus and NovoLog.  Hemoglobin A1c during this admission 7.3%.  Peripheral vascular disease (HCC) Stable.  CKD stage 3a, GFR 45-59 ml/min (HCC) - baseline SCr 1.3-1.6 Currently at baseline.  Hyperlipidemia associated with type 2 diabetes mellitus (HCC) Continue with Lipitor 40 mg daily.  Essential hypertension, benign Stable.  On Lopressor 50 mg twice daily, Norvasc 5 mg daily.  S/P CABG x 4 Patent grafts on cath yesterday.  CAD, multiple vessel Stable.   DVT  prophylaxis: SCD's Start: 08/30/23 1705 enoxaparin (LOVENOX) injection 40 mg Start: 08/30/23 0000     Code Status: Full Code Family Communication: no family at bedside Disposition Plan: return home Reason for continuing need for hospitalization: need to r/o PE with CTPA  Objective: Vitals:   08/31/23 0102 08/31/23 0515 08/31/23 0744 08/31/23 1535  BP: (!) 98/55 112/64 (!) 157/55 (!) 148/62  Pulse: 79 81 85 92  Resp: 17 18 20 19   Temp: 97.7 F (36.5 C) 99 F (37.2 C) 98.1 F (36.7 C) 98.3 F (36.8 C)  TempSrc: Oral Oral Oral Oral  SpO2: 96% 96% 92% 98%  Weight:  74.8 kg    Height:        Intake/Output Summary (Last 24 hours) at 08/31/2023 1642 Last data filed at 08/31/2023 1202 Gross per 24 hour  Intake 418 ml  Output 1300 ml  Net -882 ml   Filed Weights   08/29/23 0515 08/30/23 0554 08/31/23 0515  Weight: 75.1 kg 73.5 kg 74.8 kg    Examination:  Physical Exam Vitals and nursing note reviewed.  Constitutional:      Appearance: He is not toxic-appearing.  HENT:     Head: Normocephalic and atraumatic.     Nose: Nose normal.  Eyes:     General: No scleral icterus. Cardiovascular:     Rate and Rhythm: Normal rate and regular rhythm.     Pulses: Normal pulses.  Pulmonary:     Effort: Pulmonary effort is normal. No respiratory distress.  Abdominal:     General: Bowel sounds are normal. There is no distension.     Palpations: Abdomen is soft.  Musculoskeletal:     Right lower leg: No edema.     Left lower leg: No edema.  Skin:    General: Skin is warm and dry.     Capillary Refill: Capillary refill takes less than 2 seconds.  Neurological:     General: No focal deficit present.     Mental Status: He is alert and oriented to person, place, and time.     Data Reviewed: I have personally reviewed following labs and imaging studies  CBC: Recent Labs  Lab 08/27/23 0318 08/28/23 0249 08/29/23 0443 08/30/23 0323 08/30/23 1541 08/31/23 0330  WBC 17.7*  10.9* 13.1* 13.3*  --  11.2*  NEUTROABS  --   --   --   --   --  10.5*  HGB 8.2* 7.7* 8.7* 8.5* 8.8*  8.8* 8.1*  HCT 27.0* 24.8* 28.1* 27.2* 26.0*  26.0* 25.3*  MCV 83.9 81.3 82.4 80.7  --  82.1  PLT 268 268 272 286  --  229   Basic Metabolic Panel: Recent Labs  Lab 08/25/23 1539 08/26/23 0340 08/27/23 0318 08/28/23 0249 08/29/23 0443 08/30/23 0323 08/30/23 1541 08/31/23 0330  NA 137   < > 136 134* 132* 134* 135  135 137  K 4.0   < > 4.4 4.2 4.4 4.0 4.5  4.5 3.9  CL 104   < > 102 102 100 98  --  102  CO2 18*   < > 25 24 27 26   --  26  GLUCOSE 258*   < > 178* 227* 214* 177*  --  156*  BUN 20   < > 31* 32* 29* 34*  --  34*  CREATININE 1.65*   < > 1.35* 1.36* 1.26* 1.47*  --  1.43*  CALCIUM 8.2*   < > 8.0* 7.9* 8.3* 8.4*  --  7.8*  MG 1.7  --  2.0 2.2 2.2 2.2  --  2.2  PHOS 2.7  --   --   --   --   --   --   --    < > = values in this interval not displayed.   GFR: Estimated Creatinine Clearance: 37 mL/min (A) (by C-G formula based on SCr of 1.43 mg/dL (H)). Liver Function Tests: Recent Labs  Lab 08/27/23 0318 08/28/23 0249 08/29/23 0443 08/30/23 0323 08/31/23 0330  AST 177* 151* 119* 106* 70*  ALT 43 48* 57* 67* 50*  ALKPHOS 46 46 49 60 48  BILITOT 0.6 0.6 1.1 0.9 1.0  PROT 6.0* 5.9* 6.0* 6.3* 5.2*  ALBUMIN 2.7* 2.7* 2.7* 2.8* 2.2*   Coagulation Profile: Recent Labs  Lab 08/25/23 1539  INR 1.2   CBG: Recent Labs  Lab 08/30/23 1647 08/30/23 2039 08/31/23 0632 08/31/23 1101 08/31/23 1533  GLUCAP 202* 239* 126* 182* 190*   Sepsis Labs: Recent Labs  Lab 08/25/23 1539  PROCALCITON 0.37    Recent Results (from the past 240 hour(s))  SARS Coronavirus 2 by RT PCR (hospital order, performed in [facility] hospital lab) *cepheid single result test* Anterior Nasal Swab     Status: Abnormal   Collection Time: 08/25/23  2:13 PM   Specimen: Anterior Nasal Swab  Result Value Ref Range Status   SARS Coronavirus 2 by RT PCR POSITIVE (A) NEGATIVE Final     Comment: Performed at [facility], [address] [place], [place] [address]  Urine Culture (for pregnant, neutropenic or urologic patients or patients with an indwelling urinary catheter)     Status: None   Collection Time: 08/29/23  1:14 PM   Specimen: Urine, Clean Catch  Result Value Ref Range Status   Specimen Description URINE, CLEAN CATCH  Final   Special Requests NONE  Final   Culture   Final    NO GROWTH Performed at [facility], [address], [place], [place] [address]    Report Status 08/30/2023 FINAL  Final  Culture, blood (Routine X 2) w Reflex to ID Panel     Status: None (Preliminary result)   Collection Time: 08/29/23  3:15 PM   Specimen: BLOOD LEFT HAND  Result Value Ref Range Status   Specimen Description BLOOD LEFT HAND  Final   Special Requests   Final    BOTTLES DRAWN AEROBIC AND ANAEROBIC Blood Culture adequate volume   Culture   Final    NO GROWTH 2 DAYS Performed at [facility], [address] [place], [place] [address]    Report Status PENDING  Incomplete  Culture, blood (Routine X 2) w Reflex to ID Panel     Status: None (Preliminary result)   Collection Time: 08/29/23  4:12 PM   Specimen: BLOOD LEFT ARM  Result Value Ref Range Status   Specimen Description BLOOD LEFT ARM  Final   Special Requests   Final    BOTTLES DRAWN AEROBIC AND ANAEROBIC Blood Culture adequate volume   Culture   Final    NO GROWTH 2 DAYS Performed at [facility], [address] [place], [place] [address]    Report Status PENDING  Incomplete     Radiology Studies: CARDIAC CATHETERIZATION  Result Date:  08/30/2023 Images from the original result were not included. Coronary and bypass graft angiography 08/30/2023: LM: No significant disease LAD: Mid occluded Lcx: Proximally occluded Ramus: Proximally subtotally occluded RCA: Distally occluded, with mid severe disease LIMA-LAD: Patent, no significant disease SVG-PDA/PL: Patent, no significant disease  SVG-ramus: Patent, no significant disease Right heart catheterization 08/30/2023: RA: 1 mmHg RV: 45/0 mmHg PA: 49/14 mmHg, mPAP 28 mmHg PCW: 10 mmHg CO: 8.4 L/min CI: 4.6 L/min/m2 Multivessel sever coronary artery disease Patent 3/3 grafts Compensated filling pressures Mild PH NSTEMI likely type 2 MI Manish Emiliano Dyer, MD   Scheduled Meds:  amLODipine  5 mg Oral Daily   arformoterol  15 mcg Nebulization BID   aspirin EC  81 mg Oral QPM   atorvastatin  40 mg Oral QPM   budesonide (PULMICORT) nebulizer solution  0.25 mg Nebulization BID   enoxaparin (LOVENOX) injection  40 mg Subcutaneous Q24H   insulin aspart  0-15 Units Subcutaneous TID WC   insulin aspart  0-5 Units Subcutaneous QHS   insulin glargine-yfgn  10 Units Subcutaneous Daily   ipratropium-albuterol  3 mL Nebulization BID   metoprolol tartrate  50 mg Oral BID   pantoprazole  40 mg Oral Daily   predniSONE  50 mg Oral Daily   sodium chloride flush  3 mL Intravenous Q12H   sodium chloride flush  3 mL Intravenous Q12H   Continuous Infusions:   LOS: 6 days   Time spent: 40 minutes  Carollee Herter, DO  [facility]  08/31/2023, 4:42 PM

## 2023-08-31 NOTE — Progress Notes (Addendum)
Patient Name: Thomas Gray Date of Encounter: 08/31/2023 [facility] Cardiologist: Nanetta Batty, MD   Interval Summary  .    Sitting up on the side of the bed. No chest pain, continues to feel short of breath. Remains on 4L [place]  Vital Signs .    Vitals:   08/30/23 2037 08/31/23 0102 08/31/23 0515 08/31/23 0744  BP: 122/69 (!) 98/55 112/64 (!) 157/55  Pulse: 83 79 81 85  Resp:  17 18 20   Temp: 97.7 F (36.5 C) 97.7 F (36.5 C) 99 F (37.2 C) 98.1 F (36.7 C)  TempSrc: Oral Oral Oral Oral  SpO2: 98% 96% 96% 92%  Weight:   74.8 kg   Height:        Intake/Output Summary (Last 24 hours) at 08/31/2023 0936 Last data filed at 08/31/2023 1914 Gross per 24 hour  Intake 418 ml  Output 1800 ml  Net -1382 ml      08/31/2023    5:15 AM 08/30/2023    5:54 AM 08/29/2023    5:15 AM  Last 3 Weights  Weight (lbs) 164 lb 14.5 oz 162 lb 0.6 oz 165 lb 9.6 oz  Weight (kg) 74.8 kg 73.5 kg 75.116 kg      Telemetry/ECG    Sinus Rhythm--> Sinus Tachycardia - Personally Reviewed  Physical Exam .   GEN: No acute distress.   Neck: No JVD Cardiac: RRR, no murmurs, rubs, or gallops.  Respiratory: diminished in bases, rhonchi GI: Soft, nontender, non-distended  MS: No edema  Assessment & Plan .     Principal Problem:   COVID-19 virus infection Active Problems:   Demand ischemia of myocardium (HCC)   CAD, multiple vessel   S/P CABG x 4   COPD exacerbation (HCC)   Chronic kidney disease, stage 3 (HCC)   Hyperlipidemia associated with type 2 diabetes mellitus (HCC)   Type 2 diabetes mellitus without complication, without long-term current use of insulin (HCC)   Essential hypertension, benign   Carotid artery disease (HCC)   Peripheral vascular disease (HCC)   Cath Findings are not c/w ACS/ NSTEMI (technically would be type II MI/demand ischemia-infarction.)-will change diagnosis CAD s/p CABG x 3 2013 -- denies any chest pain, continues to feel short of breath -- Echo  with preserved EF but pulmonary hypertension PA: 49/14 mmHg, mPAP 28 mmHg, mild to moderate pulmonary pretension PCW: 10 mmHg => TPG 18 indicating primary pulmonary etiology as opposed to cardiac -- underwent R/LHC yesterday with patent 3/3 grafts. Compensated filling pressures. IV lasix has been stopped.   -- continue ASA, lipitor, BB Renal function stable post cath   Sinus tachycardia -- SR overnight, but ST at times into the 110s, remains on O2 -- switch to metoprolol 50mg  BID (home dose) -- remains hypoxic, consider PE study     Hypertension -- 5 mg amlodipine, -- consolidate to metoprolol 50mg  BID   DM -- A1c 7.3% -- on farxgia PTA, would consider resuming tomorrow   Chronic lower extremity edema Venous stasis  Lymphedema -- at baseline   COVID-19 -- remains on O2 @4L  -- per primary    For questions or updates, please contact [facility] Please consult [email or web address] for contact info under        Signed, Laverda Page, NP    ATTENDING ATTESTATION  I have seen, examined and evaluated the patient this morning on rounds along with Laverda Page, NP.  After reviewing all the available data and chart, we discussed  the patients laboratory, study & physical findings as well as symptoms in detail.  I agree with her findings, examination as well as impression recommendations as per our discussion.    Attending adjustments noted in italics.   Very reassuring cardiac catheterization yesterday with patent grafts and native disease stable compared to last report.  No obvious culprit lesion to suggest true ACS presentation as etiology for troponin elevation.  More demand ischemia/infarction.  This has higher negative prognostic indication based on endorgan damage in the setting of COVID however does not require any more invasive or aggressive cardiac treatment.  Transpulmonary gradient of 18 with normal LVEDP and PCWP with indicate that he is adequately diuresed,  and his pulmonary pressures are elevated because of ongoing and internal lung issues.  Since he remains somewhat hypoxic, I do agree that it may be not unreasonable to assess for PE, given hypercoagulable nature of COVID.  Will follow along, but at this point no more active cardiac recommendations.     Marykay Lex, MD, MS Bryan Lemma, M.D., M.S. Interventional Cardiologist  [facility]  Pager # [phone] Phone # [phone] [address] [place], [place] [address]

## 2023-08-31 NOTE — Assessment & Plan Note (Addendum)
Likely due to hypoxia related to covid infection. LHC yesterday shows patent 3 of 3 grafts.  Cardiology did not think pt's BNP was related to his cardiac disease but rather pulmonary disease.

## 2023-09-01 DIAGNOSIS — U071 COVID-19: Secondary | ICD-10-CM | POA: Diagnosis not present

## 2023-09-01 DIAGNOSIS — J441 Chronic obstructive pulmonary disease with (acute) exacerbation: Secondary | ICD-10-CM | POA: Diagnosis not present

## 2023-09-01 DIAGNOSIS — I2489 Other forms of acute ischemic heart disease: Secondary | ICD-10-CM | POA: Diagnosis not present

## 2023-09-01 DIAGNOSIS — J9601 Acute respiratory failure with hypoxia: Secondary | ICD-10-CM | POA: Diagnosis not present

## 2023-09-01 LAB — COMPREHENSIVE METABOLIC PANEL
ALT: 50 U/L — ABNORMAL HIGH (ref 0–44)
AST: 68 U/L — ABNORMAL HIGH (ref 15–41)
Albumin: 2.3 g/dL — ABNORMAL LOW (ref 3.5–5.0)
Alkaline Phosphatase: 58 U/L (ref 38–126)
Anion gap: 9 (ref 5–15)
BUN: 30 mg/dL — ABNORMAL HIGH (ref 8–23)
CO2: 28 mmol/L (ref 22–32)
Calcium: 8 mg/dL — ABNORMAL LOW (ref 8.9–10.3)
Chloride: 98 mmol/L (ref 98–111)
Creatinine, Ser: 1.45 mg/dL — ABNORMAL HIGH (ref 0.61–1.24)
GFR, Estimated: 48 mL/min — ABNORMAL LOW (ref >60)
Glucose, Bld: 138 mg/dL — ABNORMAL HIGH (ref 70–99)
Potassium: 4 mmol/L (ref 3.5–5.1)
Sodium: 135 mmol/L (ref 135–145)
Total Bilirubin: 1.2 mg/dL (ref 0.3–1.2)
Total Protein: 5.6 g/dL — ABNORMAL LOW (ref 6.5–8.1)

## 2023-09-01 LAB — BRAIN NATRIURETIC PEPTIDE: B Natriuretic Peptide: 294.2 pg/mL — ABNORMAL HIGH (ref 0.0–100.0)

## 2023-09-01 LAB — MAGNESIUM: Magnesium: 2.3 mg/dL (ref 1.7–2.4)

## 2023-09-01 LAB — GLUCOSE, CAPILLARY
Glucose-Capillary: 108 mg/dL — ABNORMAL HIGH (ref 70–99)
Glucose-Capillary: 159 mg/dL — ABNORMAL HIGH (ref 70–99)
Glucose-Capillary: 175 mg/dL — ABNORMAL HIGH (ref 70–99)
Glucose-Capillary: 254 mg/dL — ABNORMAL HIGH (ref 70–99)

## 2023-09-01 MED ORDER — PREDNISONE 20 MG PO TABS
40.0000 mg | ORAL_TABLET | Freq: Every day | ORAL | Status: DC
  Administered 2023-09-02: 40 mg via ORAL
  Filled 2023-09-01: qty 2

## 2023-09-01 MED FILL — Verapamil HCl IV Soln 2.5 MG/ML: INTRAVENOUS | Qty: 2 | Status: AC

## 2023-09-01 NOTE — Progress Notes (Signed)
PROGRESS NOTE    Thomas Gray  WUJ:[id number] DOB: 08-28-1941 DOA: 08/25/2023 PCP: Olive Bass, MD  Subjective: Patient seen and examined.  Still on 4-5 L of oxygen.  No wheezing.  Still feels tired.  States that he has never had COVID before.  on p.o. steroids.  Had left heart catheterization that showed patent grafts.  CTPA negative for PE.  Pt again said that he is going home. Declines home health PT. Lives alone. Pt states he has 3 sons that will help him along with his neighbors.  Pt qualified for home O2 @ 4L/min.  Pt's son that would drive him home works 3rd shift and starts at 5 pm.  Trying to arrange for home O2 and nebulizer machine.  Plan on DC in AM.   Hospital Course: HPI: Thomas Gray  is a 82 y.o. male, with past medical history of heavy smoking in the past, quit 13 years ago, COPD, not home oxygen, history of CAD status post CABG, carotid artery disease, discoid lupus erythematosus, hypertension, hyperlipidemia and GERD. -Patient was sent to [facility] secondary to complaints of cough, shortness of breath, patient was transferred to [facility] due to concern of NSTEMI, patient reports his son was diagnosed with COVID last week, he was exposed to him, reports dyspnea over the last few days, cough, he denies fever, chills, chest pain, no nausea or vomiting, but reports generalized weakness, frail, enterography ED initially patient requiring 8 L oxygen, but currently upon my evaluation he is on 2 L oxygen, by reviewing records from [place] it does appear he had elevated troponins at 0.66, (normal less than 0.04, labs were significant for sodium of 138, potassium of 4.2, carbon dioxide of 23, anion gap of 14, creatinine of 1.2, GFR of 58, glucose of 207, lactic acid elevated at 3.7, BNP elevated at 2590 (normal up to 1800), chest x-ray significant for vascular congestion, patient was started on heparin gtt. and transfer was requested to [facility] for possible  NSTEMI, cardiology accepted the patient and recommended admission to the hospitalist service due to multiple comorbidities.    Significant Events: Admitted 08/25/2023 for covid infection, respiratory failure with hypoxia and elevated troponin   Significant Labs: Admitting troponin 2532->[id number]->11782  Significant Imaging Studies: 08-26-2023 echo LVEF 60% Sep 10, 2023 LHC shows LM: No significant disease LAD: Mid occluded. Lcx: Proximally occluded. Ramus: Proximally subtotally occluded. RCA: Distally occluded, with mid severe disease. LIMA-LAD: Patent, no significant disease. SVG-PDA/PL: Patent, no significant disease. SVG-ramus: Patent, no significant disease LHC shows Multivessel sever coronary artery disease. Patent 3/3 grafts. Compensated filling pressures. Mild PH 10/07-2023 CTPA negative for PE.  Antibiotic Therapy: Anti-infectives (From admission, onward)    None       Procedures: 10-Sep-2023 LHC/RHC  Consultants: Cardiology    Assessment and Plan: * COVID-19 virus infection Admitted for covid and respiratory failure. Did not normally use home O2 prior to this admission. Pt states he has not had covid infection before.  Still on 4 L/min. Has qualified for home O2 @  4 L/min.  CTPA negative for PE  Will reduce prednisone to 40 mg daily.  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  Acute respiratory failure with hypoxia (HCC) Likely due to COVID-19.  has been on oxygen since admission.  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  He will need long-term oxygen.  PE ruled out by CTPA.  Demand ischemia of myocardium (HCC) Likely due to hypoxia related  to covid infection. LHC yesterday shows patent 3 of 3 grafts.  COPD exacerbation (HCC) Was initially treated with IV steroids.  Now back on prednisone p.o.  will reduce prednisone to 40 mg daily. Continue with nebs as needed. TOC to arrange home neb machine.  Type 2 diabetes mellitus without  complication, without long-term current use of insulin (HCC) Remains on Lantus and NovoLog.  Hemoglobin A1c during this admission 7.3%.  Peripheral vascular disease (HCC) Stable.  CKD stage 3a, GFR 45-59 ml/min (HCC) - baseline SCr 1.3-1.6 Currently at baseline.  Hyperlipidemia associated with type 2 diabetes mellitus (HCC) Continue with Lipitor 40 mg daily.  Essential hypertension, benign Stable.  On Lopressor 50 mg twice daily, Norvasc 5 mg daily.  S/P CABG x 4 Patent grafts on cath yesterday.  CAD, multiple vessel Stable.   DVT prophylaxis: SCD's Start: 08/30/23 1705 enoxaparin (LOVENOX) injection 40 mg Start: 08/30/23 0000    Code Status: Full Code Family Communication: no family at bedside. Pt is decisional Disposition Plan: return home Reason for continuing need for hospitalization: medically stable. Trying to arrange for home O2 and neb machine. Pt's son will pick him up tomorrow morning.  Objective: Vitals:   09/01/23 0606 09/01/23 0748 09/01/23 0840 09/01/23 1052  BP: (!) 158/69 (!) 157/50  (!) 136/55  Pulse: 80 (!) 103  83  Resp: 18 18  18   Temp: (!) 97.4 F (36.3 C) 98.3 F (36.8 C)  99.6 F (37.6 C)  TempSrc: Oral Oral  Oral  SpO2: 92% 93% 95% 99%  Weight: 70.6 kg     Height:        Intake/Output Summary (Last 24 hours) at 09/01/2023 1401 Last data filed at 09/01/2023 0939 Gross per 24 hour  Intake --  Output 975 ml  Net -975 ml   Filed Weights   08/30/23 0554 08/31/23 0515 09/01/23 0606  Weight: 73.5 kg 74.8 kg 70.6 kg    Examination:  Physical Exam Vitals and nursing note reviewed.  Constitutional:      Appearance: He is not toxic-appearing.  HENT:     Head: Normocephalic and atraumatic.     Nose: Nose normal.  Eyes:     General: No scleral icterus. Cardiovascular:     Rate and Rhythm: Normal rate and regular rhythm.     Pulses: Normal pulses.  Pulmonary:     Effort: Pulmonary effort is normal. No respiratory distress.     Breath  sounds: No rales.  Abdominal:     General: Bowel sounds are normal. There is no distension.     Palpations: Abdomen is soft.  Musculoskeletal:     Right lower leg: No edema.     Left lower leg: No edema.  Skin:    General: Skin is warm and dry.     Capillary Refill: Capillary refill takes less than 2 seconds.  Neurological:     General: No focal deficit present.     Mental Status: He is alert and oriented to person, place, and time.     Data Reviewed: I have personally reviewed following labs and imaging studies  CBC: Recent Labs  Lab 08/27/23 0318 08/28/23 0249 08/29/23 0443 08/30/23 0323 08/30/23 1541 08/31/23 0330  WBC 17.7* 10.9* 13.1* 13.3*  --  11.2*  NEUTROABS  --   --   --   --   --  10.5*  HGB 8.2* 7.7* 8.7* 8.5* 8.8*  8.8* 8.1*  HCT 27.0* 24.8* 28.1* 27.2* 26.0*  26.0* 25.3*  MCV  83.9 81.3 82.4 80.7  --  82.1  PLT 268 268 272 286  --  229   Basic Metabolic Panel: Recent Labs  Lab 08/25/23 1539 08/26/23 0340 08/28/23 0249 08/29/23 0443 08/30/23 0323 08/30/23 1541 08/31/23 0330 09/01/23 0335  NA 137   < > 134* 132* 134* 135  135 137 135  K 4.0   < > 4.2 4.4 4.0 4.5  4.5 3.9 4.0  CL 104   < > 102 100 98  --  102 98  CO2 18*   < > 24 27 26   --  26 28  GLUCOSE 258*   < > 227* 214* 177*  --  156* 138*  BUN 20   < > 32* 29* 34*  --  34* 30*  CREATININE 1.65*   < > 1.36* 1.26* 1.47*  --  1.43* 1.45*  CALCIUM 8.2*   < > 7.9* 8.3* 8.4*  --  7.8* 8.0*  MG 1.7   < > 2.2 2.2 2.2  --  2.2 2.3  PHOS 2.7  --   --   --   --   --   --   --    < > = values in this interval not displayed.   GFR: Estimated Creatinine Clearance: 33.6 mL/min (A) (by C-G formula based on SCr of 1.45 mg/dL (H)). Liver Function Tests: Recent Labs  Lab 08/28/23 0249 08/29/23 0443 08/30/23 0323 08/31/23 0330 09/01/23 0335  AST 151* 119* 106* 70* 68*  ALT 48* 57* 67* 50* 50*  ALKPHOS 46 49 60 48 58  BILITOT 0.6 1.1 0.9 1.0 1.2  PROT 5.9* 6.0* 6.3* 5.2* 5.6*  ALBUMIN 2.7* 2.7*  2.8* 2.2* 2.3*   Coagulation Profile: Recent Labs  Lab 08/25/23 1539  INR 1.2   CBG: Recent Labs  Lab 08/31/23 1101 08/31/23 1533 08/31/23 2015 09/01/23 0608 09/01/23 1048  GLUCAP 182* 190* 168* 108* 159*   Sepsis Labs: Recent Labs  Lab 08/25/23 1539  PROCALCITON 0.37    Recent Results (from the past 240 hour(s))  SARS Coronavirus 2 by RT PCR (hospital order, performed in [facility] hospital lab) *cepheid single result test* Anterior Nasal Swab     Status: Abnormal   Collection Time: 08/25/23  2:13 PM   Specimen: Anterior Nasal Swab  Result Value Ref Range Status   SARS Coronavirus 2 by RT PCR POSITIVE (A) NEGATIVE Final    Comment: Performed at [facility], [address], [place], [place] [address]  Urine Culture (for pregnant, neutropenic or urologic patients or patients with an indwelling urinary catheter)     Status: None   Collection Time: 08/29/23  1:14 PM   Specimen: Urine, Clean Catch  Result Value Ref Range Status   Specimen Description URINE, CLEAN CATCH  Final   Special Requests NONE  Final   Culture   Final    NO GROWTH Performed at [facility], [address] [place], [place] [address]    Report Status 08/30/2023 FINAL  Final  Culture, blood (Routine X 2) w Reflex to ID Panel     Status: None (Preliminary result)   Collection Time: 08/29/23  3:15 PM   Specimen: BLOOD LEFT HAND  Result Value Ref Range Status   Specimen Description BLOOD LEFT HAND  Final   Special Requests   Final    BOTTLES DRAWN AEROBIC AND ANAEROBIC Blood Culture adequate volume   Culture   Final    NO GROWTH 2 DAYS  Performed at [facility], [address] [place], [place] [address]    Report Status PENDING  Incomplete  Culture, blood (Routine X 2) w Reflex to ID Panel     Status: None (Preliminary result)   Collection Time: 08/29/23  4:12 PM   Specimen: BLOOD LEFT ARM  Result Value Ref Range Status   Specimen Description BLOOD LEFT ARM  Final    Special Requests   Final    BOTTLES DRAWN AEROBIC AND ANAEROBIC Blood Culture adequate volume   Culture   Final    NO GROWTH 2 DAYS Performed at [facility], [address], [place], [place] [address]    Report Status PENDING  Incomplete     Radiology Studies: CT Angio Chest Pulmonary Embolism (PE) W or WO Contrast  Result Date: 08/31/2023 CLINICAL DATA:  Pulmonary embolism (PE) suspected, high prob Dyspnea. EXAM: CT ANGIOGRAPHY CHEST WITH CONTRAST TECHNIQUE: Multidetector CT imaging of the chest was performed using the standard protocol during bolus administration of intravenous contrast. Multiplanar CT image reconstructions and MIPs were obtained to evaluate the vascular anatomy. RADIATION DOSE REDUCTION: This exam was performed according to the departmental dose-optimization program which includes automated exposure control, adjustment of the mA and/or kV according to patient size and/or use of iterative reconstruction technique. CONTRAST:  65mL OMNIPAQUE IOHEXOL 350 MG/ML SOLN COMPARISON:  Radiograph 08/29/2023 FINDINGS: Cardiovascular: There are no filling defects within the pulmonary arteries to suggest pulmonary embolus. Mild cardiomegaly post CABG. Calcification of native coronary arteries. Aortic atherosclerosis without aneurysm or acute aortic findings. No pericardial effusion. Mediastinum/Nodes: Wall thickening of the distal esophagus. Scattered small mediastinal lymph nodes not enlarged by size criteria. No thyroid nodule. Lungs/Pleura: Small bilateral pleural effusions. Extensive ground-glass and consolidative opacities throughout both lungs with septal thickening, right greater than left. No pneumothorax. No endobronchial lesion. Upper Abdomen: No acute findings.  Cholecystectomy. Musculoskeletal: Median sternotomy. Thoracic spondylosis with anterior spurring. There are no acute or suspicious osseous abnormalities. No chest wall soft tissue abnormalities. Review of the MIP images  confirms the above findings. IMPRESSION: 1. No pulmonary embolus. 2. Extensive ground-glass and consolidative opacities throughout both lungs with septal thickening, right greater than left. Findings may represent multifocal pneumonia or pulmonary edema. 3. Small bilateral pleural effusions. 4. Wall thickening of the distal esophagus, can be seen with reflux or esophagitis. 5. Cardiomegaly post CABG. Aortic Atherosclerosis (ICD10-I70.0). Electronically Signed   By: Narda Rutherford M.D.   On: 08/31/2023 16:51   CARDIAC CATHETERIZATION  Result Date: 08/30/2023 Images from the original result were not included. Coronary and bypass graft angiography 08/30/2023: LM: No significant disease LAD: Mid occluded Lcx: Proximally occluded Ramus: Proximally subtotally occluded RCA: Distally occluded, with mid severe disease LIMA-LAD: Patent, no significant disease SVG-PDA/PL: Patent, no significant disease SVG-ramus: Patent, no significant disease Right heart catheterization 08/30/2023: RA: 1 mmHg RV: 45/0 mmHg PA: 49/14 mmHg, mPAP 28 mmHg PCW: 10 mmHg CO: 8.4 L/min CI: 4.6 L/min/m2 Multivessel sever coronary artery disease Patent 3/3 grafts Compensated filling pressures Mild PH NSTEMI likely type 2 MI Manish Emiliano Dyer, MD   Scheduled Meds:  amLODipine  5 mg Oral Daily   arformoterol  15 mcg Nebulization BID   aspirin EC  81 mg Oral QPM   atorvastatin  40 mg Oral QPM   budesonide (PULMICORT) nebulizer solution  0.25 mg Nebulization BID   enoxaparin (LOVENOX) injection  40 mg Subcutaneous Q24H   insulin aspart  0-15 Units Subcutaneous TID WC   insulin  aspart  0-5 Units Subcutaneous QHS   insulin glargine-yfgn  10 Units Subcutaneous Daily   ipratropium-albuterol  3 mL Nebulization BID   metoprolol tartrate  50 mg Oral BID   pantoprazole  40 mg Oral Daily   [START ON 09/02/2023] predniSONE  40 mg Oral Daily   sodium chloride flush  3 mL Intravenous Q12H   sodium chloride flush  3 mL Intravenous Q12H    Continuous Infusions:   LOS: 7 days   Time spent: 35 minutes  Carollee Herter, DO  [facility]  09/01/2023, 2:01 PM

## 2023-09-01 NOTE — Progress Notes (Signed)
SATURATION QUALIFICATIONS: (This note is used to comply with regulatory documentation for home oxygen)  Patient Saturations on Room Air at Rest = 72%  Patient Saturations on Room Air while Ambulating = 72%  Patient Saturations on 4 Liters of oxygen while Ambulating = 90%  Please briefly explain why patient needs home oxygen: Desat when on room air

## 2023-09-01 NOTE — Plan of Care (Signed)
?  Problem: Nutrition: Goal: Adequate nutrition will be maintained Outcome: Progressing   Problem: Education: Goal: Knowledge of General Education information will improve Description: Including pain rating scale, medication(s)/side effects and non-pharmacologic comfort measures Outcome: Progressing   

## 2023-09-01 NOTE — TOC Progression Note (Signed)
Transition of Care [facility]) - Progression Note    Patient Details  Name: Thomas Gray MRN: [id number] Date of Birth: [birth year]  Transition of Care [facility]) CM/SW Contact  Leone Haven, RN Phone Number: 09/01/2023, 1:27 PM  Clinical Narrative:    NCM offered choice to patient for DME , ( neb machine and home oxygen), he states he does not have a preference.  NCM made referral to [facility] with [employer].  The neb machine and the home oxygen will be delivered to patient room and the concentrator will be set up at the patient home.         Expected Discharge Plan and Services                                               Social Determinants of Health (SDOH) Interventions SDOH Screenings   Food Insecurity: No Food Insecurity (08/25/2023)  Housing: Low Risk  (08/25/2023)  Transportation Needs: No Transportation Needs (08/25/2023)  Utilities: Not At Risk (08/25/2023)  Tobacco Use: Medium Risk (08/25/2023)    Readmission Risk Interventions     No data to display

## 2023-09-02 DIAGNOSIS — U071 COVID-19: Secondary | ICD-10-CM | POA: Diagnosis not present

## 2023-09-02 DIAGNOSIS — I2489 Other forms of acute ischemic heart disease: Secondary | ICD-10-CM | POA: Diagnosis not present

## 2023-09-02 DIAGNOSIS — J441 Chronic obstructive pulmonary disease with (acute) exacerbation: Secondary | ICD-10-CM | POA: Diagnosis not present

## 2023-09-02 DIAGNOSIS — J9601 Acute respiratory failure with hypoxia: Secondary | ICD-10-CM | POA: Diagnosis not present

## 2023-09-02 LAB — GLUCOSE, CAPILLARY: Glucose-Capillary: 105 mg/dL — ABNORMAL HIGH (ref 70–99)

## 2023-09-02 MED ORDER — PREDNISONE 10 MG PO TABS: ORAL_TABLET | ORAL | 0 refills | Status: AC

## 2023-09-02 MED ORDER — BUDESONIDE 0.25 MG/2ML IN SUSP: 0.2500 mg | Freq: Two times a day (BID) | RESPIRATORY_TRACT | 0 refills | Status: AC

## 2023-09-02 MED ORDER — IPRATROPIUM-ALBUTEROL 0.5-2.5 (3) MG/3ML IN SOLN: 3.0000 mL | Freq: Two times a day (BID) | RESPIRATORY_TRACT | 0 refills | Status: AC

## 2023-09-02 NOTE — Discharge Summary (Signed)
[facility] Physician Discharge Summary   Patient name: Thomas Gray  Admit date:     08/25/2023  Discharge date: 09/02/2023  Attending Physician: Starleen Arms [[id number]]  Discharge Physician: Carollee Herter   PCP: Olive Bass, MD  Admitted From: Home  Disposition:  Home  Recommendations for Outpatient Follow-up:  Follow up with PCP in 1-2 weeks Follow up with outpatient pulmonology 09/06/2023 3:30 PM   Home Health:No. Pt declined any home health services. Equipment/Devices: Home nebulizer machine Oxygen 4 L/min  Discharge Condition:Stable CODE STATUS:FULL Diet recommendation: Diabetic Fluid Restriction: None  Hospital Summary: HPI: Thomas Gray  is a 82 y.o. male, with past medical history of heavy smoking in the past, quit 13 years ago, COPD, not home oxygen, history of CAD status post CABG, carotid artery disease, discoid lupus erythematosus, hypertension, hyperlipidemia and GERD. -Patient was sent to [facility] secondary to complaints of cough, shortness of breath, patient was transferred to [facility] due to concern of NSTEMI, patient reports his son was diagnosed with COVID last week, he was exposed to him, reports dyspnea over the last few days, cough, he denies fever, chills, chest pain, no nausea or vomiting, but reports generalized weakness, frail, enterography ED initially patient requiring 8 L oxygen, but currently upon my evaluation he is on 2 L oxygen, by reviewing records from [place] it does appear he had elevated troponins at 0.66, (normal less than 0.04, labs were significant for sodium of 138, potassium of 4.2, carbon dioxide of 23, anion gap of 14, creatinine of 1.2, GFR of 58, glucose of 207, lactic acid elevated at 3.7, BNP elevated at 2590 (normal up to 1800), chest x-ray significant for vascular congestion, patient was started on heparin gtt. and transfer was requested to [facility] for possible NSTEMI, cardiology accepted the patient and  recommended admission to the hospitalist service due to multiple comorbidities.    Significant Events: Admitted 08/25/2023 for covid infection, respiratory failure with hypoxia and elevated troponin   Significant Labs: Admitting troponin 2532->3072->11782  Significant Imaging Studies: 08-26-2023 echo LVEF 60% 2023/09/19 LHC shows LM: No significant disease LAD: Mid occluded. Lcx: Proximally occluded. Ramus: Proximally subtotally occluded. RCA: Distally occluded, with mid severe disease. LIMA-LAD: Patent, no significant disease. SVG-PDA/PL: Patent, no significant disease. SVG-ramus: Patent, no significant disease LHC shows Multivessel sever coronary artery disease. Patent 3/3 grafts. Compensated filling pressures. Mild PH 10/07-2023 CTPA negative for PE.  Antibiotic Therapy: Anti-infectives (From admission, onward)    None       Procedures: 2023/09/19 LHC/RHC  Consultants: Cardiology   Hospital Course by Problem: * COVID-19 virus infection Admitted for covid and respiratory failure. Did not normally use home O2 prior to this admission. Pt states he has not had covid infection before.  Still on 4 L/min. Has qualified for home O2 @  4 L/min.  CTPA negative for PE  Will reduce prednisone to 40 mg daily. 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  Have arranged for home O2 @ 4 L/min and home nebulizer machine.  Acute respiratory failure with hypoxia (HCC) Likely due to COVID-19.  has been on oxygen since admission.  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  He will need long-term oxygen.  PE ruled out by CTPA.  Demand ischemia of myocardium (HCC) Likely due to hypoxia related to covid infection. LHC yesterday shows patent 3 of 3 grafts.  COPD exacerbation (  HCC) Was initially treated with IV steroids.  Now back on prednisone p.o.  will reduce prednisone to 40 mg daily. Continue with nebs as needed. TOC  to arrange home neb machine.  Type 2 diabetes mellitus without complication, without long-term current use of insulin (HCC) Remains on Lantus and NovoLog.  Hemoglobin A1c during this admission 7.3%. Pt will be discharged to home with his same home regimen of metformin and [place]. He should f/u with PCP regarding his outpatient DM treatment.  Peripheral vascular disease (HCC) Stable.  CKD stage 3a, GFR 45-59 ml/min (HCC) - baseline SCr 1.3-1.6 Currently at baseline.  Hyperlipidemia associated with type 2 diabetes mellitus (HCC) Continue with Lipitor 40 mg daily.  Essential hypertension, benign Stable.  On Lopressor 50 mg twice daily, Norvasc 5 mg daily.  S/P CABG x 4 Patent grafts on cath yesterday.  CAD, multiple vessel Stable.    Discharge Diagnoses:  Principal Problem:   COVID-19 virus infection Active Problems:   Acute respiratory failure with hypoxia (HCC)   COPD exacerbation (HCC)   Demand ischemia of myocardium (HCC)   Carotid artery disease (HCC)   CAD, multiple vessel   S/P CABG x 4   Essential hypertension, benign   Hyperlipidemia associated with type 2 diabetes mellitus (HCC)   CKD stage 3a, GFR 45-59 ml/min (HCC) - baseline SCr 1.3-1.6   Peripheral vascular disease (HCC)   Type 2 diabetes mellitus without complication, without long-term current use of insulin (HCC)   Discharge Instructions  Discharge Instructions     Call MD for:  difficulty breathing, headache or visual disturbances   Complete by: As directed    Call MD for:  extreme fatigue   Complete by: As directed    Call MD for:  persistant dizziness or light-headedness   Complete by: As directed    Call MD for:  persistant nausea and vomiting   Complete by: As directed    Call MD for:  redness, tenderness, or signs of infection (pain, swelling, redness, odor or green/yellow discharge around incision site)   Complete by: As directed    Call MD for:  temperature >100.4   Complete by: As  directed    Diet - low sodium heart healthy   Complete by: As directed    Discharge instructions   Complete by: As directed    1. Followup with primary care physician in 1-2 weeks following discharge 2. Followup with pulmonology as scheduled on 09/06/2023 3:30 PM 3. Continue to use nebulizer machine for shortness of breath 4. Finish all of your prednisone taper unless instructed not to by pulmonology.   Increase activity slowly   Complete by: As directed       Allergies as of 09/02/2023   No Known Allergies      Medication List     TAKE these medications    amLODipine 5 MG tablet Commonly known as: NORVASC TAKE 1 TABLET BY MOUTH EVERY DAY   aspirin EC 81 MG tablet Take 81 mg by mouth every evening.   atorvastatin 40 MG tablet Commonly known as: LIPITOR Take 1 tablet (40 mg total) by mouth every evening.   budesonide 0.25 MG/2ML nebulizer solution Commonly known as: PULMICORT Take 2 mLs (0.25 mg total) by nebulization 2 (two) times daily.   cetirizine 10 MG tablet Commonly known as: ZYRTEC Take 10 mg by mouth daily as needed for allergies or rhinitis.   Farxiga 10 MG Tabs tablet Generic drug: dapagliflozin propanediol Take 10 mg by mouth every  morning.   ipratropium-albuterol 0.5-2.5 (3) MG/3ML Soln Commonly known as: DUONEB Take 3 mLs by nebulization 2 (two) times daily. And every 4 hours as needed for SOB or wheezing   losartan 50 MG tablet Commonly known as: COZAAR Take 1 tablet (50 mg total) by mouth daily. Please be sure to make your appointment in December for further refills.   metFORMIN 500 MG tablet Commonly known as: GLUCOPHAGE Take 500 mg by mouth 2 (two) times daily with a meal.   metoprolol tartrate 50 MG tablet Commonly known as: LOPRESSOR TAKE 1 TABLET BY MOUTH TWICE A DAY   omeprazole 40 MG capsule Commonly known as: PRILOSEC TAKE 1 CAPSULE (40 MG TOTAL) BY MOUTH DAILY. KEEP OV.   [facility] Colon Health Caps Take 1 capsule by mouth  daily.   predniSONE 10 MG tablet Commonly known as: DELTASONE Take 4 tablets (40 mg total) by mouth daily for 2 days, THEN 3 tablets (30 mg total) daily for 2 days, THEN 2 tablets (20 mg total) daily for 2 days, THEN 1 tablet (10 mg total) daily for 2 days. Start taking on: September 02, 2023               Durable Medical Equipment  (From admission, onward)           Start     Ordered   09/01/23 1305  For home use only DME Nebulizer machine  Once       Question Answer Comment  Patient needs a nebulizer to treat with the following condition COVID-19 virus infection   Length of Need Lifetime      09/01/23 1304   09/01/23 1300  For home use only DME oxygen  Once       Question Answer Comment  Length of Need 12 Months   Mode or (Route) Nasal cannula   Liters per Minute 4   Frequency Continuous (stationary and portable oxygen unit needed)   Oxygen conserving device Yes   Oxygen delivery system Gas      09/01/23 1259            Follow-up Information     [facility] Follow up.   Why: home oxygen, neb machine Contact information: [phone]        [facility] Follow up.   Specialty: Pulmonology Why: GO:10/13/2023 AT 11:15AM (EARLIEST) Contact information: [address] [place] [address] [phone]               No Known Allergies  Discharge Exam: Vitals:   09/02/23 0601 09/02/23 0801  BP: (!) 138/56 (!) 163/60  Pulse: 74 77  Resp: 17 18  Temp: 97.8 F (36.6 C) 97.7 F (36.5 C)  SpO2: 100% 94%    Physical Exam Vitals and nursing note reviewed.  Constitutional:      General: He is not in acute distress.    Appearance: He is not toxic-appearing or diaphoretic.  HENT:     Head: Normocephalic and atraumatic.     Nose: Nose normal.  Eyes:     General: No scleral icterus. Cardiovascular:     Rate and Rhythm: Normal rate and regular rhythm.  Pulmonary:     Effort:  Pulmonary effort is normal. No respiratory distress.     Breath sounds: No wheezing.     Comments: Dry crackles at lung bases Abdominal:     General: Abdomen is flat. Bowel sounds are normal. There is no  distension.     Palpations: Abdomen is soft.  Musculoskeletal:     Right lower leg: No edema.     Left lower leg: No edema.  Skin:    General: Skin is warm and dry.     Capillary Refill: Capillary refill takes less than 2 seconds.  Neurological:     General: No focal deficit present.     Mental Status: He is alert and oriented to person, place, and time.     The results of significant diagnostics from this hospitalization (including imaging, microbiology, ancillary and laboratory) are listed below for reference.    Microbiology: Recent Results (from the past 240 hour(s))  SARS Coronavirus 2 by RT PCR (hospital order, performed in [facility] hospital lab) *cepheid single result test* Anterior Nasal Swab     Status: Abnormal   Collection Time: 08/25/23  2:13 PM   Specimen: Anterior Nasal Swab  Result Value Ref Range Status   SARS Coronavirus 2 by RT PCR POSITIVE (A) NEGATIVE Final    Comment: Performed at [facility], [address] [place], [place] [address]  Urine Culture (for pregnant, neutropenic or urologic patients or patients with an indwelling urinary catheter)     Status: None   Collection Time: 08/29/23  1:14 PM   Specimen: Urine, Clean Catch  Result Value Ref Range Status   Specimen Description URINE, CLEAN CATCH  Final   Special Requests NONE  Final   Culture   Final    NO GROWTH Performed at [facility], [address], [place], [place] [address]    Report Status 08/30/2023 FINAL  Final  Culture, blood (Routine X 2) w Reflex to ID Panel     Status: None (Preliminary result)   Collection Time: 08/29/23  3:15 PM   Specimen: BLOOD LEFT HAND  Result Value Ref Range Status   Specimen Description BLOOD LEFT HAND  Final   Special Requests   Final     BOTTLES DRAWN AEROBIC AND ANAEROBIC Blood Culture adequate volume   Culture   Final    NO GROWTH 3 DAYS Performed at [facility], [address] [place], [place] [address]    Report Status PENDING  Incomplete  Culture, blood (Routine X 2) w Reflex to ID Panel     Status: None (Preliminary result)   Collection Time: 08/29/23  4:12 PM   Specimen: BLOOD LEFT ARM  Result Value Ref Range Status   Specimen Description BLOOD LEFT ARM  Final   Special Requests   Final    BOTTLES DRAWN AEROBIC AND ANAEROBIC Blood Culture adequate volume   Culture   Final    NO GROWTH 3 DAYS Performed at [facility], [address] [place], [place] [address]    Report Status PENDING  Incomplete     Labs: BNP (last 3 results) Recent Labs    09/01/23 0335  BNP 294.2*   Basic Metabolic Panel: Recent Labs  Lab 08/28/23 0249 08/29/23 0443 08/30/23 0323 08/30/23 1541 08/31/23 0330 09/01/23 0335  NA 134* 132* 134* 135  135 137 135  K 4.2 4.4 4.0 4.5  4.5 3.9 4.0  CL 102 100 98  --  102 98  CO2 24 27 26   --  26 28  GLUCOSE 227* 214* 177*  --  156* 138*  BUN 32* 29* 34*  --  34* 30*  CREATININE 1.36* 1.26* 1.47*  --  1.43* 1.45*  CALCIUM 7.9* 8.3* 8.4*  --  7.8* 8.0*  MG 2.2 2.2 2.2  --  2.2 2.3   Liver Function Tests: Recent Labs  Lab 08/28/23 0249 08/29/23 0443 08/30/23 0323 08/31/23 0330 09/01/23 0335  AST 151* 119* 106* 70* 68*  ALT 48* 57* 67* 50* 50*  ALKPHOS 46 49 60 48 58  BILITOT 0.6 1.1 0.9 1.0 1.2  PROT 5.9* 6.0* 6.3* 5.2* 5.6*  ALBUMIN 2.7* 2.7* 2.8* 2.2* 2.3*   CBC: Recent Labs  Lab 08/27/23 0318 08/28/23 0249 08/29/23 0443 08/30/23 0323 08/30/23 1541 08/31/23 0330  WBC 17.7* 10.9* 13.1* 13.3*  --  11.2*  NEUTROABS  --   --   --   --   --  10.5*  HGB 8.2* 7.7* 8.7* 8.5* 8.8*  8.8* 8.1*  HCT 27.0* 24.8* 28.1* 27.2* 26.0*  26.0* 25.3*  MCV 83.9 81.3 82.4 80.7  --  82.1  PLT 268 268 272 286  --  229   BNP: Recent Labs  Lab 09/01/23 0335  BNP  294.2*   CBG: Recent Labs  Lab 09/01/23 0608 09/01/23 1048 09/01/23 1536 09/01/23 2058 09/02/23 0559  GLUCAP 108* 159* 175* 254* 105*   Urinalysis    Component Value Date/Time   COLORURINE STRAW (A) 08/29/2023 0114   APPEARANCEUR CLEAR 08/29/2023 0114   LABSPEC 1.005 08/29/2023 0114   PHURINE 5.0 08/29/2023 0114   GLUCOSEU NEGATIVE 08/29/2023 0114   HGBUR MODERATE (A) 08/29/2023 0114   BILIRUBINUR NEGATIVE 08/29/2023 0114   KETONESUR NEGATIVE 08/29/2023 0114   PROTEINUR 100 (A) 08/29/2023 0114   UROBILINOGEN 1.0 08/07/2010 0024   NITRITE NEGATIVE 08/29/2023 0114   LEUKOCYTESUR NEGATIVE 08/29/2023 0114   Sepsis Labs Recent Labs  Lab 08/28/23 0249 08/29/23 0443 08/30/23 0323 08/31/23 0330  WBC 10.9* 13.1* 13.3* 11.2*   Microbiology Recent Results (from the past 240 hour(s))  SARS Coronavirus 2 by RT PCR (hospital order, performed in [facility] hospital lab) *cepheid single result test* Anterior Nasal Swab     Status: Abnormal   Collection Time: 08/25/23  2:13 PM   Specimen: Anterior Nasal Swab  Result Value Ref Range Status   SARS Coronavirus 2 by RT PCR POSITIVE (A) NEGATIVE Final    Comment: Performed at [facility], [address] [place], [place] [address]  Urine Culture (for pregnant, neutropenic or urologic patients or patients with an indwelling urinary catheter)     Status: None   Collection Time: 08/29/23  1:14 PM   Specimen: Urine, Clean Catch  Result Value Ref Range Status   Specimen Description URINE, CLEAN CATCH  Final   Special Requests NONE  Final   Culture   Final    NO GROWTH Performed at [facility], [address] [place], [place] [address]    Report Status 08/30/2023 FINAL  Final  Culture, blood (Routine X 2) w Reflex to ID Panel     Status: None (Preliminary result)   Collection Time: 08/29/23  3:15 PM   Specimen: BLOOD LEFT HAND  Result Value Ref Range Status   Specimen Description BLOOD LEFT HAND  Final   Special  Requests   Final    BOTTLES DRAWN AEROBIC AND ANAEROBIC Blood Culture adequate volume   Culture   Final    NO GROWTH 3 DAYS Performed at [facility], [address], [place], [place] [address]    Report Status PENDING  Incomplete  Culture, blood (Routine X 2) w Reflex to ID Panel     Status: None (  Preliminary result)   Collection Time: 08/29/23  4:12 PM   Specimen: BLOOD LEFT ARM  Result Value Ref Range Status   Specimen Description BLOOD LEFT ARM  Final   Special Requests   Final    BOTTLES DRAWN AEROBIC AND ANAEROBIC Blood Culture adequate volume   Culture   Final    NO GROWTH 3 DAYS Performed at [facility], [address] [place], [place] [address]    Report Status PENDING  Incomplete    Procedures/Studies: CT Angio Chest Pulmonary Embolism (PE) W or WO Contrast  Result Date: 08/31/2023 CLINICAL DATA:  Pulmonary embolism (PE) suspected, high prob Dyspnea. EXAM: CT ANGIOGRAPHY CHEST WITH CONTRAST TECHNIQUE: Multidetector CT imaging of the chest was performed using the standard protocol during bolus administration of intravenous contrast. Multiplanar CT image reconstructions and MIPs were obtained to evaluate the vascular anatomy. RADIATION DOSE REDUCTION: This exam was performed according to the departmental dose-optimization program which includes automated exposure control, adjustment of the mA and/or kV according to patient size and/or use of iterative reconstruction technique. CONTRAST:  65mL OMNIPAQUE IOHEXOL 350 MG/ML SOLN COMPARISON:  Radiograph 08/29/2023 FINDINGS: Cardiovascular: There are no filling defects within the pulmonary arteries to suggest pulmonary embolus. Mild cardiomegaly post CABG. Calcification of native coronary arteries. Aortic atherosclerosis without aneurysm or acute aortic findings. No pericardial effusion. Mediastinum/Nodes: Wall thickening of the distal esophagus. Scattered small mediastinal lymph nodes not enlarged by size criteria. No thyroid  nodule. Lungs/Pleura: Small bilateral pleural effusions. Extensive ground-glass and consolidative opacities throughout both lungs with septal thickening, right greater than left. No pneumothorax. No endobronchial lesion. Upper Abdomen: No acute findings.  Cholecystectomy. Musculoskeletal: Median sternotomy. Thoracic spondylosis with anterior spurring. There are no acute or suspicious osseous abnormalities. No chest wall soft tissue abnormalities. Review of the MIP images confirms the above findings. IMPRESSION: 1. No pulmonary embolus. 2. Extensive ground-glass and consolidative opacities throughout both lungs with septal thickening, right greater than left. Findings may represent multifocal pneumonia or pulmonary edema. 3. Small bilateral pleural effusions. 4. Wall thickening of the distal esophagus, can be seen with reflux or esophagitis. 5. Cardiomegaly post CABG. Aortic Atherosclerosis (ICD10-I70.0). Electronically Signed   By: Narda Rutherford M.D.   On: 08/31/2023 16:51   CARDIAC CATHETERIZATION  Result Date: 08/30/2023 Images from the original result were not included. Coronary and bypass graft angiography 08/30/2023: LM: No significant disease LAD: Mid occluded Lcx: Proximally occluded Ramus: Proximally subtotally occluded RCA: Distally occluded, with mid severe disease LIMA-LAD: Patent, no significant disease SVG-PDA/PL: Patent, no significant disease SVG-ramus: Patent, no significant disease Right heart catheterization 08/30/2023: RA: 1 mmHg RV: 45/0 mmHg PA: 49/14 mmHg, mPAP 28 mmHg PCW: 10 mmHg CO: 8.4 L/min CI: 4.6 L/min/m2 Multivessel sever coronary artery disease Patent 3/3 grafts Compensated filling pressures Mild PH NSTEMI likely type 2 MI Elder Negus, MD  DG CHEST PORT 1 VIEW  Result Date: 08/29/2023 CLINICAL DATA:  Dyspnea. EXAM: PORTABLE CHEST 1 VIEW COMPARISON:  August 25, 2023. FINDINGS: Stable cardiomediastinal silhouette. Status post coronary artery bypass graft. Significantly  increased bilateral lung opacities are noted concerning for worsening pneumonia or edema. Bony thorax is unremarkable. IMPRESSION: Significant increased bilateral lung opacities are noted concerning for worsening pneumonia or edema. Electronically Signed   By: Lupita Raider M.D.   On: 08/29/2023 13:40   ECHOCARDIOGRAM COMPLETE  Result Date: 08/26/2023    ECHOCARDIOGRAM REPORT   Patient Name:   Thomas Gray Date of Exam: 08/26/2023 Medical Rec #:  [id number]   Height:       65.0 in Accession #:    [id number]  Weight:       162.3 lb Date of Birth:  10-Jan-1941   BSA:          1.810 m Patient Age:    89 years    BP:           145/73 mmHg Patient Gender: M           HR:           87 bpm. Exam Location:  Inpatient Procedure: 2D Echo, Cardiac Doppler and Color Doppler Indications:    NSTEMI  History:        Patient has prior history of Echocardiogram examinations, most                 recent 05/26/2020. CAD, Prior CABG, COPD and COVID. Chronic kidney                 disease; Risk Factors:Diabetes.  Sonographer:    Delcie Roch RDCS Referring Phys: [id number]  Sonographer Comments: Image acquisition challenging due to respiratory motion. IMPRESSIONS  1. Left ventricular ejection fraction, by estimation, is 55 to 60%. The left ventricle has normal function. The left ventricle has no regional wall motion abnormalities. There is mild concentric left ventricular hypertrophy. Left ventricular diastolic parameters are indeterminate.  2. Right ventricular systolic function is normal. The right ventricular size is normal. There is moderately elevated pulmonary artery systolic pressure.  3. Left atrial size was mildly dilated.  4. The mitral valve is normal in structure. Mild to moderate mitral valve regurgitation. No evidence of mitral stenosis.  5. Tricuspid valve regurgitation is moderate.  6. The aortic valve is normal in structure. Aortic valve regurgitation is trivial. Aortic valve sclerosis is present, with  no evidence of aortic valve stenosis.  7. The inferior vena cava is normal in size with greater than 50% respiratory variability, suggesting right atrial pressure of 3 mmHg. FINDINGS  Left Ventricle: Left ventricular ejection fraction, by estimation, is 55 to 60%. The left ventricle has normal function. The left ventricle has no regional wall motion abnormalities. The left ventricular internal cavity size was normal in size. There is  mild concentric left ventricular hypertrophy. Left ventricular diastolic parameters are indeterminate. Right Ventricle: The right ventricular size is normal. No increase in right ventricular wall thickness. Right ventricular systolic function is normal. There is moderately elevated pulmonary artery systolic pressure. The tricuspid regurgitant velocity is 3.43 m/s, and with an assumed right atrial pressure of 3 mmHg, the estimated right ventricular systolic pressure is 50.1 mmHg. Left Atrium: Left atrial size was mildly dilated. Right Atrium: Right atrial size was normal in size. Pericardium: There is no evidence of pericardial effusion. Mitral Valve: The mitral valve is normal in structure. Mild to moderate mitral valve regurgitation. No evidence of mitral valve stenosis. Tricuspid Valve: The tricuspid valve is normal in structure. Tricuspid valve regurgitation is moderate . No evidence of tricuspid stenosis. Aortic Valve: The aortic valve is normal in structure. Aortic valve regurgitation is trivial. Aortic valve sclerosis is present, with no evidence of aortic valve stenosis. Pulmonic Valve: The pulmonic valve was normal in structure. Pulmonic valve regurgitation is not visualized. No evidence of pulmonic stenosis. Aorta: The aortic root is normal in size and structure. Venous: The inferior vena cava is normal in size with greater than 50% respiratory variability, suggesting right atrial pressure of 3 mmHg. IAS/Shunts:  No atrial level shunt detected by color flow Doppler. Additional  Comments: A device lead is visualized.  LEFT VENTRICLE PLAX 2D LVIDd:         3.80 cm   Diastology LVIDs:         2.30 cm   LV e' medial:    5.55 cm/s LV PW:         1.20 cm   LV E/e' medial:  20.0 LV IVS:        1.20 cm   LV e' lateral:   6.53 cm/s LVOT diam:     1.80 cm   LV E/e' lateral: 17.0 LV SV:         45 LV SV Index:   25 LVOT Area:     2.54 cm  RIGHT VENTRICLE            IVC RV Basal diam:  2.90 cm    IVC diam: 2.00 cm RV S prime:     9.46 cm/s TAPSE (M-mode): 1.4 cm LEFT ATRIUM             Index        RIGHT ATRIUM           Index LA diam:        4.40 cm 2.43 cm/m   RA Area:     14.50 cm LA Vol (A2C):   46.7 ml 25.80 ml/m  RA Volume:   32.90 ml  18.18 ml/m LA Vol (A4C):   67.6 ml 37.35 ml/m LA Biplane Vol: 57.6 ml 31.82 ml/m  AORTIC VALVE LVOT Vmax:   88.30 cm/s LVOT Vmean:  60.600 cm/s LVOT VTI:    0.176 m  AORTA Ao Root diam: 3.10 cm Ao Asc diam:  3.30 cm MITRAL VALVE                TRICUSPID VALVE MV Area (PHT): 4.31 cm     TR Peak grad:   47.1 mmHg MV Decel Time: 176 msec     TR Vmax:        343.00 cm/s MV E velocity: 111.00 cm/s MV A velocity: 57.50 cm/s   SHUNTS MV E/A ratio:  1.93         Systemic VTI:  0.18 m                             Systemic Diam: 1.80 cm Kardie Tobb DO Electronically signed by Thomasene Ripple DO Signature Date/Time: 08/26/2023/1:38:07 PM    Final    DG Chest Port 1 View  Result Date: 08/25/2023 CLINICAL DATA:  82 year old male with respiratory failure, hypoxia. EXAM: PORTABLE CHEST 1 VIEW COMPARISON:  Portable chest 08/25/2023 and earlier. FINDINGS: Portable AP semi upright view at 1438 hours. Mildly rotated to the right now. Chronic CABG. Calcified aortic atherosclerosis. Other mediastinal contours are within normal limits. Mildly improved lung volumes. Regressed pulmonary interstitial opacity bilaterally. Currently no pneumothorax, pleural effusion, acute or confluent pulmonary opacity identified. No acute osseous abnormality identified. Negative visible bowel gas.  IMPRESSION: 1. Resolved pulmonary interstitial opacity since yesterday, favor resolved interstitial edema. 2. Chronic CABG.  No new cardiopulmonary abnormality. Electronically Signed   By: Odessa Fleming M.D.   On: 08/25/2023 16:44    Time coordinating discharge: 45 mins  SIGNED:  Carollee Herter, DO [facility] 09/02/23, 8:35 AM

## 2023-09-02 NOTE — Care Management Important Message (Signed)
Important Message  Patient Details  Name: Thomas Gray MRN: [id number] Date of Birth: [birth year]   Important Message Given:  Yes - Medicare IM Patient left prior to IM delivery will mail a copy for the IM to the patient home address.      Teofila Bowery 09/02/2023, 2:04 PM

## 2023-09-02 NOTE — Progress Notes (Signed)
Pt has orders to be discharged. Discharge instructions given and pt has no additional questions at this time. Medication regimen reviewed and pt educated. Pt verbalized understanding and has no additional questions. Telemetry box removed. IV removed and site in good condition. Pt stable and waiting for transportation. 

## 2023-09-02 NOTE — TOC Transition Note (Signed)
Transition of Care [facility]) - CM/SW Discharge Note   Patient Details  Name: Thomas Gray MRN: [id number] Date of Birth: 02-25-1941  Transition of Care [facility]) CM/SW Contact:  Leone Haven, RN Phone Number: 09/02/2023, 8:52 AM   Clinical Narrative:     For dc today, he has oxygen and neb machine  at the bedside, he states he will call his son now to come transport him home.  Meds sent to local pharmacy.   Final next level of care: Home/Self Care Barriers to Discharge: No Barriers Identified   Patient Goals and CMS Choice CMS Medicare.gov Compare Post Acute Care list provided to:: Patient Choice offered to / list presented to : Patient  Discharge Placement                         Discharge Plan and Services Additional resources added to the After Visit Summary for                  DME Arranged: Oxygen, Nebulizer machine DME Agency: [employer] Date DME Agency Contacted: 09/01/23 Time DME Agency Contacted: 1000 Representative spoke with at DME Agency: Vaughan Basta HH Arranged: NA          Social Determinants of Health (SDOH) Interventions SDOH Screenings   Food Insecurity: No Food Insecurity (08/25/2023)  Housing: Low Risk  (08/25/2023)  Transportation Needs: No Transportation Needs (08/25/2023)  Utilities: Not At Risk (08/25/2023)  Tobacco Use: Medium Risk (08/25/2023)     Readmission Risk Interventions     No data to display

## 2023-09-02 NOTE — Plan of Care (Signed)
Discharging to home

## 2023-09-02 NOTE — Progress Notes (Signed)
Occupational Therapy Treatment Patient Details Name: Thomas Gray MRN: [id number] DOB: 22-Mar-1941 Today's Date: 09/02/2023   History of present illness Pt presenting 10/03 from [facility] COVID, respiratory failure and elevated troponin. CXR demonstrates pulmonary edema. EKG with sinus tachy and first degree AV block. PMH significant for COPD, CAD s/p CABG, discoid lupus erythematosus, HTN, HLD, GERD, history of smoking.   OT comments  Pt. Seen for skilled OT treatment session.  Pt. In preparation for d/c home but agreeable to meet with me. Provided additional copy of energy conservation handouts.  Reviewed examples of how to integrate into use during adl completion.  Pt. Reports he has reviewed this previously but was receptive to additional education and review.        If plan is discharge home, recommend the following:  Other (comment)   Equipment Recommendations  None recommended by OT    Recommendations for Other Services      Precautions / Restrictions Precautions Precautions: None       Mobility Bed Mobility               General bed mobility comments: EOB on arrival and departure    Transfers                         Balance                                           ADL either performed or assessed with clinical judgement   ADL                                         General ADL Comments: provided energy conservation handouts and reviewed examples with pt., pt. reports he has reviewed these previously but was receptive to additional education and review.  d/c set for today, assisted pt. with preparation of outfit for home.  required set up for shirt to ensure he did not activate bleeding again of L forearm.  min a to get heel portion of shoe on    Extremity/Trunk Assessment              Vision       Perception     Praxis      Cognition Arousal: Alert Behavior During Therapy: WFL for tasks  assessed/performed Overall Cognitive Status: Within Functional Limits for tasks assessed                                          Exercises      Shoulder Instructions       General Comments      Pertinent Vitals/ Pain       Pain Assessment Pain Assessment: No/denies pain  Home Living                                          Prior Functioning/Environment              Frequency  Other (comment)        Progress Toward Goals  OT Goals(current goals can  now be found in the care plan section)  Progress towards OT goals: Progressing toward goals     Plan      Co-evaluation                 AM-PAC OT "6 Clicks" Daily Activity     Outcome Measure   Help from another person eating meals?: None Help from another person taking care of personal grooming?: None Help from another person toileting, which includes using toliet, bedpan, or urinal?: None Help from another person bathing (including washing, rinsing, drying)?: None Help from another person to put on and taking off regular upper body clothing?: None Help from another person to put on and taking off regular lower body clothing?: None 6 Click Score: 24    End of Session    OT Visit Diagnosis: Other (comment)   Activity Tolerance Patient tolerated treatment well   Patient Left with call bell/phone within reach;Other (comment) (seated eob)   Nurse Communication Other (comment) (alerted rn L forearm bleeding through bandage upon arrival into room. she came in and redressed the area)        Time: 0936-0950 OT Time Calculation (min): 14 min  Charges: OT General Charges $OT Visit: 1 Visit OT Treatments $Self Care/Home Management : 8-22 mins  Boneta Lucks, COTA/L Acute Rehabilitation [phone]   Alessandra Bevels Lorraine-COTA/L 09/02/2023, 11:29 AM

## 2023-09-03 LAB — CULTURE, BLOOD (ROUTINE X 2)
Culture: NO GROWTH
Culture: NO GROWTH
Special Requests: ADEQUATE
Special Requests: ADEQUATE

## 2023-09-05 ENCOUNTER — Telehealth: Payer: Self-pay | Admitting: Pulmonary Disease

## 2023-09-05 NOTE — Telephone Encounter (Signed)
Patient states pharmacy needs diagnosis code for Duoneb and Pulmicort. [facility]acy is [facility] [place]. Patient phone number is [phone].

## 2023-09-06 ENCOUNTER — Ambulatory Visit (INDEPENDENT_AMBULATORY_CARE_PROVIDER_SITE_OTHER): Payer: Medicare Other | Admitting: Pulmonary Disease

## 2023-09-06 ENCOUNTER — Encounter: Payer: Self-pay | Admitting: Pulmonary Disease

## 2023-09-06 ENCOUNTER — Inpatient Hospital Stay: Payer: Medicare Other | Admitting: Pulmonary Disease

## 2023-09-06 ENCOUNTER — Ambulatory Visit: Payer: Medicare Other

## 2023-09-06 VITALS — BP 160/60 | HR 74 | Ht 65.0 in | Wt 164.4 lb

## 2023-09-06 DIAGNOSIS — R9389 Abnormal findings on diagnostic imaging of other specified body structures: Secondary | ICD-10-CM | POA: Diagnosis not present

## 2023-09-06 NOTE — Patient Instructions (Signed)
I will see you in about 6 weeks  We will get a chest x-ray today  Continue activity as tolerated  Call us with significant concerns  I think it is okay for you to go ahead with your shots

## 2023-09-06 NOTE — Progress Notes (Signed)
Thomas Gray    [id number]    02/25/1941  Primary Care Physician:Dough, Doris Cheadle, MD  Referring Physician: Olive Bass, MD [address] [place],  [place] [address]  Chief complaint:   Patient being seen for follow-up   HPI:  Was recently hospitalized for respiratory failure, COVID infection Hypoxia with elevated troponin  There was significant elevation of his troponin, had a cardiac catheterization showing no significant obstruction of his coronaries.  S/p CABG x 4 previously-stents were patent during recent catheterization Past smoking history  Will exposed to his son who had had COVID Is vaccinated has had 5 shots previously  Was discharged on home oxygen but has been checking his oxygen frequently at home and saturations run about 95%  Denies any chest pains or chest discomfort Activity level has come back to normal  Was not having any respiratory complaint on 0.2 recent infection  Has a history of type 2 diabetes, history of obstructive lung disease, peripheral vascular disease, hyperlipidemia, hypertension  Able to tolerate activities without limitations  Outpatient Encounter Medications as of 09/06/2023  Medication Sig   amLODipine (NORVASC) 5 MG tablet TAKE 1 TABLET BY MOUTH EVERY DAY   aspirin EC 81 MG tablet Take 81 mg by mouth every evening.   atorvastatin (LIPITOR) 40 MG tablet Take 1 tablet (40 mg total) by mouth every evening.   budesonide (PULMICORT) 0.25 MG/2ML nebulizer solution Take 2 mLs (0.25 mg total) by nebulization 2 (two) times daily.   cetirizine (ZYRTEC) 10 MG tablet Take 10 mg by mouth daily as needed for allergies or rhinitis.   FARXIGA 10 MG TABS tablet Take 10 mg by mouth every morning.   losartan (COZAAR) 50 MG tablet Take 1 tablet (50 mg total) by mouth daily. Please be sure to make your appointment in December for further refills.   metFORMIN (GLUCOPHAGE) 500 MG tablet Take 500 mg by mouth 2 (two) times daily with a meal.     metoprolol tartrate (LOPRESSOR) 50 MG tablet TAKE 1 TABLET BY MOUTH TWICE A DAY   omeprazole (PRILOSEC) 40 MG capsule TAKE 1 CAPSULE (40 MG TOTAL) BY MOUTH DAILY. KEEP OV.   predniSONE (DELTASONE) 10 MG tablet Take 4 tablets (40 mg total) by mouth daily for 2 days, THEN 3 tablets (30 mg total) daily for 2 days, THEN 2 tablets (20 mg total) daily for 2 days, THEN 1 tablet (10 mg total) daily for 2 days.   Probiotic Product (PHILLIPS COLON HEALTH) CAPS Take 1 capsule by mouth daily.   ipratropium-albuterol (DUONEB) 0.5-2.5 (3) MG/3ML SOLN Take 3 mLs by nebulization 2 (two) times daily. And every 4 hours as needed for SOB or wheezing (Patient not taking: Reported on 09/06/2023)   No facility-administered encounter medications on file as of 09/06/2023.    Allergies as of 09/06/2023   (No Known Allergies)    Past Medical History:  Diagnosis Date   Anemia    CAD (coronary artery disease)    12/29/2011 normal R/S nuclear study;08/05/2010 cath smooth 50% ostial left main 60% distal left main, 60% ca+ smooth prox LAD, 70% ramus intermed., 90% prox. near ostial, 80% prox. CX, diffuse 30%prox. RCA, 95% eccentric stenosis mid RCA 5o0-90%distal RCA; 50% distal aortic stenosis   Carotid artery occlusion 08/04/2010   Right ICA 50-69% reduction;Left ICA 70-99% reduction   COPD (chronic obstructive pulmonary disease) (HCC)    Diabetes mellitus without complication (HCC)    H/O discoid lupus erythematosus  H/O discoid lupus erythematosus    Heart murmur    Echo:  Mitral regurg. 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 Early and Dorris Fetch    Past Surgical History:  Procedure Laterality Date   CAROTID ENDARTERECTOMY  08/07/2010   CHOLECYSTECTOMY  1991   gall bladder   CORONARY ARTERY BYPASS GRAFT  08/07/2010   ENDARTERECTOMY Right 08/03/2019   Procedure: ENDARTERECTOMY RIGHT CAROTID;  Surgeon: Larina Earthly, MD;  Location: [facility] OR;  Service: Vascular;  Laterality:  Right;   LEFT HEART CATH AND CORS/GRAFTS ANGIOGRAPHY N/A 05/27/2020   Procedure: LEFT HEART CATH AND CORS/GRAFTS ANGIOGRAPHY;  Surgeon: Lyn Records, MD;  Location: [facility] INVASIVE CV LAB;  Service: Cardiovascular;  Laterality: N/A;   PATCH ANGIOPLASTY Right 08/03/2019   Procedure: Patch Angioplasty using Hemashield Platinum Finesse.;  Surgeon: Larina Earthly, MD;  Location: [facility] OR;  Service: Vascular;  Laterality: Right;   RIGHT/LEFT HEART CATH AND CORONARY/GRAFT ANGIOGRAPHY N/A 08/30/2023   Procedure: RIGHT/LEFT HEART CATH AND CORONARY/GRAFT ANGIOGRAPHY;  Surgeon: Elder Negus, MD;  Location: [facility] INVAS[facility] CV [facility];  Service: Cardiovascular;  Laterality: N/A;   TONSILLECTOMY  1976   TOOTH EXTRACTION  Jan. and Aug.  2015    Family History  Problem Relation Age of Onset   Hypertension Mother    Diabetes Mother    Heart disease Mother        After age 74   Tuberculosis Father     Social History   Socioeconomic History   Marital status: Divorced    Spouse name: Not on file   Number of children: 3   Years of education: 12 + 3   Highest education level: Not on file  Occupational History   Occupation: RETIRED    Comment: MEDICAL TECHNICIAN  Tobacco Use   Smoking status: Former    Current packs/day: 0.00    Types: Cigarettes    Quit date: 08/07/2010    Years since quitting: 13.0   Smokeless tobacco: Never  Vaping Use   Vaping status: Never Used  Substance and Sexual Activity   Alcohol use: Yes    Comment: 1 glass of wine weekly   Drug use: No   Sexual activity: Not Currently  Other Topics Concern   Not on file  Social History Narrative   Not on file   Social Determinants of Health   Financial Resource Strain: Not on file  Food Insecurity: No Food Insecurity (08/25/2023)   Hunger Vital Sign    Worried About Running Out of Food in the Last Year: Never true    Ran Out of Food in the Last Year: Never true  Transportation Needs: No Transportation Needs (08/25/2023)   PRAPARE -  Administrator, Civil Service (Medical): No    Lack of Transportation (Non-Medical): No  Physical Activity: Not on file  Stress: Not on file  Social Connections: Not on file  Intimate Partner Violence: Not At Risk (08/25/2023)   Humiliation, Afraid, Rape, and Kick questionnaire    Fear of Current or Ex-Partner: No    Emotionally Abused: No    Physically Abused: No    Sexually Abused: No    Review of Systems  Constitutional:  Negative for fatigue.  Respiratory:  Positive for cough and shortness of breath.   Psychiatric/Behavioral:  Negative for sleep disturbance.     Vitals:   09/06/23 1549  BP: (!) 160/60  Pulse:  74  SpO2: 95%     Physical Exam Constitutional:      Appearance: Normal appearance.  HENT:     Head: Normocephalic.     Nose: Nose normal.     Mouth/Throat:     Mouth: Mucous membranes are moist.  Cardiovascular:     Rate and Rhythm: Normal rate and regular rhythm.     Heart sounds: No murmur heard.    No friction rub.  Pulmonary:     Breath sounds: Rales present.  Musculoskeletal:     Cervical back: No rigidity or tenderness.  Neurological:     Mental Status: He is alert.  Psychiatric:        Mood and Affect: Mood normal.    Data Reviewed: Recent CT scan during hospitalization reviewed with the patient showing multifocal infiltrate consistent with pneumonia  Recent hospitalization records reviewed  Assessment:  Recent hospitalization for hypoxemic respiratory failure  Recent COVID infection  Coronary artery disease  Chronic obstructive pulmonary disease with exacerbation  Type 2 diabetes    Plan/Recommendations: Bronchodilators  May discontinue oxygen supplementation as saturations have improved  Obtain chest x-ray today to assess-evaluation of multifocal infiltrates -I did review the chest x-ray which shows prominent bronchovascular markings  Continue activity as tolerated  Follow-up in 6 weeks  He is on a tapering  dose of prednisone  Encouraged to call us with significant concerns  Virl Diamond MD [facility] 09/06/2023, 8:44 PM  CC: Olive Bass, MD

## 2023-09-07 NOTE — Telephone Encounter (Signed)
Provided DX codes j44.1 and J96.01 to pharmacy and pharmacy stated they went through. NFN.

## 2023-09-26 ENCOUNTER — Telehealth: Payer: Self-pay

## 2023-09-26 NOTE — Telephone Encounter (Signed)
Patient had called and left a message on the triage line stating he is having issues again with his lower extremities and wanted to be seen.  After reviewing the chart and verifying patient with two identifiers I spoke with patient. He is having increased redness, swelling and pain in his lower extremities, especially his right leg. He stated that he has been wearing the compression at times and elevating his legs as recommended by Dr. Edilia Bo. I explained to him that Dr. Edilia Bo had retired and that I would have to reach out to one of the other providers in the office to see what the recommendations will be.  I reached out to Dr. Theresa Mulligan, he responded saying patient should follow up and instead of having the reflux ultrasound done again he would like the patient to have a CT Venogram done.  I called patient back and let him know this and that it would be scheduled within the next couple weeks. I gave him an appointmetn with Dr. Hetty Blend to follow up after he had this done. I told the patient in the mean time if anything gets worse to give [place] a call but to continue with elevation and wearing the compression. Patient is in agreement with this plan.

## 2023-09-30 ENCOUNTER — Other Ambulatory Visit: Payer: Self-pay

## 2023-09-30 DIAGNOSIS — I872 Venous insufficiency (chronic) (peripheral): Secondary | ICD-10-CM

## 2023-10-17 ENCOUNTER — Telehealth: Payer: Self-pay | Admitting: Cardiovascular Disease

## 2023-10-17 NOTE — Telephone Encounter (Signed)
*  STAT* If patient is at the pharmacy, call can be transferred to refill team.   1. Which medications need to be refilled? (please list name of each medication and dose if known) metoprolol tartrate (LOPRESSOR) 50 MG tablet   2. Which pharmacy/location (including street and city if local pharmacy) is medication to be sent to? [id number] [place] [place] - [address]    3. Do they need a 30 day or 90 day supply? 90 day

## 2023-10-18 MED ORDER — METOPROLOL TARTRATE 50 MG PO TABS: 50.0000 mg | ORAL_TABLET | Freq: Two times a day (BID) | ORAL | 0 refills | Status: DC

## 2023-10-18 NOTE — Telephone Encounter (Signed)
[facility]

## 2023-10-21 DIAGNOSIS — I340 Nonrheumatic mitral (valve) insufficiency: Secondary | ICD-10-CM | POA: Diagnosis not present

## 2023-10-21 DIAGNOSIS — I361 Nonrheumatic tricuspid (valve) insufficiency: Secondary | ICD-10-CM | POA: Diagnosis not present

## 2023-10-24 ENCOUNTER — Other Ambulatory Visit: Payer: Medicare Other

## 2023-10-26 ENCOUNTER — Other Ambulatory Visit: Payer: Medicare Other

## 2023-10-28 ENCOUNTER — Ambulatory Visit: Payer: Medicare Other | Admitting: Vascular Surgery

## 2023-10-30 ENCOUNTER — Other Ambulatory Visit: Payer: Self-pay | Admitting: Cardiovascular Disease

## 2023-11-02 ENCOUNTER — Encounter: Payer: Self-pay | Admitting: Cardiovascular Disease

## 2023-11-02 ENCOUNTER — Ambulatory Visit: Payer: Medicare Other | Attending: Cardiovascular Disease | Admitting: Cardiovascular Disease

## 2023-11-02 VITALS — BP 148/60 | HR 78 | Ht 65.0 in | Wt 156.4 lb

## 2023-11-02 DIAGNOSIS — Z951 Presence of aortocoronary bypass graft: Secondary | ICD-10-CM | POA: Insufficient documentation

## 2023-11-02 DIAGNOSIS — I10 Essential (primary) hypertension: Secondary | ICD-10-CM | POA: Diagnosis present

## 2023-11-02 DIAGNOSIS — E785 Hyperlipidemia, unspecified: Secondary | ICD-10-CM | POA: Insufficient documentation

## 2023-11-02 DIAGNOSIS — E1169 Type 2 diabetes mellitus with other specified complication: Secondary | ICD-10-CM | POA: Diagnosis present

## 2023-11-02 MED ORDER — AMLODIPINE BESYLATE 5 MG PO TABS: 5.0000 mg | ORAL_TABLET | Freq: Every day | ORAL | 3 refills | Status: DC

## 2023-11-02 MED ORDER — METOPROLOL TARTRATE 50 MG PO TABS: 50.0000 mg | ORAL_TABLET | Freq: Two times a day (BID) | ORAL | 3 refills | Status: DC

## 2023-11-02 MED ORDER — LOSARTAN POTASSIUM 50 MG PO TABS: 50.0000 mg | ORAL_TABLET | Freq: Every day | ORAL | 3 refills | Status: DC

## 2023-11-02 NOTE — Assessment & Plan Note (Signed)
History of essential hypertension blood pressure measured today at 148/60.  He is on amlodipine, losartan and metoprolol.

## 2023-11-02 NOTE — Assessment & Plan Note (Signed)
History of CAD status post coronary artery bypass grafting by Dr. Andrey Spearman in 2011.  Patient was recently hospitalized with COVID-pneumonia and type II non-STEMI.  Doing cardiac catheterization/right and left, Dr. Rosemary Holms  08/30/2023 revealing an occluded LAD, circumflex and high-grade native RCA with patent grafts including a LIMA to the LAD, vein to ramus branch and vein to the PDA and PLA sequentially.  He denies chest pain.

## 2023-11-02 NOTE — Assessment & Plan Note (Signed)
History of hyperlipidemia on statin therapy with lipid profile performed 05/27/2020 revealing total cholesterol 131, LDL of 74 and HDL of 28.

## 2023-11-02 NOTE — Assessment & Plan Note (Signed)
History of carotid artery disease status post left carotid endarterectomy performed concomitantly with his bypass surgery by Dr. Arbie Cookey in 2011.  His most recent carotid Dopplers performed 07/16/2021 revealed his endarterectomy daily patent.

## 2023-11-02 NOTE — Addendum Note (Signed)
Addended by: Bernita Buffy on: 11/02/2023 01:47 PM   Modules accepted: Orders

## 2023-11-02 NOTE — Progress Notes (Signed)
11/02/2023 Thomas Gray   June 11, 1941  [id number]  Primary Physician Sol Passer Doris Cheadle, MD Primary Cardiologist: Runell Gess MD Milagros Loll, [place], [place]  HPI:  Thomas Gray is a 82 y.o.   moderately overweight divorced Caucasian male father of 3 sons, grandfather to 5 granddaughters who was formerly a patient of Dr. Susa Griffins. I last saw him  in the office 11/04/2022.  His primary care physician is Dr. Dina Rich in [place]. His cardiac risk factor profile is significant for 50 pack years of tobacco abuse having quit 4 years ago at the time of his coronary artery bypass grafting. History of hypertension and hyperlipidemia. There is no family history. He has never had a heart attack or stroke. He denies chest pain or shortness of breath. He had coronary artery bypass grafting x4 by Dr. Andrey Spearman in 2011 concomitant left carotid endarterectomy performed by Dr. Gretta Began. A Myoview stress test performed in 2013 was nonischemic. He denies chest pain or shortness of breath. He apparently recently had carotid Dopplers 11/27/2018 that showed a widely patent left endarterectomy site with at least moderate to moderately severe right ICA stenosis representing some mild progression.     He exercises at [facility] wellness program at least 5 days a week without symptoms.  His normal blood pressures there are 130/60 with a pulse of 65. Has had mild progression of his carotid artery by duplex ultrasound followed by Dr. .Arbie Cookey.  He had a negative Myoview stress test done for preoperative clearance 06/15/2019 and ultimately underwent elective right carotid endarterectomy by Dr. Arbie Cookey 2 months later.   When I saw him last he was complaining of some dyspnea and chest pain.  A Myoview stress test performed 05/16/2020 showed anteroapical scar without ischemia which is new since his prior stress test performed 06/15/2019.  Based on this he underwent cardiac catheterization by Dr. Katrinka Blazing 05/27/2020  revealing patent grafts with an occluded nondominant circumflex and normal LV function.  His hemoglobin at that time was 9.2 which ultimately came up with iron repletion therapy to 11.2 and his symptoms completely resolved.  Since I saw him a year ago he was hospitalized in October with COVID-pneumonia and respiratory failure.  He had troponins that went up to 3000 and thought to be a "type II MI.  He underwent right left heart cath by Dr. Rosemary Holms revealing patent grafts with compensated filling pressures.  A 2D echo performed during that hospitalization revealed normal LV systolic function with moderately elevated pulmonary artery pressures and mild to moderate MR and TR.  He apparently was discharged home and readmitted to [facility] with congestive heart failure.  He is recovering from this.  He currently denies chest pain or shortness of breath.  Current Meds  Medication Sig   amLODipine (NORVASC) 5 MG tablet TAKE 1 TABLET BY MOUTH EVERY DAY   aspirin EC 81 MG tablet Take 81 mg by mouth every evening.   atorvastatin (LIPITOR) 40 MG tablet Take 1 tablet (40 mg total) by mouth every evening.   budesonide (PULMICORT) 0.25 MG/2ML nebulizer solution Take 2 mLs (0.25 mg total) by nebulization 2 (two) times daily.   cetirizine (ZYRTEC) 10 MG tablet Take 10 mg by mouth daily as needed for allergies or rhinitis.   FARXIGA 10 MG TABS tablet Take 10 mg by mouth every morning.   furosemide (LASIX) 40 MG tablet Take 40 mg by mouth daily.   ipratropium-albuterol (DUONEB) 0.5-2.5 (3) MG/3ML SOLN Take  3 mLs by nebulization 2 (two) times daily. And every 4 hours as needed for SOB or wheezing   losartan (COZAAR) 50 MG tablet Take 1 tablet (50 mg total) by mouth daily. Please be sure to make your appointment in December for further refills.   metFORMIN (GLUCOPHAGE) 500 MG tablet Take 500 mg by mouth 2 (two) times daily with a meal.    metoprolol tartrate (LOPRESSOR) 50 MG tablet Take 1 tablet (50 mg  total) by mouth 2 (two) times daily.   omeprazole (PRILOSEC) 40 MG capsule TAKE 1 CAPSULE (40 MG TOTAL) BY MOUTH DAILY. KEEP OV.   Probiotic Product (PHILLIPS COLON HEALTH) CAPS Take 1 capsule by mouth daily.     No Known Allergies  Social History   Socioeconomic History   Marital status: Divorced    Spouse name: Not on file   Number of children: 3   Years of education: 12 + 3   Highest education level: Not on file  Occupational History   Occupation: RETIRED    Comment: MEDICAL TECHNICIAN  Tobacco Use   Smoking status: Former    Current packs/day: 0.00    Types: Cigarettes    Quit date: 08/07/2010    Years since quitting: 13.2   Smokeless tobacco: Never  Vaping Use   Vaping status: Never Used  Substance and Sexual Activity   Alcohol use: Yes    Comment: 1 glass of wine weekly   Drug use: No   Sexual activity: Not Currently  Other Topics Concern   Not on file  Social History Narrative   Not on file   Social Determinants of Health   Financial Resource Strain: Not on file  Food Insecurity: Low Risk  (10/17/2023)   Received from [facility]   Hunger Vital Sign    Worried About Running Out of Food in the Last Year: Never true    Ran Out of Food in the Last Year: Never true  Transportation Needs: No Transportation Needs (10/17/2023)   Received from [facility]    In the past 12 months, has lack of reliable transportation kept you from medical appointments, meetings, work or from getting things needed for daily living? : No  Physical Activity: Not on file  Stress: Not on file  Social Connections: Not on file  Intimate Partner Violence: Not At Risk (08/25/2023)   Humiliation, Afraid, Rape, and Kick questionnaire    Fear of Current or Ex-Partner: No    Emotionally Abused: No    Physically Abused: No    Sexually Abused: No     Review of Systems: General: negative for chills, fever, night sweats or weight changes.  Cardiovascular: negative for  chest pain, dyspnea on exertion, edema, orthopnea, palpitations, paroxysmal nocturnal dyspnea or shortness of breath Dermatological: negative for rash Respiratory: negative for cough or wheezing Urologic: negative for hematuria Abdominal: negative for nausea, vomiting, diarrhea, bright red blood per rectum, melena, or hematemesis Neurologic: negative for visual changes, syncope, or dizziness All other systems reviewed and are otherwise negative except as noted above.    Blood pressure (!) 148/60, pulse 78, height 5\' 5"  (1.651 m), weight 156 lb 6.4 oz (70.9 kg), SpO2 98%.  General appearance: alert and no distress Neck: no adenopathy, no carotid bruit, no JVD, supple, symmetrical, trachea midline, and thyroid not enlarged, symmetric, no tenderness/mass/nodules Lungs: clear to auscultation bilaterally Heart: regular rate and rhythm, S1, S2 normal, no murmur, click, rub or gallop Extremities: extremities normal, atraumatic, no cyanosis  or edema Pulses: 2+ and symmetric Skin: Skin color, texture, turgor normal. No rashes or lesions Neurologic: Grossly normal  EKG not performed today.      ASSESSMENT AND PLAN:   S/P CABG x 4 History of CAD status post coronary artery bypass grafting by Dr. Andrey Spearman in 2011.  Patient was recently hospitalized with COVID-pneumonia and type II non-STEMI.  Doing cardiac catheterization/right and left, Dr. Rosemary Holms  08/30/2023 revealing an occluded LAD, circumflex and high-grade native RCA with patent grafts including a LIMA to the LAD, vein to ramus branch and vein to the PDA and PLA sequentially.  He denies chest pain.  Essential hypertension, benign History of essential hypertension blood pressure measured today at 148/60.  He is on amlodipine, losartan and metoprolol.  Hyperlipidemia associated with type 2 diabetes mellitus (HCC) History of hyperlipidemia on statin therapy with lipid profile performed 05/27/2020 revealing total cholesterol 131, LDL  of 74 and HDL of 28.  Occlusion and stenosis of carotid artery History of carotid artery disease status post left carotid endarterectomy performed concomitantly with his bypass surgery by Dr. Arbie Cookey in 2011.  His most recent carotid Dopplers performed 07/16/2021 revealed his endarterectomy daily patent.     Runell Gess MD FACP,FACC,FAHA, [facility] 11/02/2023 1:39 PM

## 2023-11-02 NOTE — Patient Instructions (Signed)
Medication Instructions:  Your physician recommends that you continue on your current medications as directed. Please refer to the Current Medication list given to you today.  *If you need a refill on your cardiac medications before your next appointment, please call your pharmacy*    Follow-Up: At [facility], you and your health needs are our priority.  As part of our continuing mission to provide you with exceptional heart care, we have created designated Provider Care Teams.  These Care Teams include your primary Cardiologist (physician) and Advanced Practice Providers (APPs -  Physician Assistants and Nurse Practitioners) who all work together to provide you with the care you need, when you need it.  We recommend signing up for the patient portal called "MyChart".  Sign up information is provided on this After Visit Summary.  MyChart is used to connect with patients for Virtual Visits (Telemedicine).  Patients are able to view lab/test results, encounter notes, upcoming appointments, etc.  Non-urgent messages can be sent to your provider as well.   To learn more about what you can do with MyChart, go to [email or web address].    Your next appointment:   3 month(s)  Provider:   Micah Flesher, PA-C, Marjie Skiff, PA-C, Robet Leu, PA-C, Azalee Course, PA-C, or Bernadene Person, NP       Then, Nanetta Batty, MD will plan to see you again in 12 month(s).

## 2023-11-18 ENCOUNTER — Ambulatory Visit: Payer: Medicare Other | Admitting: Pulmonary Disease

## 2023-11-18 VITALS — BP 187/70 | HR 77 | Ht 65.0 in | Wt 157.4 lb

## 2023-11-18 DIAGNOSIS — R0602 Shortness of breath: Secondary | ICD-10-CM | POA: Diagnosis not present

## 2023-11-18 DIAGNOSIS — Z8616 Personal history of COVID-19: Secondary | ICD-10-CM

## 2023-11-18 NOTE — Progress Notes (Unsigned)
Thomas Gray    [id number]    03-17-1941  Primary Care Physician:Dough, Doris Cheadle, MD  Referring Physician: Olive Bass, MD [address] [place],  [place] [address]  Chief complaint:   Patient being seen for follow-up   HPI:  Was recently hospitalized for respiratory failure, COVID infection Hypoxia with elevated troponin  There was significant elevation of his troponin, had a cardiac catheterization showing no significant obstruction of his coronaries.  S/p CABG x 4 previously-stents were patent during recent catheterization Past smoking history  Will exposed to his son who had had COVID Is vaccinated has had 5 shots previously  Was discharged on home oxygen but has been checking his oxygen frequently at home and saturations run about 95%  Denies any chest pains or chest discomfort Activity level has come back to normal  Was not having any respiratory complaint on 0.2 recent infection  Has a history of type 2 diabetes, history of obstructive lung disease, peripheral vascular disease, hyperlipidemia, hypertension  Able to tolerate activities without limitations  Outpatient Encounter Medications as of 11/18/2023  Medication Sig  . amLODipine (NORVASC) 5 MG tablet Take 1 tablet (5 mg total) by mouth daily.  Marland Kitchen aspirin EC 81 MG tablet Take 81 mg by mouth every evening.  Marland Kitchen atorvastatin (LIPITOR) 40 MG tablet Take 1 tablet (40 mg total) by mouth every evening.  . budesonide (PULMICORT) 0.25 MG/2ML nebulizer solution Take 2 mLs (0.25 mg total) by nebulization 2 (two) times daily.  . cetirizine (ZYRTEC) 10 MG tablet Take 10 mg by mouth daily as needed for allergies or rhinitis.  Marland Kitchen FARXIGA 10 MG TABS tablet Take 10 mg by mouth every morning.  . furosemide (LASIX) 40 MG tablet Take 40 mg by mouth daily.  Marland Kitchen ipratropium-albuterol (DUONEB) 0.5-2.5 (3) MG/3ML SOLN Take 3 mLs by nebulization 2 (two) times daily. And every 4 hours as needed for SOB or wheezing  . losartan  (COZAAR) 50 MG tablet Take 1 tablet (50 mg total) by mouth daily.  . metFORMIN (GLUCOPHAGE) 500 MG tablet Take 500 mg by mouth 2 (two) times daily with a meal.   . metoprolol tartrate (LOPRESSOR) 50 MG tablet Take 1 tablet (50 mg total) by mouth 2 (two) times daily.  Marland Kitchen omeprazole (PRILOSEC) 40 MG capsule TAKE 1 CAPSULE (40 MG TOTAL) BY MOUTH DAILY. KEEP OV.  . Probiotic Product (PHILLIPS COLON HEALTH) CAPS Take 1 capsule by mouth daily.   No facility-administered encounter medications on file as of 11/18/2023.    Allergies as of 11/18/2023  . (No Known Allergies)    Past Medical History:  Diagnosis Date  . Anemia   . CAD (coronary artery disease)    12/29/2011 normal R/S nuclear study;08/05/2010 cath smooth 50% ostial left main 60% distal left main, 60% ca+ smooth prox LAD, 70% ramus intermed., 90% prox. near ostial, 80% prox. CX, diffuse 30%prox. RCA, 95% eccentric stenosis mid RCA 5o0-90%distal RCA; 50% distal aortic stenosis  . 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 COPD (chronic obstructive pulmonary disease) (HCC)   . Diabetes mellitus without complication (HCC)   . H/O discoid lupus erythematosus   . H/O discoid lupus erythematosus   . Heart murmur    Echo:  Mitral regurg. EF 55-65%  . Hyperlipidemia   . Hypertension   . 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 Early and Dorris Fetch    Past Surgical History:  Procedure Laterality Date  . CAROTID ENDARTERECTOMY  08/07/2010  . CHOLECYSTECTOMY  1991   gall bladder  . CORONARY ARTERY BYPASS GRAFT  08/07/2010  . ENDARTERECTOMY Right 08/03/2019   Procedure: ENDARTERECTOMY RIGHT CAROTID;  Surgeon: Larina Earthly, MD;  Location: [facility] OR;  Service: Vascular;  Laterality: Right;  . LEFT HEART CATH AND CORS/GRAFTS ANGIOGRAPHY N/A 05/27/2020   Procedure: LEFT HEART CATH AND CORS/GRAFTS ANGIOGRAPHY;  Surgeon: Lyn Records, MD;  Location: [facility] INVASIVE CV LAB;  Service: Cardiovascular;  Laterality:  N/A;  . PATCH ANGIOPLASTY Right 08/03/2019   Procedure: Patch Angioplasty using Hemashield Platinum Finesse.;  Surgeon: Larina Earthly, MD;  Location: [facility] OR;  Service: Vascular;  Laterality: Right;  . RIGHT/LEFT HEART CATH AND CORONARY/GRAFT ANGIOGRAPHY N/A 08/30/2023   Procedure: RIGHT/LEFT HEART CATH AND CORONARY/GRAFT ANGIOGRAPHY;  Surgeon: Elder Negus, MD;  Location: [facility] INVASIVE CV LAB;  Service: Cardiovascular;  Laterality: N/A;  . TONSILLECTOMY  1976  . TOOTH EXTRACTION  Jan. and Aug.  2015    Family History  Problem Relation Age of Onset  . Hypertension Mother   . Diabetes Mother   . Heart disease Mother        After age 72  . Tuberculosis Father     Social History   Socioeconomic History  . Marital status: Divorced    Spouse name: Not on file  . Number of children: 3  . Years of education: 12 + 3  . Highest education level: Not on file  Occupational History  . Occupation: RETIRED    Comment: MEDICAL TECHNICIAN  Tobacco Use  . Smoking status: Former    Current packs/day: 0.00    Types: Cigarettes    Quit date: 08/07/2010    Years since quitting: 13.2  . Smokeless tobacco: Never  Vaping Use  . Vaping status: Never Used  Substance and Sexual Activity  . Alcohol use: Yes    Comment: 1 glass of wine weekly  . Drug use: No  . Sexual activity: Not Currently  Other Topics Concern  . Not on file  Social History Narrative  . Not on file   Social Drivers of Health   Financial Resource Strain: Not on file  Food Insecurity: Low Risk  (10/17/2023)   Received from [facility]   Hunger Vital Sign   . Worried About Programme researcher, broadcasting/film/video in the Last Year: Never true   . Ran Out of Food in the Last Year: Never true  Transportation Needs: No Transportation Needs (10/17/2023)   Received from [facility]   . In the past 12 months, has lack of reliable transportation kept you from medical appointments, meetings, work or from getting things needed  for daily living? : No  Physical Activity: Not on file  Stress: Not on file  Social Connections: Not on file  Intimate Partner Violence: Not At Risk (08/25/2023)   Humiliation, Afraid, Rape, and Kick questionnaire   . Fear of Current or Ex-Partner: No   . Emotionally Abused: No   . Physically Abused: No   . Sexually Abused: No    Review of Systems  Constitutional:  Negative for fatigue.  Respiratory:  Positive for cough and shortness of breath.   Psychiatric/Behavioral:  Negative for sleep disturbance.     Vitals:   11/18/23 1013  BP: (!) 187/70  Pulse: 77  SpO2: 96%     Physical Exam Constitutional:      Appearance: Normal appearance.  HENT:  Head: Normocephalic.     Nose: Nose normal.     Mouth/Throat:     Mouth: Mucous membranes are moist.  Cardiovascular:     Rate and Rhythm: Normal rate and regular rhythm.     Heart sounds: No murmur heard.    No friction rub.  Pulmonary:     Breath sounds: Rales present.  Musculoskeletal:     Cervical back: No rigidity or tenderness.  Neurological:     Mental Status: He is alert.  Psychiatric:        Mood and Affect: Mood normal.   Data Reviewed: Recent CT scan during hospitalization reviewed with the patient showing multifocal infiltrate consistent with pneumonia  Recent hospitalization records reviewed  Assessment:  Recent hospitalization for hypoxemic respiratory failure  Recent COVID infection  Coronary artery disease  Chronic obstructive pulmonary disease with exacerbation  Type 2 diabetes    Plan/Recommendations: Bronchodilators  May discontinue oxygen supplementation as saturations have improved  Obtain chest x-ray today to assess-evaluation of multifocal infiltrates -I did review the chest x-ray which shows prominent bronchovascular markings  Continue activity as tolerated  Follow-up in 6 weeks  He is on a tapering dose of prednisone  Encouraged to call us with significant  concerns  Virl Diamond MD [facility] 11/18/2023, 10:16 AM  CC: Olive Bass, MD

## 2023-11-18 NOTE — Patient Instructions (Signed)
Schedule for pulmonary function test  Order to DME [facility]-to discontinue oxygen supplementation  Use your inhalers as needed, you do not need to use them if you are not feeling short of breath  Prescription for pulmonary rehab does require that you have a breathing study that helps [place] categorize your COPD  Continue staying as active as possible  Follow-up in about 6 weeks

## 2023-12-16 ENCOUNTER — Other Ambulatory Visit (HOSPITAL_COMMUNITY): Payer: Self-pay

## 2023-12-16 ENCOUNTER — Telehealth: Payer: Self-pay | Admitting: Cardiovascular Disease

## 2023-12-16 MED ORDER — FUROSEMIDE 40 MG PO TABS
40.0000 mg | ORAL_TABLET | Freq: Every day | ORAL | 3 refills | Status: DC
  Filled 2023-12-16: qty 90, 90d supply, fill #0

## 2023-12-16 NOTE — Telephone Encounter (Signed)
*  STAT* If patient is at the pharmacy, call can be transferred to refill team.   1. Which medications need to be refilled? (please list name of each medication and dose if known) furosemide (LASIX) 40 MG tablet    2. Would you like to learn more about the convenience, safety, & potential cost savings by using the [facility]?      3. Are you open to using the [facility] (Type [facility].  ).   4. Which pharmacy/location (including street and city if local pharmacy) is medication to be sent to? [id number] - [place], [place] - [address]   5. Do they need a 30 day or 90 day supply? 90

## 2024-01-19 ENCOUNTER — Telehealth: Payer: Self-pay | Admitting: Cardiovascular Disease

## 2024-01-19 MED ORDER — FUROSEMIDE 40 MG PO TABS: 40.0000 mg | ORAL_TABLET | Freq: Every day | ORAL | 3 refills | Status: DC

## 2024-01-19 NOTE — Telephone Encounter (Signed)
 Called and spoke to pt regarding Lasix RX.  Lasix 40 mg once daily [facility] [place].  Phone [phone].  [facility] and verified.  It will be ready to get picked up later today.

## 2024-01-19 NOTE — Telephone Encounter (Signed)
 Pt c/o medication issue:  1. Name of Medication: furosemide (LASIX) 40 MG tablet   2. How are you currently taking this medication (dosage and times per day)?   3. Are you having a reaction (difficulty breathing--STAT)?   4. What is your medication issue? Patient is requesting call back to discuss getting this med called in as per Dr. Allyson Sabal.   90 day prescription sent to:  [id number] [place], [place] - [address]

## 2024-01-20 ENCOUNTER — Ambulatory Visit (HOSPITAL_BASED_OUTPATIENT_CLINIC_OR_DEPARTMENT_OTHER): Payer: Medicare Other | Admitting: Pulmonary Disease

## 2024-01-20 DIAGNOSIS — R0602 Shortness of breath: Secondary | ICD-10-CM

## 2024-01-20 LAB — PULMONARY FUNCTION TEST
DL/VA % pred: 103 %
DL/VA: 4.08 ml/min/mmHg/L
DLCO cor % pred: 77 %
DLCO cor: 15.89 ml/min/mmHg
DLCO unc % pred: 66 %
DLCO unc: 13.56 ml/min/mmHg
FEF 25-75 Post: 2.24 L/s
FEF 25-75 Pre: 1.75 L/s
FEF2575-%Change-Post: 27 %
FEF2575-%Pred-Post: 162 %
FEF2575-%Pred-Pre: 127 %
FEV1-%Change-Post: 4 %
FEV1-%Pred-Post: 97 %
FEV1-%Pred-Pre: 93 %
FEV1-Post: 2.09 L
FEV1-Pre: 1.99 L
FEV1FVC-%Change-Post: 5 %
FEV1FVC-%Pred-Pre: 109 %
FEV6-%Change-Post: -2 %
FEV6-%Pred-Post: 87 %
FEV6-%Pred-Pre: 89 %
FEV6-Post: 2.48 L
FEV6-Pre: 2.55 L
FEV6FVC-%Change-Post: 0 %
FEV6FVC-%Pred-Post: 109 %
FEV6FVC-%Pred-Pre: 108 %
FVC-%Change-Post: 0 %
FVC-%Pred-Post: 82 %
FVC-%Pred-Pre: 82 %
FVC-Post: 2.54 L
FVC-Pre: 2.55 L
Post FEV1/FVC ratio: 82 %
Post FEV6/FVC ratio: 100 %
Pre FEV1/FVC ratio: 78 %
Pre FEV6/FVC Ratio: 100 %
RV % pred: 80 %
RV: 1.97 L
TLC % pred: 75 %
TLC: 4.6 L

## 2024-01-20 NOTE — Progress Notes (Signed)
 Full PFT Performed Today

## 2024-01-20 NOTE — Patient Instructions (Signed)
 Full PFT Performed Today

## 2024-01-20 NOTE — Progress Notes (Signed)
 Cardiology Office Note:    Date:  01/31/2024   ID:  Jamaree, Hosier [birth year], MRN [id number]  PCP:  Olive Bass, MD   [facility] Providers Cardiologist:  Nanetta Batty, MD     Referring MD: Olive Bass, MD   Chief Complaint  Patient presents with   Follow-up    Pulmonary hypertension    History of Present Illness:    Thomas Gray is a 83 y.o. male with a hx of CAD s/p CABG x 4 2013 with concomitant left CEA, hypertension, hyperlipidemia, significant smoking history with 50-pack-year hx. he had a nuclear stress test 05/2019 for preoperative risk evaluation was nonischemic.  He had right CEA 07/2019.  A repeat nuclear stress test 05/16/2020 for dyspnea and chest pain showed normal perfusion, but anterior apical scar not seen on nuclear stress test in 2020.  He underwent repeat heart catheterization 05/2020 showing patent grafts.  Chest pain felt possibly due to anemia with a hemoglobin of 9.2 at that time.  Hemoglobin improved to 11.2 with iron.   Heart catheterization 08/30/2023 showed three-vessel disease with 4 out of 4 patent grafts.  Echocardiogram with preserved LVEF 55-60%, mild LVH, normal RV function but moderately elevated PASP, moderate TR.  He was last seen by Dr. Allyson Sabal 11/02/2023 and was doing well at that time.  He was exercising 5 days/week at the wellness program without symptoms.  He follows with Dr. Arbie Cookey for his carotid artery stenosis.  He presents today for routine cardiology follow-up. Elevated BP likely white coat hypertension. He was hospitalized at [facility] for covid pneumonia complicated by CHF Oct 2024. He became ill Nov 2024 --> hospitalized [facility] with bilateral PNA and CHF.   He has recovered and back to cardiac rehab. He denies cardiac issues. However, he has seen pulmonology following covid PNA, felt to have pulmonary hypertension. Repeat echo 10/21/23 showed moderate pulmonary hypertension. Will refer to advanced heart failure team to further  evaluate.    Past Medical History:  Diagnosis Date   Anemia    CAD (coronary artery disease)    12/29/2011 normal R/S nuclear study;08/05/2010 cath smooth 50% ostial left main 60% distal left main, 60% ca+ smooth prox LAD, 70% ramus intermed., 90% prox. near ostial, 80% prox. CX, diffuse 30%prox. 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 Early and Dorris Fetch    Past Surgical History:  Procedure Laterality Date   CAROTID ENDARTERECTOMY  08/07/2010   CHOLECYSTECTOMY  1991   gall bladder   CORONARY ARTERY BYPASS GRAFT  08/07/2010   ENDARTERECTOMY Right 08/03/2019   Procedure: ENDARTERECTOMY RIGHT CAROTID;  Surgeon: Larina Earthly, MD;  Location: [facility] OR;  Service: Vascular;  Laterality: Right;   LEFT HEART CATH AND CORS/GRAFTS ANGIOGRAPHY N/A 05/27/2020   Procedure: LEFT HEART CATH AND CORS/GRAFTS ANGIOGRAPHY;  Surgeon: Lyn Records, MD;  Location: [facility] INVASIVE CV LAB;  Service: Cardiovascular;  Laterality: N/A;   PATCH ANGIOPLASTY Right 08/03/2019   Procedure: Patch Angioplasty using Hemashield Platinum Finesse.;  Surgeon: Arbie Cookey,  Kristen Loader, MD;  Location: [facility] OR;  Service: Vascular;  Laterality: Right;   RIGHT/LEFT HEART CATH AND CORONARY/GRAFT ANGIOGRAPHY N/A 08/30/2023   Procedure: RIGHT/LEFT HEART CATH AND CORONARY/GRAFT ANGIOGRAPHY;  Surgeon: Elder Negus, MD;  Location: [facility] INVASIVE CV LAB;  Service: Cardiovascular;  Laterality: N/A;   TONSILLECTOMY  1976   TOOTH EXTRACTION  Jan. and Aug.  2015    Current Medications: Current Meds  Medication Sig   amLODipine  (NORVASC) 5 MG tablet Take 1 tablet (5 mg total) by mouth daily.   aspirin EC 81 MG tablet Take 81 mg by mouth every evening.   atorvastatin (LIPITOR) 40 MG tablet Take 1 tablet (40 mg total) by mouth every evening.   cetirizine (ZYRTEC) 10 MG tablet Take 10 mg by mouth daily as needed for allergies or rhinitis.   FARXIGA 10 MG TABS tablet Take 10 mg by mouth every morning.   furosemide (LASIX) 40 MG tablet Take 1 tablet (40 mg total) by mouth daily.   losartan (COZAAR) 50 MG tablet Take 1 tablet (50 mg total) by mouth daily.   metFORMIN (GLUCOPHAGE) 500 MG tablet Take 500 mg by mouth 2 (two) times daily with a meal.    metoprolol tartrate (LOPRESSOR) 50 MG tablet Take 1 tablet (50 mg total) by mouth 2 (two) times daily.   omeprazole (PRILOSEC) 40 MG capsule TAKE 1 CAPSULE (40 MG TOTAL) BY MOUTH DAILY. KEEP OV.   Probiotic Product (PHILLIPS COLON HEALTH) CAPS Take 1 capsule by mouth daily.     Allergies:   Patient has no known allergies.   Social History   Socioeconomic History   Marital status: Divorced    Spouse name: Not on file   Number of children: 3   Years of education: 12 + 3   Highest education level: Not on file  Occupational History   Occupation: RETIRED    Comment: MEDICAL TECHNICIAN  Tobacco Use   Smoking status: Former    Current packs/day: 0.00    Types: Cigarettes    Quit date: 08/07/2010    Years since quitting: 13.4   Smokeless tobacco: Never  Vaping Use   Vaping status: Never Used  Substance and Sexual Activity   Alcohol use: Yes    Comment: 1 glass of wine weekly   Drug use: No   Sexual activity: Not Currently  Other Topics Concern   Not on file  Social History Narrative   Not on file   Social Drivers of Health   Financial Resource Strain: Not on file  Food Insecurity: Low Risk  (12/08/2023)   Received from [facility]   Hunger Vital Sign    Worried About Running Out of Food in the Last Year: Never true    Ran Out of Food in the Last Year: Never  true  Transportation Needs: No Transportation Needs (12/08/2023)   Received from [facility]    In the past 12 months, has lack of reliable transportation kept you from medical appointments, meetings, work or from getting things needed for daily living? : No  Physical Activity: Not on file  Stress: Not on file  Social Connections: Not on file     Family History: The patient's family history includes Diabetes in his mother; Heart disease in his mother; Hypertension in his mother; Tuberculosis in his father.  ROS:   Please see the history of present illness.     All other systems reviewed and are negative.  EKGs/Labs/Other Studies Reviewed:  The following studies were reviewed today:  Cardiac Studies & Procedures   ______________________________________________________________________________________________ CARDIAC CATHETERIZATION  CARDIAC CATHETERIZATION 08/30/2023  Narrative Images from the original result were not included. Coronary and bypass graft angiography 08/30/2023: LM: No significant disease LAD: Mid occluded Lcx: Proximally occluded Ramus: Proximally subtotally occluded RCA: Distally occluded, with mid severe disease  LIMA-LAD: Patent, no significant disease SVG-PDA/PL: Patent, no significant disease SVG-ramus: Patent, no significant disease    Right heart catheterization 08/30/2023: RA: 1 mmHg RV: 45/0 mmHg PA: 49/14 mmHg, mPAP 28 mmHg PCW: 10 mmHg  CO: 8.4 L/min CI: 4.6 L/min/m2  Multivessel sever coronary artery disease Patent 3/3 grafts Compensated filling pressures Mild PH  NSTEMI likely type 2 MI  Manish Emiliano Dyer, MD  Findings Coronary Findings Diagnostic  Dominance: Right  Left Anterior Descending Prox LAD to Mid LAD lesion is 100% stenosed. Prox LAD to Mid LAD lesion is 100% stenosed.  First Diagonal Branch Vessel is small in size.  Second Septal Branch Vessel is small in size.  Ramus Intermedius Ramus  lesion is 99% stenosed.  Left Circumflex Vessel is small. Prox Cx to Mid Cx lesion is 100% stenosed.  First Obtuse Marginal Branch Vessel is small in size.  Second Obtuse Marginal Branch Vessel is small in size.  Right Coronary Artery There is severe diffuse disease throughout the vessel. Prox RCA lesion is 90% stenosed. Dist RCA lesion is 95% stenosed.  Right Posterior Atrioventricular Artery RPAV lesion is 100% stenosed.  Graft To Ramus  Sequential Graft To RPDA, 1st RPL  LIMA Graft To Mid LAD  Intervention  No interventions have been documented.   CARDIAC CATHETERIZATION  CARDIAC CATHETERIZATION 05/27/2020  Narrative  Severe native vessel CAD with total occlusion of proximal to mid LAD, total occlusion of the native circumflex, high-grade diffuse obstruction and a large ramus intermedius, and diffuse high-grade 99% stenosis in proximal and distal RCA.  Patent sequential saphenous vein graft to PDA and PL.  Patent saphenous vein graft to trifurcating ramus intermedius.  Patent LIMA to distal LAD.  Normal left ventricular systolic function.  LVEF 55%.  LVEDP between 15 and 20 mmHg.  RECOMMENDATIONS:   Further evaluation is needed by the primary team.  Preventive therapy.  Aggressive risk factor modification including blood pressure control.  Findings Coronary Findings Diagnostic  Dominance: Right  Left Anterior Descending Prox LAD to Mid LAD lesion is 100% stenosed. Prox LAD to Mid LAD lesion is 100% stenosed.  First Diagonal Branch Vessel is small in size.  Second Septal Branch Vessel is small in size.  Ramus Intermedius Ramus lesion is 90% stenosed.  Left Circumflex Vessel is small. Prox Cx to Mid Cx lesion is 100% stenosed.  First Obtuse Marginal Branch Vessel is small in size.  Second Obtuse Marginal Branch Vessel is small in size.  Right Coronary Artery There is severe diffuse disease throughout the vessel. Prox RCA lesion is  90% stenosed. Dist RCA lesion is 95% stenosed.  Right Posterior Atrioventricular Artery RPAV lesion is 100% stenosed.  Graft To Ramus  Sequential Graft To RPDA, 1st RPL  LIMA Graft To Mid LAD  Intervention  No interventions have been documented.   STRESS TESTS  MYOCARDIAL PERFUSION IMAGING 05/16/2020  Narrative  Nuclear stress EF: 65%.  The left ventricular ejection fraction is normal (55-65%).  ST segment depression was noted during stress.  Defect 1: There is a medium defect of severe severity present in the basal anterior and mid anterior location.  Findings consistent with prior myocardial  infarction.  This is an intermediate risk study.  Abnormal intermediate risk stress nuclear study with increased ST depression during Lexiscan infusion.  There appears to be a prior anterior infarct but no ischemia.  Gated ejection fraction 65% with normal wall motion.  Study was reviewed with Dr. Allyson Sabal prior to discharge and patient has been scheduled for follow-up.   ECHOCARDIOGRAM  ECHOCARDIOGRAM COMPLETE 08/26/2023  Narrative ECHOCARDIOGRAM REPORT    Patient Name:   JONNY DEARDEN Date of Exam: 08/26/2023 Medical Rec #:  [id number]   Height:       65.0 in Accession #:    [id number]  Weight:       162.3 lb Date of Birth:  1941/11/01   BSA:          1.810 m Patient Age:    41 years    BP:           145/73 mmHg Patient Gender: M           HR:           87 bpm. Exam Location:  Inpatient  Procedure: 2D Echo, Cardiac Doppler and Color Doppler  Indications:    NSTEMI  History:        Patient has prior history of Echocardiogram examinations, most recent 05/26/2020. CAD, Prior CABG, COPD and COVID. Chronic kidney disease; Risk Factors:Diabetes.  Sonographer:    Delcie Roch RDCS Referring Phys: 4 DAWOOD S ELGERGAWY   Sonographer Comments: Image acquisition challenging due to respiratory motion. IMPRESSIONS   1. Left ventricular ejection fraction, by estimation, is  55 to 60%. The left ventricle has normal function. The left ventricle has no regional wall motion abnormalities. There is mild concentric left ventricular hypertrophy. Left ventricular diastolic parameters are indeterminate. 2. Right ventricular systolic function is normal. The right ventricular size is normal. There is moderately elevated pulmonary artery systolic pressure. 3. Left atrial size was mildly dilated. 4. The mitral valve is normal in structure. Mild to moderate mitral valve regurgitation. No evidence of mitral stenosis. 5. Tricuspid valve regurgitation is moderate. 6. The aortic valve is normal in structure. Aortic valve regurgitation is trivial. Aortic valve sclerosis is present, with no evidence of aortic valve stenosis. 7. The inferior vena cava is normal in size with greater than 50% respiratory variability, suggesting right atrial pressure of 3 mmHg.  FINDINGS Left Ventricle: Left ventricular ejection fraction, by estimation, is 55 to 60%. The left ventricle has normal function. The left ventricle has no regional wall motion abnormalities. The left ventricular internal cavity size was normal in size. There is mild concentric left ventricular hypertrophy. Left ventricular diastolic parameters are indeterminate.  Right Ventricle: The right ventricular size is normal. No increase in right ventricular wall thickness. Right ventricular systolic function is normal. There is moderately elevated pulmonary artery systolic pressure. The tricuspid regurgitant velocity is 3.43 m/s, and with an assumed right atrial pressure of 3 mmHg, the estimated right ventricular systolic pressure is 50.1 mmHg.  Left [facility]: Left atrial size was mildly dilated.  Right [facility]: Right atrial size was normal in size.  Pericardium: There is no evidence of pericardial effusion.  Mitral Valve: The mitral valve is normal in structure. Mild to moderate mitral valve regurgitation. No evidence of mitral valve  stenosis.  Tricuspid Valve: The tricuspid valve is normal in structure. Tricuspid valve regurgitation is moderate . No evidence of tricuspid stenosis.  Aortic Valve: The aortic valve is normal in structure. Aortic valve regurgitation is trivial. Aortic valve sclerosis  is present, with no evidence of aortic valve stenosis.  Pulmonic Valve: The pulmonic valve was normal in structure. Pulmonic valve regurgitation is not visualized. No evidence of pulmonic stenosis.  Aorta: The aortic root is normal in size and structure.  Venous: The inferior vena cava is normal in size with greater than 50% respiratory variability, suggesting right atrial pressure of 3 mmHg.  IAS/Shunts: No atrial level shunt detected by color flow Doppler.  Additional Comments: A device lead is visualized.   LEFT VENTRICLE PLAX 2D LVIDd:         3.80 cm   Diastology LVIDs:         2.30 cm   LV e' medial:    5.55 cm/s LV PW:         1.20 cm   LV E/e' medial:  20.0 LV IVS:        1.20 cm   LV e' lateral:   6.53 cm/s LVOT diam:     1.80 cm   LV E/e' lateral: 17.0 LV SV:         45 LV SV Index:   25 LVOT Area:     2.54 cm   RIGHT VENTRICLE            IVC RV Basal diam:  2.90 cm    IVC diam: 2.00 cm RV S prime:     9.46 cm/s TAPSE (M-mode): 1.4 cm  LEFT ATRIUM             Index        RIGHT ATRIUM           Index LA diam:        4.40 cm 2.43 cm/m   RA Area:     14.50 cm LA Vol (A2C):   46.7 ml 25.80 ml/m  RA Volume:   32.90 ml  18.18 ml/m LA Vol (A4C):   67.6 ml 37.35 ml/m LA Biplane Vol: 57.6 ml 31.82 ml/m AORTIC VALVE LVOT Vmax:   88.30 cm/s LVOT Vmean:  60.600 cm/s LVOT VTI:    0.176 m  AORTA Ao Root diam: 3.10 cm Ao Asc diam:  3.30 cm  MITRAL VALVE                TRICUSPID VALVE MV Area (PHT): 4.31 cm     TR Peak grad:   47.1 mmHg MV Decel Time: 176 msec     TR Vmax:        343.00 cm/s MV E velocity: 111.00 cm/s MV A velocity: 57.50 cm/s   SHUNTS MV E/A ratio:  1.93         Systemic VTI:   0.18 m Systemic Diam: 1.80 cm  Kardie Tobb DO Electronically signed by Thomasene Ripple DO Signature Date/Time: 08/26/2023/1:38:07 PM    Final          ______________________________________________________________________________________________            Recent Labs: 08/31/2023: Hemoglobin 8.1; Platelets 229 09/01/2023: ALT 50; B Natriuretic Peptide 294.2; BUN 30; Creatinine, Ser 1.45; Magnesium 2.3; Potassium 4.0; Sodium 135  Recent Lipid Panel    Component Value Date/Time   CHOL 131 05/27/2020 0749   TRIG 143 05/27/2020 0749   HDL 28 (L) 05/27/2020 0749   CHOLHDL 4.7 05/27/2020 0749   VLDL 29 05/27/2020 0749   LDLCALC 74 05/27/2020 0749     Risk Assessment/Calculations:          Physical Exam:    VS:  BP (!) 186/62 (BP  Location: Right Arm, Patient Position: Sitting, Cuff Size: Normal)   Pulse 65   Ht 5\' 5"  (1.651 m)   Wt 164 lb 3.2 oz (74.5 kg)   SpO2 98%   BMI 27.32 kg/m     Wt Readings from Last 3 Encounters:  01/31/24 164 lb 3.2 oz (74.5 kg)  01/23/24 163 lb 3.2 oz (74 kg)  01/20/24 161 lb 6.4 oz (73.2 kg)     GEN:  Well nourished, well developed in no acute distress HEENT: Normal NECK: No JVD; No carotid bruits LYMPHATICS: No lymphadenopathy CARDIAC: RRR, no murmurs, rubs, gallops RESPIRATORY:  Clear to auscultation without rales, wheezing or rhonchi  ABDOMEN: Soft, non-tender, non-distended MUSCULOSKELETAL:  1+ pitting edema; No deformity  SKIN: Warm and dry NEUROLOGIC:  Alert and oriented x 3 PSYCHIATRIC:  Normal affect   ASSESSMENT:    1. S/P CABG x 4   2. CAD, multiple vessel   3. Primary hypertension   4. Hyperlipidemia with target LDL less than 70   5. Bilateral carotid artery stenosis   6. Pulmonary hypertension, unspecified (HCC)    PLAN:    In order of problems listed above:  CAD s/p CABG x 4 - CABG in 2011 - Reassuring and nuclear stress test 2020 - Last heart catheterization with Dr. Rosemary Holms 08/30/2023 revealing  three-vessel disease with patent LIMA-LAD, SVG-ramus, SVG-PDA and PLA sequentially -- no chest pain -- continue 81 mg ASA monotherapy   Hypertension White coat hypertension - Continue amlodipine, losartan, metoprolol -- SBP 118 this morning at cardiac rehab -- likely white coat hypertension -- he is compliant with medications, was controlled at cardiac rehab this morning   Pulmonary hypertension - mild in 2021, now moderate - he does have lower extremity swelling but does not want to increase lasix for now, better in the morning, uses compression stockings   Hyperlipidemia with LDL goal less than 70 DM 2 - LDL 74 when last checked in 2021 -- Dr. Sol Passer PCP is watching cholesterol -- LDL 61 on 11/2023   Carotid artery stenosis - Left carotid endarterectomy performed at the time of his CABG with Dr. Arbie Cookey in 2011 - Most recent carotid artery Dopplers in 2022 revealed patent endarterectomy -- no recent syncope, pre-syncope   Refer to AHF team for pulmonary hypertension.            Medication Adjustments/Labs and Tests Ordered: Current medicines are reviewed at length with the patient today.  Concerns regarding medicines are outlined above.  Orders Placed This Encounter  Procedures   AMB referral to CHF clinic   No orders of the defined types were placed in this encounter.   Patient Instructions  Medication Instructions:  NO CHANGES *If you need a refill on your cardiac medications before your next appointment, please call your pharmacy*   Follow-Up: At [facility], you and your health needs are our priority.  As part of our continuing mission to provide you with exceptional heart care, we have created designated Provider Care Teams.  These Care Teams include your primary Cardiologist (physician) and Advanced Practice Providers (APPs -  Physician Assistants and Nurse Practitioners) who all work together to provide you with the care you need, when you need  it.   Your next appointment:   4-5  month(s)  Provider:   Nanetta Batty, MD     Other Instructions           Signed, Marcelino Duster, PA  01/31/2024 12:46 PM   [facility]

## 2024-01-23 ENCOUNTER — Ambulatory Visit: Payer: Medicare Other | Admitting: Pulmonary Disease

## 2024-01-23 VITALS — BP 170/68 | HR 74 | Ht 65.0 in | Wt 163.2 lb

## 2024-01-23 DIAGNOSIS — I2729 Other secondary pulmonary hypertension: Secondary | ICD-10-CM

## 2024-01-23 DIAGNOSIS — R0602 Shortness of breath: Secondary | ICD-10-CM | POA: Diagnosis not present

## 2024-01-23 NOTE — Progress Notes (Signed)
 Thomas Gray    [id number]    21-Jan-1941  Primary Care Physician:Dough, Doris Cheadle, MD  Referring Physician: Olive Bass, MD [address] [place],  [place] [address]  Chief complaint:   Patient being seen for follow-up   HPI:  His breathing has been relatively stable History of COVID infection, respiratory failure,  Had a breathing study recently which was discussed during the visit today showing no significant obstruction, no significant bronchodilator response  Has been doing really well  History of CABG x 4 previously, recent cardiac cath showed stents are stable Past smoking history  Saturations have been good  Denies any chest pains or chest discomfort  Activity levels have been relatively good  Has a history of type 2 diabetes, history of obstructive lung disease, peripheral vascular disease, hyperlipidemia, hypertension  Able to tolerate activities without limitations  Outpatient Encounter Medications as of 01/23/2024  Medication Sig   amLODipine (NORVASC) 5 MG tablet Take 1 tablet (5 mg total) by mouth daily.   aspirin EC 81 MG tablet Take 81 mg by mouth every evening.   atorvastatin (LIPITOR) 40 MG tablet Take 1 tablet (40 mg total) by mouth every evening.   cetirizine (ZYRTEC) 10 MG tablet Take 10 mg by mouth daily as needed for allergies or rhinitis.   FARXIGA 10 MG TABS tablet Take 10 mg by mouth every morning.   furosemide (LASIX) 40 MG tablet Take 1 tablet (40 mg total) by mouth daily.   losartan (COZAAR) 50 MG tablet Take 1 tablet (50 mg total) by mouth daily.   metFORMIN (GLUCOPHAGE) 500 MG tablet Take 500 mg by mouth 2 (two) times daily with a meal.    metoprolol tartrate (LOPRESSOR) 50 MG tablet Take 1 tablet (50 mg total) by mouth 2 (two) times daily.   omeprazole (PRILOSEC) 40 MG capsule TAKE 1 CAPSULE (40 MG TOTAL) BY MOUTH DAILY. KEEP OV.   Probiotic Product (PHILLIPS COLON HEALTH) CAPS Take 1 capsule by mouth daily.   budesonide  (PULMICORT) 0.25 MG/2ML nebulizer solution Take 2 mLs (0.25 mg total) by nebulization 2 (two) times daily.   ipratropium-albuterol (DUONEB) 0.5-2.5 (3) MG/3ML SOLN Take 3 mLs by nebulization 2 (two) times daily. And every 4 hours as needed for SOB or wheezing   No facility-administered encounter medications on file as of 01/23/2024.    Allergies as of 01/23/2024   (No Known Allergies)    Past Medical History:  Diagnosis Date   Anemia    CAD (coronary artery disease)    12/29/2011 normal R/S nuclear study;08/05/2010 cath smooth 50% ostial left main 60% distal left main, 60% ca+ smooth prox LAD, 70% ramus intermed., 90% prox. near ostial, 80% prox. CX, diffuse 30%prox. 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 Early and Dorris Fetch    Past Surgical History:  Procedure Laterality Date   CAROTID ENDARTERECTOMY  08/07/2010   CHOLECYSTECTOMY  1991   gall bladder   CORONARY ARTERY BYPASS GRAFT  08/07/2010  ENDARTERECTOMY Right 08/03/2019   Procedure: ENDARTERECTOMY RIGHT CAROTID;  Surgeon: Larina Earthly, MD;  Location: [facility] OR;  Service: Vascular;  Laterality: Right;   LEFT HEART CATH AND CORS/GRAFTS ANGIOGRAPHY N/A 05/27/2020   Procedure: LEFT HEART CATH AND CORS/GRAFTS ANGIOGRAPHY;  Surgeon: Lyn Records, MD;  Location: [facility] INVASIVE CV LAB;  Service: Cardiovascular;  Laterality: N/A;   PATCH ANGIOPLASTY Right 08/03/2019   Procedure: Patch Angioplasty using Hem[employer] Platinum Finesse.;  Surgeon: Larina Earthly, MD;  Location: [facility] OR;  Service: Vascular;  Laterality: Right;    RIGHT/LEFT HEART CATH AND CORONARY/GRAFT ANGIOGRAPHY N/A 08/30/2023   Procedure: RIGHT/LEFT HEART CATH AND CORONARY/GRAFT ANGIOGRAPHY;  Surgeon: Elder Negus, MD;  Location: [facility] INVAS[facility] CV [facility];  Service: Cardiovascular;  Laterality: N/A;   TONSILLECTOMY  1976   TOOTH EXTRACTION  Jan. and Aug.  2015    Family History  Problem Relation Age of Onset   Hypertension Mother    Diabetes Mother    Heart disease Mother        After age 23   Tuberculosis Father     Social History   Socioeconomic History   Marital status: Divorced    Spouse name: Not on file   Number of children: 3   Years of education: 12 + 3   Highest education level: Not on file  Occupational History   Occupation: RETIRED    Comment: MEDICAL TECHNICIAN  Tobacco Use   Smoking status: Former    Current packs/day: 0.00    Types: Cigarettes    Quit date: 08/07/2010    Years since quitting: 13.4   Smokeless tobacco: Never  Vaping Use   Vaping status: Never Used  Substance and Sexual Activity   Alcohol use: Yes    Comment: 1 glass of wine weekly   Drug use: No   Sexual activity: Not Currently  Other Topics Concern   Not on file  Social History Narrative   Not on file   Social Drivers of Health   Financial Resource Strain: Not on file  Food Insecurity: Low Risk  (12/08/2023)   Received from [facility]   Hunger Vital Sign    Worried About Running Out of Food in the Last Year: Never true    Ran Out of Food in the Last Year: Never true  Transportation Needs: No Transportation Needs (12/08/2023)   Received from [facility]    In the past 12 months, has lack of reliable transportation kept you from medical appointments, meetings, work or from getting things needed for daily living? : No  Physical Activity: Not on file  Stress: Not on file  Social Connections: Not on file  Intimate Partner Violence: Not At Risk (08/25/2023)   Humiliation, Afraid, Rape, and Kick questionnaire    Fear  of Current or Ex-Partner: No    Emotionally Abused: No    Physically Abused: No    Sexually Abused: No    Review of Systems  Constitutional:  Negative for fatigue.  Respiratory:  Negative for cough and shortness of breath.   Psychiatric/Behavioral:  Negative for sleep disturbance.     Vitals:   01/23/24 1506 01/23/24 1507  BP: (!) 200/49 (!) 170/68  Pulse: 74   SpO2: 97%      Physical Exam Constitutional:      Appearance: Normal appearance.  HENT:     Head: Normocephalic.     Nose: Nose normal.     Mouth/Throat:     Mouth:  Mucous membranes are moist.  Eyes:     General: Scleral icterus present.  Cardiovascular:     Rate and Rhythm: Normal rate and regular rhythm.     Heart sounds: No murmur heard.    No friction rub.  Pulmonary:     Effort: No respiratory distress.     Breath sounds: No stridor. Rales present. No wheezing.  Musculoskeletal:     Cervical back: No rigidity or tenderness.  Neurological:     Mental Status: He is alert.  Psychiatric:        Mood and Affect: Mood normal.    Data Reviewed: Recent CT scan during hospitalization reviewed with the patient showing multifocal infiltrate consistent with pneumonia  Recent hospitalization records reviewed  PFT shows no obstruction, mild restriction, mild reduction in diffusing capacity  Assessment:  Recently treated for hypoxemic respiratory failure, recent COVID infection Symptoms continue to improve -Activity levels back to normal  History of coronary artery disease -Stable  Mild pulmonary hypertension on recent cardiac catheterization -Will be following up with cardiology in a few days  Chronic obstructive pulmonary disease with stable symptoms  Type 2 diabetes    Plan/Recommendations: Continue bronchodilators  No significant obstruction  Graded activities as tolerated  Follow-up in about 6 months  Encouraged to call with significant concerns  Discuss pulmonary hypertension with  cardiology  I spent 30 minutes dedicated to the care of this patient on the date of this encounter to include previsit review of records, face-to-face time with the patient discussing conditions above, post visit ordering of testing,ordering medications,independentlyinterpreting results, clinical documentation with electronic health record and communicated necessary findings to members of the patient's care team  Virl Diamond MD [facility] 01/23/2024, 3:19 PM  CC: Olive Bass, MD

## 2024-01-23 NOTE — Patient Instructions (Addendum)
 Your breathing study looks good  Continue breathing treatments  Continue regular exercises  The cardiologist should be able to discuss the pulmonary hypertension with you  Will see you in 6 months  Call us with significant concerns

## 2024-01-31 ENCOUNTER — Encounter: Payer: Self-pay | Admitting: Physician Assistant

## 2024-01-31 ENCOUNTER — Ambulatory Visit: Payer: Medicare Other | Attending: Physician Assistant | Admitting: Physician Assistant

## 2024-01-31 VITALS — BP 186/62 | HR 65 | Ht 65.0 in | Wt 164.2 lb

## 2024-01-31 DIAGNOSIS — I6523 Occlusion and stenosis of bilateral carotid arteries: Secondary | ICD-10-CM

## 2024-01-31 DIAGNOSIS — Z951 Presence of aortocoronary bypass graft: Secondary | ICD-10-CM

## 2024-01-31 DIAGNOSIS — E785 Hyperlipidemia, unspecified: Secondary | ICD-10-CM

## 2024-01-31 DIAGNOSIS — I10 Essential (primary) hypertension: Secondary | ICD-10-CM

## 2024-01-31 DIAGNOSIS — I272 Pulmonary hypertension, unspecified: Secondary | ICD-10-CM | POA: Diagnosis present

## 2024-01-31 DIAGNOSIS — I2510 Atherosclerotic heart disease of native coronary artery without angina pectoris: Secondary | ICD-10-CM

## 2024-01-31 NOTE — Patient Instructions (Addendum)
 Medication Instructions:  NO CHANGES *If you need a refill on your cardiac medications before your next appointment, please call your pharmacy*   Follow-Up: At [facility], you and your health needs are our priority.  As part of our continuing mission to provide you with exceptional heart care, we have created designated Provider Care Teams.  These Care Teams include your primary Cardiologist (physician) and Advanced Practice Providers (APPs -  Physician Assistants and Nurse Practitioners) who all work together to provide you with the care you need, when you need it.   Your next appointment:   4-5  month(s)  Provider:   Nanetta Batty, MD     Other Instructions

## 2024-04-12 ENCOUNTER — Encounter: Payer: Self-pay | Admitting: Cardiovascular Disease

## 2024-05-08 ENCOUNTER — Other Ambulatory Visit: Payer: Self-pay | Admitting: Cardiovascular Disease

## 2024-11-22 ENCOUNTER — Other Ambulatory Visit: Payer: Self-pay | Admitting: Cardiovascular Disease

## 2024-12-16 ENCOUNTER — Other Ambulatory Visit: Payer: Self-pay | Admitting: Cardiovascular Disease

## 2024-12-20 ENCOUNTER — Other Ambulatory Visit: Payer: Self-pay | Admitting: Cardiovascular Disease

## 2024-12-20 NOTE — Telephone Encounter (Signed)
 Pt scheduled for 01/16/25

## 2024-12-26 ENCOUNTER — Other Ambulatory Visit: Payer: Self-pay | Admitting: Cardiovascular Disease

## 2025-01-16 ENCOUNTER — Ambulatory Visit: Admitting: Cardiovascular Disease
